# Patient Record
Sex: Male | Born: 1935 | Race: White | Hispanic: No | Smoking: Former smoker
Health system: Southern US, Community
[De-identification: ages and names within clinical notes are randomized; demographics above are authoritative.]

## PROBLEM LIST (undated history)

## (undated) DIAGNOSIS — N183 Chronic kidney disease, stage 3 unspecified: Secondary | ICD-10-CM

## (undated) DIAGNOSIS — C61 Malignant neoplasm of prostate: Secondary | ICD-10-CM

## (undated) DIAGNOSIS — R609 Edema, unspecified: Secondary | ICD-10-CM

## (undated) DIAGNOSIS — M1990 Unspecified osteoarthritis, unspecified site: Secondary | ICD-10-CM

## (undated) DIAGNOSIS — Z9289 Personal history of other medical treatment: Secondary | ICD-10-CM

## (undated) DIAGNOSIS — K589 Irritable bowel syndrome without diarrhea: Secondary | ICD-10-CM

## (undated) DIAGNOSIS — H9190 Unspecified hearing loss, unspecified ear: Secondary | ICD-10-CM

## (undated) DIAGNOSIS — F0390 Unspecified dementia without behavioral disturbance: Secondary | ICD-10-CM

## (undated) DIAGNOSIS — I10 Essential (primary) hypertension: Secondary | ICD-10-CM

## (undated) DIAGNOSIS — E785 Hyperlipidemia, unspecified: Secondary | ICD-10-CM

## (undated) DIAGNOSIS — K219 Gastro-esophageal reflux disease without esophagitis: Secondary | ICD-10-CM

## (undated) DIAGNOSIS — F411 Generalized anxiety disorder: Secondary | ICD-10-CM

## (undated) DIAGNOSIS — I872 Venous insufficiency (chronic) (peripheral): Secondary | ICD-10-CM

## (undated) DIAGNOSIS — I2510 Atherosclerotic heart disease of native coronary artery without angina pectoris: Secondary | ICD-10-CM

## (undated) DIAGNOSIS — I739 Peripheral vascular disease, unspecified: Secondary | ICD-10-CM

## (undated) DIAGNOSIS — R601 Generalized edema: Secondary | ICD-10-CM

## (undated) DIAGNOSIS — I5032 Chronic diastolic (congestive) heart failure: Secondary | ICD-10-CM

## (undated) HISTORY — PX: APPENDECTOMY: SHX54

## (undated) HISTORY — DX: Chronic kidney disease, stage 3 unspecified: N18.30

## (undated) HISTORY — DX: Malignant neoplasm of prostate: C61

## (undated) HISTORY — DX: Personal history of other medical treatment: Z92.89

## (undated) HISTORY — DX: Essential (primary) hypertension: I10

## (undated) HISTORY — DX: Hyperlipidemia, unspecified: E78.5

## (undated) HISTORY — DX: Chronic diastolic (congestive) heart failure: I50.32

## (undated) HISTORY — DX: Edema, unspecified: R60.9

## (undated) HISTORY — DX: Venous insufficiency (chronic) (peripheral): I87.2

## (undated) HISTORY — DX: Atherosclerotic heart disease of native coronary artery without angina pectoris: I25.10

## (undated) HISTORY — PX: INGUINAL HERNIA REPAIR: SUR1180

## (undated) HISTORY — DX: Generalized anxiety disorder: F41.1

## (undated) HISTORY — DX: Unspecified hearing loss, unspecified ear: H91.90

## (undated) HISTORY — DX: Peripheral vascular disease, unspecified: I73.9

## (undated) HISTORY — DX: Unspecified osteoarthritis, unspecified site: M19.90

## (undated) HISTORY — DX: Irritable bowel syndrome, unspecified: K58.9

## (undated) HISTORY — DX: Chronic kidney disease, stage 3 (moderate): N18.3

## (undated) HISTORY — DX: Gastro-esophageal reflux disease without esophagitis: K21.9

---

## 1992-07-19 HISTORY — PX: CARDIAC CATHETERIZATION: SHX172

## 1995-08-01 HISTORY — PX: CORONARY ARTERY BYPASS GRAFT: SHX141

## 1996-07-01 HISTORY — PX: CARDIAC CATHETERIZATION: SHX172

## 1998-04-13 ENCOUNTER — Ambulatory Visit (HOSPITAL_COMMUNITY): Admission: RE | Admit: 1998-04-13 | Discharge: 1998-04-13 | Payer: Self-pay | Admitting: Pulmonary Disease

## 1998-12-07 ENCOUNTER — Encounter: Admission: RE | Admit: 1998-12-07 | Discharge: 1999-03-07 | Payer: Self-pay | Admitting: Radiation Oncology

## 1999-02-25 ENCOUNTER — Ambulatory Visit (HOSPITAL_BASED_OUTPATIENT_CLINIC_OR_DEPARTMENT_OTHER): Admission: RE | Admit: 1999-02-25 | Discharge: 1999-02-25 | Payer: Self-pay | Admitting: Urology

## 1999-02-25 ENCOUNTER — Encounter: Payer: Self-pay | Admitting: Urology

## 1999-03-16 ENCOUNTER — Encounter: Payer: Self-pay | Admitting: Radiation Oncology

## 1999-03-16 ENCOUNTER — Ambulatory Visit (HOSPITAL_COMMUNITY): Admission: RE | Admit: 1999-03-16 | Discharge: 1999-03-16 | Payer: Self-pay | Admitting: Radiation Oncology

## 1999-03-28 ENCOUNTER — Encounter: Admission: RE | Admit: 1999-03-28 | Discharge: 1999-06-26 | Payer: Self-pay | Admitting: Radiation Oncology

## 1999-05-24 ENCOUNTER — Encounter: Payer: Self-pay | Admitting: Emergency Medicine

## 1999-05-24 ENCOUNTER — Observation Stay (HOSPITAL_COMMUNITY): Admission: EM | Admit: 1999-05-24 | Discharge: 1999-05-25 | Payer: Self-pay | Admitting: Emergency Medicine

## 1999-12-19 ENCOUNTER — Encounter: Payer: Self-pay | Admitting: Pulmonary Disease

## 1999-12-19 ENCOUNTER — Ambulatory Visit (HOSPITAL_COMMUNITY): Admission: RE | Admit: 1999-12-19 | Discharge: 1999-12-19 | Payer: Self-pay | Admitting: Pulmonary Disease

## 2001-02-07 ENCOUNTER — Ambulatory Visit (HOSPITAL_COMMUNITY): Admission: RE | Admit: 2001-02-07 | Discharge: 2001-02-07 | Payer: Self-pay | Admitting: Specialist

## 2001-02-07 ENCOUNTER — Encounter: Payer: Self-pay | Admitting: Specialist

## 2001-03-15 ENCOUNTER — Encounter: Payer: Self-pay | Admitting: Specialist

## 2001-03-22 ENCOUNTER — Encounter: Payer: Self-pay | Admitting: Specialist

## 2001-03-22 ENCOUNTER — Inpatient Hospital Stay (HOSPITAL_COMMUNITY): Admission: RE | Admit: 2001-03-22 | Discharge: 2001-03-23 | Payer: Self-pay | Admitting: Specialist

## 2004-06-15 ENCOUNTER — Ambulatory Visit: Payer: Self-pay | Admitting: Pulmonary Disease

## 2004-07-13 ENCOUNTER — Ambulatory Visit: Payer: Self-pay | Admitting: Pulmonary Disease

## 2005-03-24 ENCOUNTER — Ambulatory Visit: Payer: Self-pay | Admitting: Pulmonary Disease

## 2005-05-18 ENCOUNTER — Ambulatory Visit: Payer: Self-pay | Admitting: Internal Medicine

## 2005-11-17 ENCOUNTER — Ambulatory Visit: Payer: Self-pay | Admitting: Pulmonary Disease

## 2006-02-12 ENCOUNTER — Ambulatory Visit: Payer: Self-pay | Admitting: Pulmonary Disease

## 2006-03-20 ENCOUNTER — Ambulatory Visit: Payer: Self-pay | Admitting: Gastroenterology

## 2006-03-23 ENCOUNTER — Ambulatory Visit: Payer: Self-pay | Admitting: Gastroenterology

## 2006-05-16 ENCOUNTER — Ambulatory Visit: Payer: Self-pay | Admitting: Pulmonary Disease

## 2006-07-11 ENCOUNTER — Emergency Department (HOSPITAL_COMMUNITY): Admission: EM | Admit: 2006-07-11 | Discharge: 2006-07-11 | Payer: Self-pay | Admitting: Emergency Medicine

## 2006-08-07 ENCOUNTER — Ambulatory Visit: Payer: Self-pay | Admitting: Pulmonary Disease

## 2006-11-19 ENCOUNTER — Ambulatory Visit: Payer: Self-pay | Admitting: Pulmonary Disease

## 2006-11-22 ENCOUNTER — Ambulatory Visit: Payer: Self-pay | Admitting: Cardiology

## 2007-05-20 ENCOUNTER — Ambulatory Visit: Payer: Self-pay | Admitting: Pulmonary Disease

## 2007-08-13 DIAGNOSIS — I2510 Atherosclerotic heart disease of native coronary artery without angina pectoris: Secondary | ICD-10-CM | POA: Insufficient documentation

## 2007-08-13 DIAGNOSIS — E785 Hyperlipidemia, unspecified: Secondary | ICD-10-CM | POA: Insufficient documentation

## 2007-08-13 DIAGNOSIS — I10 Essential (primary) hypertension: Secondary | ICD-10-CM

## 2007-08-13 DIAGNOSIS — F411 Generalized anxiety disorder: Secondary | ICD-10-CM | POA: Insufficient documentation

## 2007-08-13 DIAGNOSIS — K589 Irritable bowel syndrome without diarrhea: Secondary | ICD-10-CM

## 2007-08-13 DIAGNOSIS — I739 Peripheral vascular disease, unspecified: Secondary | ICD-10-CM

## 2007-08-13 DIAGNOSIS — M1990 Unspecified osteoarthritis, unspecified site: Secondary | ICD-10-CM

## 2007-08-13 DIAGNOSIS — C61 Malignant neoplasm of prostate: Secondary | ICD-10-CM

## 2007-08-13 DIAGNOSIS — K219 Gastro-esophageal reflux disease without esophagitis: Secondary | ICD-10-CM

## 2008-01-30 ENCOUNTER — Encounter: Payer: Self-pay | Admitting: Pulmonary Disease

## 2008-02-26 ENCOUNTER — Telehealth (INDEPENDENT_AMBULATORY_CARE_PROVIDER_SITE_OTHER): Payer: Self-pay | Admitting: *Deleted

## 2008-02-28 ENCOUNTER — Encounter: Payer: Self-pay | Admitting: Pulmonary Disease

## 2008-03-31 ENCOUNTER — Ambulatory Visit: Payer: Self-pay | Admitting: Internal Medicine

## 2008-05-06 ENCOUNTER — Ambulatory Visit: Payer: Self-pay | Admitting: Pulmonary Disease

## 2008-05-19 ENCOUNTER — Ambulatory Visit: Payer: Self-pay | Admitting: Pulmonary Disease

## 2008-05-20 ENCOUNTER — Ambulatory Visit: Payer: Self-pay | Admitting: Pulmonary Disease

## 2008-05-24 DIAGNOSIS — I872 Venous insufficiency (chronic) (peripheral): Secondary | ICD-10-CM | POA: Insufficient documentation

## 2008-05-24 DIAGNOSIS — R413 Other amnesia: Secondary | ICD-10-CM | POA: Insufficient documentation

## 2008-05-24 LAB — CONVERTED CEMR LAB
ALT: 24 units/L (ref 0–53)
AST: 29 units/L (ref 0–37)
Albumin: 3.8 g/dL (ref 3.5–5.2)
Alkaline Phosphatase: 51 units/L (ref 39–117)
BUN: 18 mg/dL (ref 6–23)
Basophils Relative: 0.2 % (ref 0.0–3.0)
CO2: 32 meq/L (ref 19–32)
Chloride: 102 meq/L (ref 96–112)
Creatinine, Ser: 1.2 mg/dL (ref 0.4–1.5)
Eosinophils Relative: 1.1 % (ref 0.0–5.0)
HDL: 36.4 mg/dL — ABNORMAL LOW (ref >39.0)
LDL Cholesterol: 103 mg/dL — ABNORMAL HIGH (ref 0–99)
Lymphocytes Relative: 19.3 % (ref 12.0–46.0)
Neutrophils Relative %: 72.5 % (ref 43.0–77.0)
PSA: 0.73 ng/mL (ref 0.10–4.00)
Platelets: 194 10*3/uL (ref 150–400)
Potassium: 4 meq/L (ref 3.5–5.1)
RBC: 4.5 M/uL (ref 4.22–5.81)
Total Bilirubin: 0.9 mg/dL (ref 0.3–1.2)
Total CHOL/HDL Ratio: 4.3
VLDL: 17 mg/dL (ref 0–40)
WBC: 8.9 10*3/uL (ref 4.5–10.5)

## 2008-06-22 ENCOUNTER — Ambulatory Visit: Payer: Self-pay | Admitting: Internal Medicine

## 2008-09-25 ENCOUNTER — Encounter: Payer: Self-pay | Admitting: Pulmonary Disease

## 2009-03-22 ENCOUNTER — Encounter: Payer: Self-pay | Admitting: Pulmonary Disease

## 2009-03-22 ENCOUNTER — Encounter (INDEPENDENT_AMBULATORY_CARE_PROVIDER_SITE_OTHER): Payer: Self-pay | Admitting: *Deleted

## 2009-03-31 ENCOUNTER — Ambulatory Visit: Payer: Self-pay | Admitting: Pulmonary Disease

## 2009-04-01 ENCOUNTER — Ambulatory Visit: Payer: Self-pay | Admitting: Pulmonary Disease

## 2009-04-04 DIAGNOSIS — H9190 Unspecified hearing loss, unspecified ear: Secondary | ICD-10-CM

## 2009-04-07 LAB — CONVERTED CEMR LAB
AST: 27 units/L (ref 0–37)
Albumin: 4 g/dL (ref 3.5–5.2)
Alkaline Phosphatase: 51 units/L (ref 39–117)
Basophils Absolute: 0 10*3/uL (ref 0.0–0.1)
Bilirubin, Direct: 0.1 mg/dL (ref 0.0–0.3)
Calcium: 9.1 mg/dL (ref 8.4–10.5)
Eosinophils Absolute: 0.2 10*3/uL (ref 0.0–0.7)
GFR calc non Af Amer: 43.48 mL/min (ref >60)
Glucose, Bld: 105 mg/dL — ABNORMAL HIGH (ref 70–99)
HCT: 46.5 % (ref 39.0–52.0)
HDL: 35.5 mg/dL — ABNORMAL LOW (ref >39.00)
Hemoglobin: 15.7 g/dL (ref 13.0–17.0)
Lymphs Abs: 2.1 10*3/uL (ref 0.7–4.0)
MCHC: 33.8 g/dL (ref 30.0–36.0)
MCV: 95.8 fL (ref 78.0–100.0)
Monocytes Absolute: 0.7 10*3/uL (ref 0.1–1.0)
Monocytes Relative: 7.7 % (ref 3.0–12.0)
Neutro Abs: 5.9 10*3/uL (ref 1.4–7.7)
Platelets: 217 10*3/uL (ref 150.0–400.0)
Potassium: 4.1 meq/L (ref 3.5–5.1)
RDW: 12.5 % (ref 11.5–14.6)
Sodium: 141 meq/L (ref 135–145)
TSH: 1.33 microintl units/mL (ref 0.35–5.50)
Total Bilirubin: 1 mg/dL (ref 0.3–1.2)
Total CHOL/HDL Ratio: 6
Triglycerides: 79 mg/dL (ref 0.0–149.0)

## 2009-04-28 ENCOUNTER — Ambulatory Visit: Payer: Self-pay | Admitting: Pulmonary Disease

## 2009-10-07 ENCOUNTER — Encounter: Payer: Self-pay | Admitting: Pulmonary Disease

## 2009-10-08 ENCOUNTER — Telehealth: Payer: Self-pay | Admitting: Pulmonary Disease

## 2009-10-26 ENCOUNTER — Encounter: Payer: Self-pay | Admitting: Pulmonary Disease

## 2009-11-11 ENCOUNTER — Encounter: Payer: Self-pay | Admitting: Pulmonary Disease

## 2010-05-31 ENCOUNTER — Telehealth (INDEPENDENT_AMBULATORY_CARE_PROVIDER_SITE_OTHER): Payer: Self-pay | Admitting: *Deleted

## 2010-06-16 ENCOUNTER — Telehealth: Payer: Self-pay | Admitting: Pulmonary Disease

## 2010-08-16 ENCOUNTER — Ambulatory Visit
Admission: RE | Admit: 2010-08-16 | Discharge: 2010-08-16 | Payer: Self-pay | Source: Home / Self Care | Attending: Pulmonary Disease | Admitting: Pulmonary Disease

## 2010-08-17 ENCOUNTER — Other Ambulatory Visit: Payer: Self-pay | Admitting: Pulmonary Disease

## 2010-08-17 ENCOUNTER — Ambulatory Visit
Admission: RE | Admit: 2010-08-17 | Discharge: 2010-08-17 | Payer: Self-pay | Source: Home / Self Care | Attending: Pulmonary Disease | Admitting: Pulmonary Disease

## 2010-08-17 LAB — CBC WITH DIFFERENTIAL/PLATELET
Basophils Absolute: 0 10*3/uL (ref 0.0–0.1)
Basophils Relative: 0.2 % (ref 0.0–3.0)
Eosinophils Absolute: 0.2 10*3/uL (ref 0.0–0.7)
Eosinophils Relative: 2.7 % (ref 0.0–5.0)
HCT: 44 % (ref 39.0–52.0)
Hemoglobin: 15 g/dL (ref 13.0–17.0)
Lymphocytes Relative: 26.9 % (ref 12.0–46.0)
Lymphs Abs: 2.3 10*3/uL (ref 0.7–4.0)
MCHC: 34.2 g/dL (ref 30.0–36.0)
MCV: 95.5 fl (ref 78.0–100.0)
Monocytes Absolute: 0.7 10*3/uL (ref 0.1–1.0)
Monocytes Relative: 8.4 % (ref 3.0–12.0)
Neutro Abs: 5.2 10*3/uL (ref 1.4–7.7)
Neutrophils Relative %: 61.8 % (ref 43.0–77.0)
Platelets: 189 10*3/uL (ref 150.0–400.0)
RBC: 4.61 Mil/uL (ref 4.22–5.81)
RDW: 13.4 % (ref 11.5–14.6)
WBC: 8.5 10*3/uL (ref 4.5–10.5)

## 2010-08-17 LAB — BASIC METABOLIC PANEL
BUN: 22 mg/dL (ref 6–23)
CO2: 29 mEq/L (ref 19–32)
Calcium: 9.1 mg/dL (ref 8.4–10.5)
Chloride: 104 mEq/L (ref 96–112)
Creatinine, Ser: 1.6 mg/dL — ABNORMAL HIGH (ref 0.4–1.5)
GFR: 44.3 mL/min — ABNORMAL LOW (ref >60.00)
Glucose, Bld: 91 mg/dL (ref 70–99)
Potassium: 4 mEq/L (ref 3.5–5.1)
Sodium: 142 mEq/L (ref 135–145)

## 2010-08-17 LAB — HEPATIC FUNCTION PANEL
ALT: 23 U/L (ref 0–53)
AST: 33 U/L (ref 0–37)
Albumin: 4 g/dL (ref 3.5–5.2)
Alkaline Phosphatase: 46 U/L (ref 39–117)
Bilirubin, Direct: 0.2 mg/dL (ref 0.0–0.3)
Total Bilirubin: 1.1 mg/dL (ref 0.3–1.2)
Total Protein: 7 g/dL (ref 6.0–8.3)

## 2010-08-17 LAB — LIPID PANEL
Cholesterol: 164 mg/dL (ref 0–200)
HDL: 38.3 mg/dL — ABNORMAL LOW (ref >39.00)
LDL Cholesterol: 104 mg/dL — ABNORMAL HIGH (ref 0–99)
Total CHOL/HDL Ratio: 4
Triglycerides: 108 mg/dL (ref 0.0–149.0)
VLDL: 21.6 mg/dL (ref 0.0–40.0)

## 2010-08-17 LAB — TSH: TSH: 1.41 u[IU]/mL (ref 0.35–5.50)

## 2010-08-17 LAB — PSA: PSA: 1.23 ng/mL (ref 0.10–4.00)

## 2010-08-30 NOTE — Progress Notes (Signed)
Summary: requesting order for PT  Phone Note Other Incoming Call back at [phone]   Caller: Misty Stanley with [facility] Summary of Call: [phone] Judie Petit with [facility] is in home with Mrs. Billy Morgan Miss. Mr [name] has an appt with Dr. Kriste Basque on 11/08/09. Requesting PT for pt b/c of weakness and unsteady gait.  May I have an order for [facility] to go out and work with pt? Initial call taken by: Alfonso Ramus,  October 08, 2009 9:09 AM  Follow-up for Phone Call        SN said he took care of this.  you have those papers?  thanks Randell Loop CMA  October 08, 2009 1:59 PM   Misty Stanley with [facility] Notified. Alfonso Ramus  October 08, 2009 2:06 PM  Form faxed to Misty Stanley at [facility] and sent down to scan. Alfonso Ramus  October 08, 2009 2:06 PM       Appended Document: requesting order for PT Authorization number for 60 day [id number]

## 2010-08-30 NOTE — Progress Notes (Signed)
Summary: refill  Phone Note Call from Patient   Caller: Billy Morgan Call For: nadel Summary of Call: needs refill of alprazolam- [facility] [address] [place] # [phone] Initial call taken by: Tivis Ringer, CNA,  May 31, 2010 9:22 AM    Prescriptions: ALPRAZOLAM 0.25 MG  TABS (ALPRAZOLAM) take 1/2-1 tablet by mouth three times a day as needed  #100 x 1   Entered by:   Abigail Miyamoto RN   Authorized by:   Michele Mcalpine MD   Signed by:   Abigail Miyamoto RN on 05/31/2010   Method used:   Telephoned to ...       [facility]  [address] [id number]* (retail)       [address]       [place], [place]  [address]       Ph: [phone] or [phone]       Fax: [phone]   RxID:   [id number]   Appended Document: refill Spoke with pt's son.  Appt made for pt to see Dr Kriste Basque on 08-01-10.  REfill for Xanax given until appt.  Stressed importance of pt keeping this appt.  Pt's son verbalized understanding.

## 2010-08-30 NOTE — Miscellaneous (Signed)
Summary: Plan of Care & Treatment/[facility]  Plan of Care & Treatment/[facility]   Imported By: Sherian Rein 11/01/2009 11:15:15  _____________________________________________________________________  External Attachment:    Type:   Image     Comment:   External Document

## 2010-08-30 NOTE — Miscellaneous (Signed)
Summary: PT Order/[facility]  PT Order/[facility]   Imported By: Sherian Rein 10/22/2009 11:37:46  _____________________________________________________________________  External Attachment:    Type:   Image     Comment:   External Document

## 2010-08-30 NOTE — Miscellaneous (Signed)
Summary: Episode Summary Report/[facility]  Episode Summary Report/[facility]   Imported By: Sherian Rein 11/23/2009 08:24:11  _____________________________________________________________________  External Attachment:    Type:   Image     Comment:   External Document

## 2010-08-30 NOTE — Progress Notes (Signed)
Summary: refill on diclofenac, felodipine  Phone Note Call from Patient Call back at [facility] Phone [phone]   Caller: Patient Call For: nadel Reason for Call: Refill Medication Summary of Call: Patient needing refills--diclofenac and felodipine.  Has an appt on Jan.2.  [facility] [address] Initial call taken by: Lehman Prom,  June 16, 2010 10:56 AM  Follow-up for Phone Call        spoke with pt and advsie refill sents. pt aware. Carron Curie CMA  June 16, 2010 11:05 AM     Prescriptions: DICLOFENAC SODIUM 50 MG TBEC (DICLOFENAC SODIUM) 1 by mouth two times a day as needed for arthritis pain...  #60 Tablet x 1   Entered by:   Carron Curie CMA   Authorized by:   Michele Mcalpine MD   Signed by:   Carron Curie CMA on 06/16/2010   Method used:   Electronically to        [facility] [address] [id number]* (retail)       [address]       [place], [place]  [address]       Ph: [phone] or [phone]       Fax: [phone]   RxID:   [id number] FELODIPINE 10 MG  TB24 (FELODIPINE) 1 by mouth once daily  #30 Tablet x 2   Entered by:   Carron Curie CMA   Authorized by:   Michele Mcalpine MD   Signed by:   Carron Curie CMA on 06/16/2010   Method used:   Electronically to        [facility] [address] [id number]* (retail)       [address]       [place], [place]  [address]       Ph: [phone] or [phone]       Fax: [phone]   RxID:   [id number]

## 2010-09-01 ENCOUNTER — Telehealth: Payer: Self-pay | Admitting: Pulmonary Disease

## 2010-09-01 NOTE — Assessment & Plan Note (Signed)
Summary: ROV for med refills/ddp   CC:  16 month ROV & review of mult medical prblems....  History of Present Illness: 77 y/o WM here for a follow up visit... he has multiple medical problems as noted below...    ~  March 31, 2009:  he denies new problems or concerns, see problem list below... he feels that he is doing well, wants refill perscriptions & due for f/u labs...   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 pt has no new complaints or concerns... +HOH;  BP controlled on meds;  denies CP, palpit, SOB, edema, etc;  Chol looks good on Simva40;  GI & GU are stable;  DJD is mod & well managed by the diclofenac...     Current ProblemList:   HEARING LOSS (ICD-389.9) - he has hearing aides...  HYPERTENSION (ICD-401.9) - on METOPROLOL 50mg Bid,  FELODIPINE 10mg /d,  TORSEMIDE 20mg /d... BP today = 120/60 & similar at home, takes meds regularly & tolerates well... denies HA, visual changes, CP, palipit, dizziness, syncope, dyspnea, edema, etc...   ATHEROSCLEROTIC HEART DISEASE (ICD-414.00) - on ASA 81mg /d... followed by [facility] for Cards... he had CABG x5 in 1997 by DrBartle... denies CP/ angina, palpit, SOB, etc...  ~  NuclearStressTest 7/07 was neg- no ischemia or infarct... non-gated due to ectopy.  ~  2DEcho 11/07 showed norm LV wall thickness & LVF, mild RA & LA dil, mild MR...  PERIPHERAL VASCULAR DISEASE (ICD-443.9) - on ASA 81mg /d and followed by [facility] as noted...  ~  Abd Sonar 5/01 showed tortuous Ao w/ mod atherosclerotic changes, no aneurysm.  VENOUS INSUFFICIENCY (ICD-459.81)  HYPERLIPIDEMIA (ICD-272.4) - on SIMVASTATIN 40mg /d now...  ~  FLP 4/07 on Simva20 showed TChol 123, TG 62, HDL 35, LDL 76... rec- continue same.  ~  FLP 10/09 on Simva20 showed TChol 156, TG 84, HDL 36, LDL  103  ~  FLP 9//10 on GUYQI34 showed TChol 227, TG 79, HDL 36, LDL 176... ?taking regularly? incr Simva40.  ~  FLP 1/12 on Simva40 showed TChol 164, TG 108, HDL 38, LDL 104  GERD (ICD-530.81) - uses OTC meds Prn... denies nausea, vomiting, heartburn, diarrhea, constipation, blood in stool, abdominal pain, swelling, gas...   IRRITABLE BOWEL SYNDROME (ICD-564.1) - on MIRALAX Prn...  ~  last colonoscopy 8/07 by Dr Arlyce Dice showed divertics only...  PROSTATE CANCER (ICD-185) & RENAL INSUFFICIENCY (ICD-588.9) - he had radium seed implants 7/00 & is followed by Joneen Boers- we don't have any recent notes...  ~  labs here 8/06 showed PSA = 0.11  ~  labs 10/09 showed PSA= 0.73... BUN= 18, Creat= 1.2  ~  labs 9/10 showed PSA= 1.10... slowly increasing PSA may mean recurrent Ca- rec> f/u w/ Urology.  ~  labs 1/12 showed PSA= 1.23... BUN= 22, Creat= 1.6  DEGENERATIVE JOINT DISEASE (ICD-715.90) - on DICLOFENAC 50mg Bid.. he sees DrAplington/ Contractor at [facility]...  MEMORY LOSS (ICD-780.93) - he notes prob w/ memory... "I don't drive at night" (he got lost and son had to get him)... MMSE= 28/30 last yr...  ~  CT Brain 4/08 showed atrophy & sm vessel dis, no acute changes...  ANXIETY (ICD-300.00) - on ALPRAZOLAM 0.25mg  Prn...   Preventive Screening-Counseling & Management  Alcohol-Tobacco     Smoking Status:  never  Allergies: 1)  Sulfamethoxazole (Sulfamethoxazole) 2)  * Trimpex (Trimethoprim)  Comments:  Nurse/Medical Assistant: The patient's medications and allergies were reviewed with the patient and were updated in the Medication and Allergy Lists.  Past History:  Past Medical History: HEARING LOSS (ICD-389.9) HYPERTENSION (ICD-401.9) ATHEROSCLEROTIC HEART DISEASE (ICD-414.00) PERIPHERAL VASCULAR DISEASE (ICD-443.9) VENOUS INSUFFICIENCY (ICD-459.81) HYPERLIPIDEMIA (ICD-272.4) GERD (ICD-530.81) IRRITABLE BOWEL SYNDROME (ICD-564.1) PROSTATE CANCER (ICD-185) RENAL INSUFFICIENCY  (ICD-588.9) DEGENERATIVE JOINT DISEASE (ICD-715.90) MEMORY LOSS (ICD-780.93) ANXIETY (ICD-300.00)  Past Surgical History: S/P bilat inguinal hernia repairs S/P appendectomy S/P CABG x5 in 1997 by DrBartle S/P radium seed implants for prostate cancer  Family History: Reviewed history and no changes required.  Social History: Reviewed history and no changes required.  Review of Systems      See HPI       The patient complains of malaise, decreased hearing, dyspnea on exertion, constipation, nocturia, joint pain, stiffness, arthritis, and anxiety.  The patient denies fever, chills, sweats, anorexia, fatigue, weakness, weight loss, sleep disorder, blurring, diplopia, eye irritation, eye discharge, vision loss, eye pain, photophobia, earache, ear discharge, tinnitus, nasal congestion, nosebleeds, sore throat, hoarseness, chest pain, palpitations, syncope, orthopnea, PND, peripheral edema, cough, dyspnea at rest, excessive sputum, hemoptysis, wheezing, pleurisy, nausea, vomiting, diarrhea, abdominal pain, melena, hematochezia, jaundice, gas/bloating, indigestion/heartburn, dysphagia, odynophagia, dysuria, hematuria, urinary frequency, urinary hesitancy, incontinence, back pain, joint swelling, muscle cramps, muscle weakness, sciatica, restless legs, leg pain at night, leg pain with exertion, rash, itching, dryness, suspicious lesions, paralysis, paresthesias, seizures, tremors, vertigo, transient blindness, frequent falls, frequent headaches, difficulty walking, depression, memory loss, confusion, cold intolerance, heat intolerance, polydipsia, polyphagia, polyuria, unusual weight change, abnormal bruising, bleeding, enlarged lymph nodes, urticaria, allergic rash, hay fever, and recurrent infections.    Vital Signs:  Patient profile:   77 year old male Height:      72 inches Weight:      182.13 pounds BMI:     24.79 O2 Sat:      97 % on Room air Temp:     97.8 degrees F oral Pulse rate:   90  / minute BP sitting:   120 / 60  (left arm) Cuff size:   regular  Vitals Entered By: Randell Loop CMA (August 16, 2010 2:54 PM)  O2 Sat at Rest %:  97 O2 Flow:  Room air CC: 16 month ROV & review of mult medical prblems... Is Patient Diabetic? No Pain Assessment Patient in pain? no      Comments no changes in meds today   Physical Exam  Additional Exam:  WD, WN, 77 y/o WM in NAD... GENERAL:  Alert & oriented; pleasant & cooperative... HEENT:  [place]/AT, EOM-wnl, PERRLA, EACs-clear, TMs-wnl, NOSE-clear, THROAT-clear & wnl. NECK:  Supple w/ fairROM; no JVD; normal carotid impulses w/o bruits; no thyromegaly or nodules palpated; no lymphadenopathy. CHEST:  Clear to P & A; without wheezes/ rales/ or rhonchi. HEART:  Regular Rhythm; without murmurs/ rubs/ or gallops. ABDOMEN:  Soft w/ normal bowel sounds; non-tender, no organomegaly or masses detected. EXT: without deformities, mild arthritic changes; no varicose veins/ +venous insuffic/ tr edema. NEURO:  CN's intact;  no focal neuro deficits... DERM:  No lesions noted; no rash etc...    MISC. Report  Procedure date:  08/17/2010  Findings:      Lipid Panel (LIPID)   Cholesterol               164 mg/dL  0-200   Triglycerides             108.0 mg/dL                 6.0-454.0   HDL                  [L]  98.11 mg/dL                 >91.47   LDL Cholesterol      [H]  829 mg/dL                   5-62  BMP (METABOL)   Sodium                    142 mEq/L                   135-145   Potassium                 4.0 mEq/L                   3.5-5.1   Chloride                  104 mEq/L                   96-112   Carbon Dioxide            29 mEq/L                    19-32   Glucose                   91 mg/dL                    13-08   BUN                       22 mg/dL                    6-57   Creatinine           [H]  1.6 mg/dL                   8.4-6.9   Calcium                   9.1 mg/dL                   6.2-95.2    GFR                  [L]  44.30 mL/min                >60.00  Hepatic/Liver Function Panel (HEPATIC)   Total Bilirubin           1.1 mg/dL                   8.4-1.3   Direct Bilirubin          0.2 mg/dL                   2.4-4.0   Alkaline Phosphatase      46 U/L                      39-117   AST  33 U/L                      0-37   ALT                       23 U/L                      0-53   Total Protein             7.0 g/dL                    2.1-3.0   Albumin                   4.0 g/dL                    8.6-5.7  Comments:      CBC Platelet w/Diff (CBCD)   White Cell Count          8.5 K/uL                    4.5-10.5   Red Cell Count            4.61 Mil/uL                 4.22-5.81   Hemoglobin                15.0 g/dL                   84.6-96.2   Hematocrit                44.0 %                      39.0-52.0   MCV                       95.5 fl                     78.0-100.0   Platelet Count            189.0 K/uL                  150.0-400.0   Neutrophil %              61.8 %                      43.0-77.0   Lymphocyte %              26.9 %                      12.0-46.0   Monocyte %                8.4 %                       3.0-12.0   Eosinophils%              2.7 %                       0.0-5.0   Basophils %               0.2 %  0.0-3.0  TSH (TSH)   FastTSH                   1.41 uIU/mL                 0.35-5.50   Prostate Specific Antigen (PSA)   PSA-Hyb                   1.23 ng/mL                  0.10-4.00   Impression & Recommendations:  Problem # 1:  HEARING LOSS (ICD-389.9) Aware>  he has hearing aides but doesn't like them much...  Problem # 2:  HYPERTENSION (ICD-401.9) BP controlled>  same meds. His updated medication list for this problem includes:    Metoprolol Tartrate 50 Mg Tabs (Metoprolol tartrate) .Marland Kitchen... 1 by mouth two times a day    Felodipine 10 Mg Tb24 (Felodipine) .Marland Kitchen... 1 by mouth once daily    Torsemide  20 Mg Tabs (Torsemide) .Marland Kitchen... Take 1  by mouth once daily  Problem # 3:  ATHEROSCLEROTIC HEART DISEASE (ICD-414.00) Stable w/o angina, palpit, SOB, periph vasc symptoms, etc... His updated medication list for this problem includes:    Aspirin 81 Mg Tbec (Aspirin) .Marland Kitchen... 1 by mouth once daily    Metoprolol Tartrate 50 Mg Tabs (Metoprolol tartrate) .Marland Kitchen... 1 by mouth two times a day    Felodipine 10 Mg Tb24 (Felodipine) .Marland Kitchen... 1 by mouth once daily    Torsemide 20 Mg Tabs (Torsemide) .Marland Kitchen... Take 1  by mouth once daily  Problem # 4:  HYPERLIPIDEMIA (ICD-272.4) FLP stable on the Simva40... His updated medication list for this problem includes:    Simvastatin 40 Mg Tabs (Simvastatin) .Marland Kitchen... Take one tablet by mouth at bedtime  Problem # 5:  GERD (ICD-530.81) GI is stable>  continue same Rx.  Problem # 6:  PROSTATE CANCER (ICD-185) GU is stable>  he is asymptomatic but PSA slowly incr s/p radium seeds... now at 1.23 w/ min incr over the last 1.43yrs therefore rec continued observation... he is encouraged to f/u w/ Urology...  Problem # 7:  DEGENERATIVE JOINT DISEASE (ICD-715.90) Diclofenac helps & OK to continue Prn dosing... His updated medication list for this problem includes:    Aspirin 81 Mg Tbec (Aspirin) .Marland Kitchen... 1 by mouth once daily    Diclofenac Sodium 50 Mg Tbec (Diclofenac sodium) .Marland Kitchen... 1 by mouth two times a day as needed for arthritis pain...  Problem # 8:  OTHER MEDICAL PROBLEMS AS NOTED>>>  Complete Medication List: 1)  Aspirin 81 Mg Tbec (Aspirin) .Marland Kitchen.. 1 by mouth once daily 2)  Metoprolol Tartrate 50 Mg Tabs (Metoprolol tartrate) .Marland Kitchen.. 1 by mouth two times a day 3)  Felodipine 10 Mg Tb24 (Felodipine) .Marland Kitchen.. 1 by mouth once daily 4)  Torsemide 20 Mg Tabs (Torsemide) .... Take 1  by mouth once daily 5)  Simvastatin 40 Mg Tabs (Simvastatin) .... Take one tablet by mouth at bedtime 6)  Miralax Powd (Polyethylene glycol 3350) .... Use as directed 7)  Diclofenac Sodium 50 Mg Tbec  (Diclofenac sodium) .Marland Kitchen.. 1 by mouth two times a day as needed for arthritis pain.Marland KitchenMarland Kitchen 8)  Alprazolam 0.25 Mg Tabs (Alprazolam) .... Take 1/2-1 tablet by mouth three times a day as needed  Patient Instructions: 1)  Today we updated your med list- see below.... 2)  We refilled your meds for 2012... 3)  Please come to our lab one morning this week for  your FASTING blood work...  then please call the "phone tree" in a few days for your lab results.Marland KitchenMarland Kitchen 4)  Stay as active as possible, and be as careful as possible > NO FALLING ALLOWED! and careful w/ lifting etc... 5)  Call for any problems.Marland KitchenMarland Kitchen 6)  Please schedule a follow-up appointment in 1 year, sooner as needed. Prescriptions: ALPRAZOLAM 0.25 MG  TABS (ALPRAZOLAM) take 1/2-1 tablet by mouth three times a day as needed  #100 x 4   Entered and Authorized by:   Michele Mcalpine MD   Signed by:   Michele Mcalpine MD on 08/16/2010   Method used:   Print then Give to Patient   RxID:   [id number] DICLOFENAC SODIUM 50 MG TBEC (DICLOFENAC SODIUM) 1 by mouth two times a day as needed for arthritis pain...  #180 x 4   Entered and Authorized by:   Michele Mcalpine MD   Signed by:   Michele Mcalpine MD on 08/16/2010   Method used:   Print then Give to Patient   RxID:   [id number] SIMVASTATIN 40 MG TABS (SIMVASTATIN) take one tablet by mouth at bedtime  #90 x 4   Entered and Authorized by:   Michele Mcalpine MD   Signed by:   Michele Mcalpine MD on 08/16/2010   Method used:   Print then Give to Patient   RxID:   [id number] TORSEMIDE 20 MG  TABS (TORSEMIDE) take 1  by mouth once daily  #90 x 4   Entered and Authorized by:   Michele Mcalpine MD   Signed by:   Michele Mcalpine MD on 08/16/2010   Method used:   Print then Give to Patient   RxID:   [id number] FELODIPINE 10 MG  TB24 (FELODIPINE) 1 by mouth once daily  #90 x 4   Entered and Authorized by:   Michele Mcalpine MD   Signed by:   Michele Mcalpine MD on 08/16/2010   Method used:   Print then Give to  Patient   RxID:   [id number] METOPROLOL TARTRATE 50 MG TABS (METOPROLOL TARTRATE) 1 by mouth two times a day  #180 x 4   Entered and Authorized by:   Michele Mcalpine MD   Signed by:   Michele Mcalpine MD on 08/16/2010   Method used:   Print then Give to Patient   RxID:   [id number]    Immunization History:  Influenza Immunization History:    Influenza:  historical (05/16/2010)

## 2010-09-07 NOTE — Progress Notes (Signed)
Summary: chest discomfort son and tightness in chest  Phone Note Call from Patient   Caller: Son Call For: Billy Morgan Summary of Call: Patients son Billy Morgan phoned and wanted to speak to a nurse regarding, they put him metoprolol and he took his first dose on Saturday, he had some discomfort and sob and tightness in chest. He wants to know if this is a side of the meds of if he should take him to the hospital. Billy Morgan states that his dad is calm sitting in a chair with a heating pad and son doesnt think that he is having a heart attack. Stated he had some chest discomfort last night.  Billy Morgan can be reached at [phone]  Initial call taken by: Vedia Coffer,  September 01, 2010 11:45 AM  Follow-up for Phone Call        Pt's son states the pt started metoprolol on Sat., 09/27/2010 and has noticed since then chest discomfort, incr SOB w/ exert and chest tightness. He denies any congestion and says he has no pain or discomfort in his arms, neck or jaw. Pt's SOB is better if he is resting and chest discomfort eases but still present. Son advised to take pt to the closest ER or call 911 if signs's get worse. Will send to [facility] for recs regarding metoprolol use. Follow-up by: Michel Bickers CMA,  September 01, 2010 11:58 AM  Additional Follow-up for Phone Call Additional follow up Details #1::        called and spoke with pts son--Billy Morgan and he stated that he didnt think pt had been taking this med but did start on the metoprolol 50mg  two times a day on sat--now pt is having some cp and itching on his back with no rash--- and this med was last filled back in 2009---so per SN---pt will need to take the metoprolol 50mg    1/2 tab two times a day and the pts son will call and schedule an appt to see Dr. Tresa Endo for follow up.  son will call for any other problems Randell Loop CMA  September 01, 2010 4:27 PM     New/Updated Medications: METOPROLOL TARTRATE 50 MG TABS (METOPROLOL TARTRATE) 1/2  by mouth two times a day

## 2010-09-14 ENCOUNTER — Encounter: Payer: Self-pay | Admitting: Pulmonary Disease

## 2010-09-21 ENCOUNTER — Encounter: Payer: Self-pay | Admitting: Pulmonary Disease

## 2010-09-30 ENCOUNTER — Ambulatory Visit (INDEPENDENT_AMBULATORY_CARE_PROVIDER_SITE_OTHER): Payer: Medicare PPO | Admitting: Adult Health

## 2010-09-30 ENCOUNTER — Encounter: Payer: Self-pay | Admitting: Adult Health

## 2010-09-30 DIAGNOSIS — J069 Acute upper respiratory infection, unspecified: Secondary | ICD-10-CM

## 2010-10-11 NOTE — Letter (Signed)
Summary: [facility]   Imported By: Sherian Rein 10/04/2010 15:19:58  _____________________________________________________________________  External Attachment:    Type:   Image     Comment:   External Document

## 2010-10-11 NOTE — Assessment & Plan Note (Signed)
Summary: Acute NP office visit - bronchitis   CC:  prod cough with yellow mucus, increased SOB, tightness in chest  x3days - denies wheezing, and f/c/s.  History of Present Illness: 77 year old male with known history of HTN, DJD, Hyperlipdemia and Anxiety .  March 31, 2008- Presents for follow up and med review. Since last office visit, , reports doing well. Takes care of wife at home, who is wheelchair bound.    June 22, 2008--Complains of 4 days of lower abdominal pain/groin pain. Happened immediately after pulling up wife off floor and sitting her on bed. Felt something pull in low abd/groin where he had hernia repair before. Sore to touch. Denies chest pain, dyspnea, orthopnea, hemoptysis, fever, n/v/d, edema, urinay difficulties.   September 30, 2010 Presents for an acute office visit. Complains of productive cough with  with yellow mucus, increased SOB, tightness in chest  x3days. He is with family today. He is under alot of stress lately. Undergoing cardiac workup with recent stress test and echo. His wife is in the hospital with CVA and PNA.  Cough is not better with otc meds. Worse at night. Denies chest pain,  orthopnea, hemoptysis, fever, n/v/d, edema, headache.    Medications Prior to Update: 1)  Aspirin 81 Mg  Tbec (Aspirin) .Marland Kitchen.. 1 By Mouth Once Daily 2)  Metoprolol Tartrate 50 Mg Tabs (Metoprolol Tartrate) .... 1/2  By Mouth Two Times A Day 3)  Felodipine 10 Mg  Tb24 (Felodipine) .Marland Kitchen.. 1 By Mouth Once Daily 4)  Torsemide 20 Mg  Tabs (Torsemide) .... Take 1  By Mouth Once Daily 5)  Simvastatin 40 Mg Tabs (Simvastatin) .... Take One Tablet By Mouth At Bedtime 6)  Miralax   Powd (Polyethylene Glycol 3350) .... Use As Directed 7)  Diclofenac Sodium 50 Mg Tbec (Diclofenac Sodium) .Marland Kitchen.. 1 By Mouth Two Times A Day As Needed For Arthritis Pain... 8)  Alprazolam 0.25 Mg  Tabs (Alprazolam) .... Take 1/2-1 Tablet By Mouth Three Times A Day As Needed  Current Medications (verified): 1)   Aspirin 81 Mg  Tbec (Aspirin) .Marland Kitchen.. 1 By Mouth Once Daily 2)  Metoprolol Tartrate 50 Mg Tabs (Metoprolol Tartrate) .... 1/2  By Mouth Two Times A Day 3)  Felodipine 10 Mg  Tb24 (Felodipine) .Marland Kitchen.. 1 By Mouth Once Daily 4)  Torsemide 20 Mg  Tabs (Torsemide) .... Take 1 Tablet By Mouth Two Times A Day 5)  Simvastatin 40 Mg Tabs (Simvastatin) .... Take One Tablet By Mouth At Bedtime 6)  Miralax   Powd (Polyethylene Glycol 3350) .... Use As Directed 7)  Diclofenac Sodium 50 Mg Tbec (Diclofenac Sodium) .Marland Kitchen.. 1 By Mouth Two Times A Day As Needed For Arthritis Pain... 8)  Alprazolam 0.25 Mg  Tabs (Alprazolam) .... Take 1/2-1 Tablet By Mouth Three Times A Day As Needed 9)  Nitrostat 0.4 Mg Subl (Nitroglycerin) .Marland Kitchen.. 1 Under Tongue Every 5 Minutes X3 As Needed Chest Pain  Allergies (verified): 1)  Sulfamethoxazole (Sulfamethoxazole) 2)  * Trimpex (Trimethoprim)  Past History:  Past Medical History: Last updated: 08/16/2010 HEARING LOSS (ICD-389.9) HYPERTENSION (ICD-401.9) ATHEROSCLEROTIC HEART DISEASE (ICD-414.00) PERIPHERAL VASCULAR DISEASE (ICD-443.9) VENOUS INSUFFICIENCY (ICD-459.81) HYPERLIPIDEMIA (ICD-272.4) GERD (ICD-530.81) IRRITABLE BOWEL SYNDROME (ICD-564.1) PROSTATE CANCER (ICD-185) RENAL INSUFFICIENCY (ICD-588.9) DEGENERATIVE JOINT DISEASE (ICD-715.90) MEMORY LOSS (ICD-780.93) ANXIETY (ICD-300.00)  Past Surgical History: Last updated: 08/16/2010 S/P bilat inguinal hernia repairs S/P appendectomy S/P CABG x5 in 1997 by DrBartle S/P radium seed implants for prostate cancer  Risk Factors: Smoking  Status: never (08/16/2010)  Review of Systems      See HPI  Vital Signs:  Patient profile:   77 year old male Height:      72 inches Weight:      182.38 pounds BMI:     24.82 O2 Sat:      97 % on Room air Temp:     97.2 degrees F oral Pulse rate:   74 / minute BP sitting:   108 / 56  (right arm) Cuff size:   regular  Vitals Entered By: Boone Master CNA/MA (September 30, 2010  3:30 PM)  O2 Flow:  Room air CC: prod cough with yellow mucus, increased SOB, tightness in chest  x3days - denies wheezing, f/c/s Is Patient Diabetic? No Comments Medications reviewed with patient Daytime contact number verified with patient. Boone Master CNA/MA  September 30, 2010 3:30 PM    Physical Exam  Additional Exam:  WD, WN, 77 y/o WM in NAD... GENERAL:  Alert & oriented; pleasant & cooperative... HEENT:  [place]/AT, , EACs-clear, TMs-wnl, NOSE-clear drainage , THROAT-clear & wnl. NECK:  Supple w/ fairROM; no JVD; normal carotid impulses w/o bruits; no thyromegaly or nodules palpated; no lymphadenopathy. CHEST:  Coarse BS w/ no wheezing  HEART:  Regular Rhythm; without murmurs/ rubs/ or gallops. ABDOMEN:  Soft w/ normal bowel sounds; non-tender, no organomegaly or masses detected. EXT: without deformities, mild arthritic changes; no varicose veins/ +venous insuffic/ tr edema.     Impression & Recommendations:  Problem # 1:  UPPER RESPIRATORY INFECTION, ACUTE (ICD-465.9)  Augmentin 875mg  two times a day for 7 days Mucinex DM two times a day as needed  cough /congestion  Fluids and rest  Please contact office for sooner follow up if symptoms do not improve or worsen  follow up up Dr. Kriste Basque 3-4 months and as needed  His updated medication list for this problem includes:    Aspirin 81 Mg Tbec (Aspirin) .Marland Kitchen... 1 by mouth once daily    Diclofenac Sodium 50 Mg Tbec (Diclofenac sodium) .Marland Kitchen... 1 by mouth two times a day as needed for arthritis pain...  Orders: Est. Patient Level III (13086)  Medications Added to Medication List This Visit: 1)  Torsemide 20 Mg Tabs (Torsemide) .... Take 1 tablet by mouth two times a day 2)  Nitrostat 0.4 Mg Subl (Nitroglycerin) .Marland Kitchen.. 1 under tongue every 5 minutes x3 as needed chest pain 3)  Augmentin 875-125 Mg Tabs (Amoxicillin-pot clavulanate) .Marland Kitchen.. 1 by mouth two times a day  Patient Instructions: 1)  Augmentin 875mg  two times a day for 7 days 2)   Mucinex DM two times a day as needed  cough /congestion  3)  Fluids and rest  4)  Please contact office for sooner follow up if symptoms do not improve or worsen  5)  follow up up Dr. Kriste Basque 3-4 months and as needed  Prescriptions: AUGMENTIN 875-125 MG TABS (AMOXICILLIN-POT CLAVULANATE) 1 by mouth two times a day  #14 x 0   Entered and Authorized by:   Rubye Oaks NP   Signed by:   Adreanna Fickel NP on 09/30/2010   Method used:   Electronically to        [facility]. [id number]* (retail)       [address]       [place], [place]  [address]       Ph: [phone] or [phone]  Fax: [phone]   RxID:   [id number]

## 2010-12-14 ENCOUNTER — Telehealth: Payer: Self-pay | Admitting: Pulmonary Disease

## 2010-12-14 NOTE — Telephone Encounter (Signed)
Spoke w/ Jeanice Lim and states pt was missing a medication but have figured it out and needed nothing from our office.

## 2010-12-16 NOTE — H&P (Signed)
[facility]  Patient:    Billy Morgan, Billy Morgan                        MRN: [id number] Adm. Date:  03/22/01 Attending:  Philips J. Montez Morita, M.D. Dictator:   Colleen P. Mahar, P.A.                         History and Physical  DATE OF BIRTH:  Jul 03, 1918  CHIEF COMPLAINT:  Back pain and right leg pain.  HISTORY OF PRESENT ILLNESS:  The patient has had increasing back pain that has been radiating down the right leg into the buttock and ankle.  It is currently interfering with activities of daily living.  He has failed conservative management.  Risks and benefits of surgery were discussed with the patient by Philips J. Montez Morita, M.D., and they felt the best course of action at this point would be surgical correction.  ALLERGIES:  SULFA causes a rash.  MEDICATIONS: 1. Aspirin 325 mg q.d.  He will stop as of tomorrow, Friday, March 15, 2001. 2. Metoprolol 50 mg 1 q.d. 3. Felodipine 10 mg 1 q.d. 4. Simvastatin 20 mg 1 p.o. q.d. 5. Alprazolam 1-2 p.r.n.  PAST MEDICAL HISTORY: 1. Hypertension. 2. Hypercholesterolemia. 3. Prostate cancer. 4. Coronary artery disease.  PAST SURGICAL HISTORY: 1. Coronary artery bypass grafting x 3 vessels in 1997. 2. Prostate cancer seeding in 2000. 3. Hernia repair over 20 years ago. 4. Right knee scope.  SOCIAL HISTORY:  The patient denies tobacco use and alcohol use.  He is married and lives with his wife who does require some assistance, as she has had numerous CVAs; however, he does not have to physically assist her, just assists her with activities around the home.  FAMILY HISTORY:  Mother deceased at age 67 with history of emphysema.  Father deceased at age 68 with some type of kidney disease, unknown exact cause.  REVIEW OF SYSTEMS:  GENERAL:  The patient denies fevers or chills, night sweats, or bleeding tendencies.  CNS:  Denies blurred vision, double vision, seizures, headache, or paralysis.  PULMONARY:   Denies shortness of breath, productive cough, or hemoptysis.  CARDIOVASCULAR:  Denies chest pain, palpitations, angina, orthopnea, or claudication.  GI:  Denies nausea or vomiting, constipation, diarrhea, melena, or bloody stools.  GU:  Denies dysuria, hematuria, or discharge.  MUSCULOSKELETAL:  As per HPI.  PHYSICAL EXAMINATION:  VITAL SIGNS:  Blood pressure 120/60, respirations 16 unlabored, pulse 72 and regular.  GENERAL:  The patient is an 77 year old white male who is alert and oriented and in no acute distress.  He is pleasant and cooperative to examination.  He appears his stated age of 77.  HEENT:  Head is normocephalic, atraumatic.  Pupils equal, round, and reactive to light.  Extraocular movement is intact.  Pharynx is clear.  Nares patent bilaterally.  NECK:  Soft and supple to palpation.  No bruits appreciated.  No thyromegaly noted.  LUNGS:  Clear to auscultation, no rales, rhonchi, stridor, wheezes, or friction rub.  BREASTS:  Not pertinent and not performed.  HEART:  Regular rate and rhythm, normal S1, S2, no murmur, rub or gallop appreciated.  ABDOMEN:  Positive bowel sounds throughout, soft, supple, nontender, and nondistended.  No organomegaly noted.  GU:  Not pertinent and not performed.  EXTREMITIES:  There is positive sensation to light touch bilateral lower extremities.  Positive dorsalis pedis in posterior tibialis  pulses bilaterally.  SKIN:  Intact without rashes or lesions.  X-RAY:  MRI revealed multilevel spinal stenosis.  IMPRESSION: 1. L4/L5 spinal stenosis. 2. Hypertension. 3. Hypercholesterolemia. 4. Prostate cancer. 5. Coronary artery disease with coronary artery bypass grafting.  PLAN:  Admit to [facility] on March 22, 2001, to undergo a right-sided hemilaminectomy at L4/L5.  This will be done by [employer]. Montez Morita, M.D.  The patients primary care physician is Lonzo Cloud. Kriste Basque, M.D. Cardiologist is Dr. Tresa Endo and neurologist  Dr. Earlene Plater.  Dr. Montez Morita has apparently spoken with Dr. Nicki Guadalajara, who is the patients cardiologist, and obtained medical clearance and cardiac clearance for this patient to undergo surgery. DD:  03/14/01 TD:  03/14/01 Job: [id number]

## 2010-12-16 NOTE — Assessment & Plan Note (Signed)
[facility]                           GASTROENTEROLOGY OFFICE NOTE   Billy Morgan, Billy Morgan                      MRN:          [id number]  DATE:03/20/2006                            DOB:          1918-06-03    REASON FOR CONSULTATION:  Change in bowel habits.   Mr. Billy Morgan is a pleasant 77 year old white male referred through the  courtesy of Dr. Kriste Morgan for evaluation.  He has noted constipation within the  last six weeks.  With constipation, he was complaining of lower abdominal  pain.  Since started MiraLax, the constipation has resolved, as has his  abdominal pain.  Over the last couple of weeks, he has been off MiraLax but  continues to move his bowel regularly, and he is without pain.  There is no  history of melena or hematochezia.   FAMILY HISTORY:  Pertinent for a son with Crohn's disease and one son with  colon polyps.   PAST MEDICAL HISTORY:  Pertinent for coronary artery disease.  He is status  post CABG.  He has hypertension and a history of prostate cancer.  He is  status post appendectomy and herniorrhaphy.   FAMILY HISTORY:  As stated above.   Medications include aspirin, felodipine, metoprolol, and glucosamine.   He is allergic to SULFA, and TRIMPEX.   He neither smokes or drinks.  He is married and retired.   REVIEW OF SYSTEMS:  Positive for headaches, mild dyspnea on exertion, and  loss of hearing.   PHYSICAL EXAMINATION:  VITAL SIGNS:  Pulse 68, blood pressure 130/60.  Weight 156.  HEENT: EOMI. PERRLA. Sclerae are anicteric.  Conjunctivae are pink.  NECK:  Supple without thyromegaly, adenopathy or carotid bruits.  CHEST:  Clear to auscultation and percussion without adventitious sounds.  CARDIAC:  Regular rhythm; normal S1 S2.  There are no murmurs, gallops or  rubs.  ABDOMEN:  Bowel sounds are normoactive.  Abdomen is soft, non-tender and non-  distended.  There are no abdominal masses, tenderness, splenic enlargement  or hepatomegaly.  EXTREMITIES:  Full range of motion.  No cyanosis, clubbing or edema.  RECTAL:  Deferred.   IMPRESSION:  1. Recent change of bowel habits, which have resolved.  2. Family history of Crohn's disease and colon polyps.  3. Coronary artery disease, stable.  4. History of prostate cancer.   RECOMMENDATION:  Colonoscopy.                                   Billy Morgan. Billy Dice, MD, [facility]   RDK/MedQ  DD:  03/20/2006  DT:  03/20/2006  Job #:  [id number]   cc:   Billy Morgan. Billy Basque, MD

## 2010-12-16 NOTE — Op Note (Signed)
[facility]  Patient:    Billy Morgan, Billy Morgan Visit Number: [id number] MRN: [id number]          Service Type: SUR Location: 4W [id number] 01 Attending Physician:  Rocky Crafts Proc. Date: 03/22/01 Adm. Date:  03/22/2001                             Operative Report  SURGEON:  Philips J. Montez Morita, M.D.  ASSISTANT:  Georges Lynch. Darrelyn Hillock, M.D.  PREOPERATIVE DIAGNOSES:  Lateral recessed stenosis L4-5 on the right secondary to hypertrophied facet joints.  POSTOPERATIVE DIAGNOSES:  Lateral recessed stenosis L4-5 on the right secondary to hypertrophied facet joints.  OPERATION PERFORMED:  Decompressive hemilaminectomy and foraminotomy, facetectomy L4-5 on the right.  DESCRIPTION OF PROCEDURE:  After suitable general anesthesia, he was positioned on the laminectomy frame and prepped and draped routinely. A needle is inserted between the spinous processes of 4 and 5 and an x-ray taken that confirms his position. The 4-5 spinous processes are then exposed subperiosteally after routine prep and drape. A microscope is brought in as well as a bur and the lamina of 4 and the thickened inferior facet of 4 are burred considerably down to where they can use a 3 mm. We then opened the area and followed the nerve root out markedly compressed underneath this huge facet until it is clear. Ligamentum flavum is also removed and a portion of the L5 gave good exposure. Hemostasis with bone wax. Everything looked quite dry. A little Gelfoam over the area at the end. Routine wound closure. Subcuticular 4-0 monocryl for the skin with Steri-Strips, nice compression dressing and goes to recover in good condition. Attending Physician:  Rocky Crafts DD:  03/22/01 TD:  03/22/01 Job: [id number]

## 2010-12-22 ENCOUNTER — Encounter: Payer: Self-pay | Admitting: Pulmonary Disease

## 2011-01-02 ENCOUNTER — Ambulatory Visit: Payer: Medicare PPO | Admitting: Pulmonary Disease

## 2011-01-20 ENCOUNTER — Encounter: Payer: Self-pay | Admitting: Pulmonary Disease

## 2011-01-23 ENCOUNTER — Ambulatory Visit (INDEPENDENT_AMBULATORY_CARE_PROVIDER_SITE_OTHER): Payer: Medicare PPO | Admitting: Pulmonary Disease

## 2011-01-23 ENCOUNTER — Encounter: Payer: Self-pay | Admitting: Pulmonary Disease

## 2011-01-23 DIAGNOSIS — I739 Peripheral vascular disease, unspecified: Secondary | ICD-10-CM

## 2011-01-23 DIAGNOSIS — I10 Essential (primary) hypertension: Secondary | ICD-10-CM

## 2011-01-23 DIAGNOSIS — N259 Disorder resulting from impaired renal tubular function, unspecified: Secondary | ICD-10-CM

## 2011-01-23 DIAGNOSIS — I872 Venous insufficiency (chronic) (peripheral): Secondary | ICD-10-CM

## 2011-01-23 DIAGNOSIS — K219 Gastro-esophageal reflux disease without esophagitis: Secondary | ICD-10-CM

## 2011-01-23 DIAGNOSIS — C61 Malignant neoplasm of prostate: Secondary | ICD-10-CM

## 2011-01-23 DIAGNOSIS — K589 Irritable bowel syndrome without diarrhea: Secondary | ICD-10-CM

## 2011-01-23 DIAGNOSIS — M1990 Unspecified osteoarthritis, unspecified site: Secondary | ICD-10-CM

## 2011-01-23 DIAGNOSIS — E785 Hyperlipidemia, unspecified: Secondary | ICD-10-CM

## 2011-01-23 DIAGNOSIS — I2510 Atherosclerotic heart disease of native coronary artery without angina pectoris: Secondary | ICD-10-CM

## 2011-01-23 DIAGNOSIS — R413 Other amnesia: Secondary | ICD-10-CM

## 2011-01-23 DIAGNOSIS — F411 Generalized anxiety disorder: Secondary | ICD-10-CM

## 2011-01-23 NOTE — Patient Instructions (Signed)
Today we updated your med list in EPIC>    Continue your current meds the same...  Call for any questions...  Stay as active as possible, and BE CAREFUL!  Let's plan a routine follow up visit in 6 months, sooner if needed for problems.Marland KitchenMarland Kitchen

## 2011-01-23 NOTE — Progress Notes (Signed)
Subjective:    Patient ID: Billy Morgan, male    DOB: 10/05/1917, 77 y.o.   MRN: [id number]  HPI 77 y/o WM here for a follow up visit... he has multiple medical problems as noted below...   ~  March 31, 2009:  he denies new problems or concerns, see problem list below... he feels that he is doing well, wants refill perscriptions & due for f/u labs...  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 pt has no new complaints or concerns... +HOH;  BP controlled on meds;  denies CP, palpit, SOB, edema, etc;  Chol looks good on Simva40;  GI & GU are stable;  DJD is mod & well managed by the diclofenac...   ~  January 23, 2011:  71mo ROV & he continues to do remarkably well for 89, no new complaints or concerns; he remains active, in his own home w/ caregivers helping daily, family nearby 7 on his own at night w/o problems identified...     Followed by Howell Rucks for [facility], seen 2/12 & his note reviewed; Myoview was neg- no scar or ischemia & EF=74%; no changes made...   Problem List:   HEARING LOSS (ICD-389.9) - he has hearing aides...  HYPERTENSION (ICD-401.9) - on METOPROLOL 50mg - taking 1/2Bid,  FELODIPINE 10mg /d,  TORSEMIDE 20mg /d... BP today = 128/62 & similar at home, takes meds regularly & tolerates well... denies HA, visual changes, CP, palipit, dizziness, syncope, dyspnea, edema, etc...   ATHEROSCLEROTIC HEART DISEASE (ICD-414.00) - on ASA 81mg /d... followed by [facility] for Cards... he had CABG x5 in 1997 by DrBartle... denies CP/ angina, palpit, SOB, etc... ~  NuclearStressTest 7/07 was neg- no ischemia or infarct... non-gated due to ectopy. ~  2DEcho 11/07 showed norm LV wall thickness & LVF, mild RA & LA dil, mild MR... ~  Myoview 2/12 was neg> no scar or ischemia, & EF=74%...  PERIPHERAL VASCULAR DISEASE  (ICD-443.9) - on ASA 81mg /d and followed by [facility] as noted... ~  Abd Sonar 5/01 showed tortuous Ao w/ mod atherosclerotic changes, no aneurysm.  VENOUS INSUFFICIENCY (ICD-459.81)  HYPERLIPIDEMIA (ICD-272.4) - on SIMVASTATIN 40mg /d now... ~  FLP 4/07 on Simva20 showed TChol 123, TG 62, HDL 35, LDL 76... rec- continue same. ~  FLP 10/09 on Simva20 showed TChol 156, TG 84, HDL 36, LDL 103 ~  FLP 9//10 on Simva20 showed TChol 227, TG 79, HDL 36, LDL 176... ?taking regularly? incr Simva40. ~  FLP 1/12 on Simva40 showed TChol 164, TG 108, HDL 38, LDL 104  GERD (ICD-530.81) - uses OTC meds Prn... denies nausea, vomiting, heartburn, diarrhea, constipation, blood in stool, abdominal pain, swelling, gas...   IRRITABLE BOWEL SYNDROME (ICD-564.1) - on MIRALAX Prn... ~  last colonoscopy 8/07 by Dr Arlyce Dice showed divertics only...  PROSTATE CANCER (ICD-185) & RENAL INSUFFICIENCY (ICD-588.9) - he had radium seed implants 7/00 & is followed by Joneen Boers- we don't have any recent notes... ~  labs here 8/06 showed PSA = 0.11 ~  labs 10/09 showed PSA= 0.73... BUN= 18, Creat= 1.2 ~  labs 9/10 showed PSA= 1.10... slowly increasing PSA may mean recurrent Ca- rec> f/u w/ Urology. ~  labs 1/12 showed PSA= 1.23... BUN= 22, Creat= 1.6  DEGENERATIVE JOINT DISEASE (ICD-715.90) - on DICLOFENAC 50mg Bid.. he sees  [facility]/ Lestine Box at [facility]...  MEMORY LOSS (ICD-780.93) - he notes prob w/ memory... "I don't drive at night" (he got lost and son had to get him)... MMSE= 28/30 last yr... ~  CT Brain 4/08 showed atrophy & sm vessel dis, no acute changes...  ANXIETY (ICD-300.00) - on ALPRAZOLAM 0.25mg  Prn...   Past Surgical History  Procedure Date  . Inguinal hernia repair   . Appendectomy   . Coronary artery bypass graft     Outpatient Encounter Prescriptions as of 01/23/2011  Medication Sig Dispense Refill  . ALPRAZolam (XANAX) 0.25 MG tablet Take 1/2 to 1 tablet three times a day as needed       .  aspirin 81 MG tablet Take 81 mg by mouth daily.        . diclofenac (VOLTAREN) 50 MG EC tablet Take 50 mg by mouth 2 (two) times daily.        . felodipine (PLENDIL) 10 MG 24 hr tablet Take 10 mg by mouth daily.        . metoprolol (TOPROL-XL) 50 MG 24 hr tablet 1/2 by mouth two times a day       . nitroGLYCERIN (NITROSTAT) 0.4 MG SL tablet Place 0.4 mg under the tongue every 5 (five) minutes as needed.        . polyethylene glycol (MIRALAX / GLYCOLAX) packet Take 17 g by mouth daily.        . simvastatin (ZOCOR) 40 MG tablet Take 40 mg by mouth at bedtime.        . torsemide (DEMADEX) 20 MG tablet Take 20 mg by mouth 2 (two) times daily.          Allergies  Allergen Reactions  . Sulfamethoxazole     REACTION: unspecified    Review of Systems       See HPI - all other systems neg except as noted...      The patient complains of malaise, decreased hearing, dyspnea on exertion, constipation, nocturia, joint pain, stiffness, arthritis, and anxiety.  The patient denies fever, chills, sweats, anorexia, fatigue, weakness, weight loss, sleep disorder, blurring, diplopia, eye irritation, eye discharge, vision loss, eye pain, photophobia, earache, ear discharge, tinnitus, nasal congestion, nosebleeds, sore throat, hoarseness, chest pain, palpitations, syncope, orthopnea, PND, peripheral edema, cough, dyspnea at rest, excessive sputum, hemoptysis, wheezing, pleurisy, nausea, vomiting, diarrhea, abdominal pain, melena, hematochezia, jaundice, gas/bloating, indigestion/heartburn, dysphagia, odynophagia, dysuria, hematuria, urinary frequency, urinary hesitancy, incontinence, back pain, joint swelling, muscle cramps, muscle weakness, sciatica, restless legs, leg pain at night, leg pain with exertion, rash, itching, dryness, suspicious lesions, paralysis, paresthesias, seizures, tremors, vertigo, transient blindness, frequent falls, frequent headaches, difficulty walking, depression, memory loss, confusion, cold  intolerance, heat intolerance, polydipsia, polyphagia, polyuria, unusual weight change, abnormal bruising, bleeding, enlarged lymph nodes, urticaria, allergic rash, hay fever, and recurrent infections.    Objective:   Physical Exam     WD, WN, 77 y/o WM in NAD... GENERAL:  Alert & oriented; pleasant & cooperative... HEENT:  [place]/AT, EOM-wnl, PERRLA, EACs-clear, TMs-wnl, NOSE-clear, THROAT-clear & wnl. NECK:  Supple w/ fairROM; no JVD; normal carotid impulses w/o bruits; no thyromegaly or nodules palpated; no lymphadenopathy. CHEST:  Clear to P & A; without wheezes/ rales/ or rhonchi. HEART:  Regular Rhythm; without murmurs/ rubs/ or gallops. ABDOMEN:  Soft w/ normal bowel sounds; non-tender, no organomegaly or masses detected. EXT: without deformities, mild arthritic changes; no varicose veins/ +venous insuffic/ tr edema. NEURO:  CN's intact;  no focal neuro deficits.Marland KitchenMarland Kitchen  DERM:  No lesions noted; no rash etc...   Assessment & Plan:   HBP>  Controlled on Metop, Felodip, Torsemide> continue same...  ASHD>  Followed by Howell Rucks & stable on ASA + above rx; neg Myoview 2/12, rec to continue to stay active etc...  ASPVD>  Aware, no signif claud symptoms & asked to remain as active as poss...  CHOL>  On Simva40 & stable, continue same...  GI>  GERD/  IBS>  Doing satis w/ Miralax regularly...  Prostate Cancer & mild Renal Insuffic>  Very slowly rising PSA s/p radium seeds 2000 & Creat= 1.6  DJD>  Aware, he uses Voltaren prn.Marland KitchenMarland Kitchen

## 2011-01-29 ENCOUNTER — Encounter: Payer: Self-pay | Admitting: Pulmonary Disease

## 2011-02-22 ENCOUNTER — Telehealth: Payer: Self-pay | Admitting: Pulmonary Disease

## 2011-02-22 DIAGNOSIS — I10 Essential (primary) hypertension: Secondary | ICD-10-CM

## 2011-02-22 NOTE — Telephone Encounter (Signed)
Per SN---ok for pt to have labs done here and we can fax them to the cardiology office in [place].  Called and spoke with pt and he stated that he will have her call me back in the morning so he will know when he can come for this.

## 2011-02-22 NOTE — Telephone Encounter (Signed)
Called and spoke with Clydie Braun from [facility].  She states pt lives in [place] but sees cardiology there.  Clydie Braun states the cardiologist recently started pt on Lasix and they would like to recheck pt's BMP.  Pt is requesting this be done at our facility here since pt lives in [place] and then the results be faxed to Clydie Braun at [phone].  SN, please advise if ok to order BMP.  Thanks.

## 2011-02-23 ENCOUNTER — Telehealth: Payer: Self-pay | Admitting: Pulmonary Disease

## 2011-02-23 NOTE — Telephone Encounter (Signed)
See other phone note

## 2011-02-23 NOTE — Telephone Encounter (Signed)
Called and spoke with holly--pts caregiver and she will bring him by tomorrow after lunch for his labs and once these are done we will fax them to cardiology.

## 2011-02-23 NOTE — Telephone Encounter (Signed)
Spoke with pt's caregiver. She states that she was under the impression that pt needed to have labs asap- she states that SN has been speaking with the pt's son in law who is a physician and he knows what labs to order. She states that when we call back call hr cell number which is [phone]. SN, pls advise thanks!

## 2011-02-24 ENCOUNTER — Other Ambulatory Visit (INDEPENDENT_AMBULATORY_CARE_PROVIDER_SITE_OTHER): Payer: Medicare PPO

## 2011-02-24 DIAGNOSIS — I10 Essential (primary) hypertension: Secondary | ICD-10-CM

## 2011-02-24 LAB — BASIC METABOLIC PANEL
BUN: 29 mg/dL — ABNORMAL HIGH (ref 6–23)
Creatinine, Ser: 1.5 mg/dL (ref 0.4–1.5)
GFR: 48.12 mL/min — ABNORMAL LOW (ref >60.00)
Glucose, Bld: 141 mg/dL — ABNORMAL HIGH (ref 70–99)

## 2011-04-23 ENCOUNTER — Encounter (HOSPITAL_BASED_OUTPATIENT_CLINIC_OR_DEPARTMENT_OTHER): Payer: Self-pay | Admitting: *Deleted

## 2011-04-23 ENCOUNTER — Emergency Department (HOSPITAL_BASED_OUTPATIENT_CLINIC_OR_DEPARTMENT_OTHER)
Admission: EM | Admit: 2011-04-23 | Discharge: 2011-04-23 | Disposition: A | Discharge status: Home / Self Care | Payer: Medicare PPO | Attending: Emergency Medicine | Admitting: Emergency Medicine

## 2011-04-23 ENCOUNTER — Emergency Department (INDEPENDENT_AMBULATORY_CARE_PROVIDER_SITE_OTHER): Payer: Medicare PPO

## 2011-04-23 DIAGNOSIS — M5137 Other intervertebral disc degeneration, lumbosacral region: Secondary | ICD-10-CM | POA: Insufficient documentation

## 2011-04-23 DIAGNOSIS — M549 Dorsalgia, unspecified: Secondary | ICD-10-CM | POA: Insufficient documentation

## 2011-04-23 DIAGNOSIS — Z79899 Other long term (current) drug therapy: Secondary | ICD-10-CM | POA: Insufficient documentation

## 2011-04-23 DIAGNOSIS — E785 Hyperlipidemia, unspecified: Secondary | ICD-10-CM | POA: Insufficient documentation

## 2011-04-23 DIAGNOSIS — I700 Atherosclerosis of aorta: Secondary | ICD-10-CM

## 2011-04-23 DIAGNOSIS — R609 Edema, unspecified: Secondary | ICD-10-CM | POA: Insufficient documentation

## 2011-04-23 DIAGNOSIS — J90 Pleural effusion, not elsewhere classified: Secondary | ICD-10-CM

## 2011-04-23 DIAGNOSIS — K5730 Diverticulosis of large intestine without perforation or abscess without bleeding: Secondary | ICD-10-CM | POA: Insufficient documentation

## 2011-04-23 DIAGNOSIS — I10 Essential (primary) hypertension: Secondary | ICD-10-CM | POA: Insufficient documentation

## 2011-04-23 DIAGNOSIS — R109 Unspecified abdominal pain: Secondary | ICD-10-CM

## 2011-04-23 DIAGNOSIS — K589 Irritable bowel syndrome without diarrhea: Secondary | ICD-10-CM | POA: Insufficient documentation

## 2011-04-23 DIAGNOSIS — M51379 Other intervertebral disc degeneration, lumbosacral region without mention of lumbar back pain or lower extremity pain: Secondary | ICD-10-CM | POA: Insufficient documentation

## 2011-04-23 DIAGNOSIS — K219 Gastro-esophageal reflux disease without esophagitis: Secondary | ICD-10-CM | POA: Insufficient documentation

## 2011-04-23 DIAGNOSIS — I2510 Atherosclerotic heart disease of native coronary artery without angina pectoris: Secondary | ICD-10-CM

## 2011-04-23 LAB — URINALYSIS, ROUTINE W REFLEX MICROSCOPIC
Bilirubin Urine: NEGATIVE
Ketones, ur: NEGATIVE mg/dL
Leukocytes, UA: NEGATIVE
Nitrite: NEGATIVE
Protein, ur: NEGATIVE mg/dL

## 2011-04-23 LAB — BASIC METABOLIC PANEL
BUN: 34 mg/dL — ABNORMAL HIGH (ref 6–23)
Calcium: 9.8 mg/dL (ref 8.4–10.5)
GFR calc Af Amer: 53 mL/min — ABNORMAL LOW (ref >60)
GFR calc non Af Amer: 44 mL/min — ABNORMAL LOW (ref >60)
Glucose, Bld: 105 mg/dL — ABNORMAL HIGH (ref 70–99)
Potassium: 3.4 mEq/L — ABNORMAL LOW (ref 3.5–5.1)

## 2011-04-23 LAB — CBC
MCH: 32.3 pg (ref 26.0–34.0)
MCHC: 34.7 g/dL (ref 30.0–36.0)
Platelets: 179 10*3/uL (ref 150–400)
RDW: 12.6 % (ref 11.5–15.5)

## 2011-04-23 MED ORDER — HYDROCODONE-ACETAMINOPHEN 5-325 MG PO TABS
1.0000 | ORAL_TABLET | Freq: Once | ORAL | Status: AC
  Administered 2011-04-23: 1 via ORAL
  Filled 2011-04-23: qty 1

## 2011-04-23 MED ORDER — HYDROCODONE-ACETAMINOPHEN 5-325 MG PO TABS: 2.0000 | ORAL_TABLET | ORAL | Status: AC | PRN

## 2011-04-23 NOTE — ED Notes (Signed)
Family states that pt has been c/o left flank pain for a few days. Urine "smells".

## 2011-04-23 NOTE — ED Provider Notes (Addendum)
History  Scribed for Dr. [name], the patient was seen in room 01. The chart was scribed by [name]. The patients care was started at 1641.  CSN: [id number] Arrival date & time: 04/23/2011  4:32 PM  Chief Complaint  Patient presents with  . Flank Pain    HPI   Patient is a 77 y.o. male presenting with flank pain. The history is provided by the patient and a relative.  Flank Pain Pertinent negatives include no chest pain, no abdominal pain and no shortness of breath.    Billy Morgan is a 77 y.o. male who presents to the Emergency Department complaining of left sided flank pain. Pt reports sudden flank pain onset few days ago and worsening. Pt is constant and sharp but is not exacerbated by movement. Pain is not radiating to abdomen or legs. Pt denies any difficulty urinating, hematuria, kidney stones, fever, N/V, CP, SOB, abdominal pain, weakness, rash, cold symptoms, or congestion. Pt has never had similar symptoms. There are no other associated symptoms and no other alleviating or aggravating factors.   HPI ELEMENTS:  Location: left sided flank  Onset: few days ago Duration: persistent since onset  Timing: constant  Quality: sharp  Modifying factors: denies any exacerbation by movement Context:  as above  Associated symptoms:  denies any difficulty urinating, hematuria, kidney stones, fever, N/V, CP, SOB, abdominal pain, weakness, rash, cold symptoms, or congestion.  PAST MEDICAL HISTORY:  Past Medical History  Diagnosis Date  . Unspecified hearing loss   . Unspecified essential hypertension   . Coronary atherosclerosis of unspecified type of vessel, native or graft   . Peripheral vascular disease, unspecified   . Unspecified venous (peripheral) insufficiency   . Other and unspecified hyperlipidemia   . Esophageal reflux   . Irritable bowel syndrome   . Malignant neoplasm of prostate   . Unspecified disorder resulting from impaired renal function   . Osteoarthrosis,  unspecified whether generalized or localized, unspecified site   . Memory loss   . Anxiety state, unspecified      PAST SURGICAL HISTORY:  Past Surgical History  Procedure Date  . Inguinal hernia repair   . Appendectomy   . Coronary artery bypass graft      MEDICATIONS:  Previous Medications   ALPRAZOLAM (XANAX) 0.25 MG TABLET    Take 0.25 mg by mouth at bedtime.    ASPIRIN 81 MG TABLET    Take 81 mg by mouth daily.     DICLOFENAC (VOLTAREN) 50 MG EC TABLET    Take 50 mg by mouth 2 (two) times daily.     FELODIPINE (PLENDIL) 10 MG 24 HR TABLET    Take 10 mg by mouth daily.     METOPROLOL (TOPROL-XL) 50 MG 24 HR TABLET    1/2 by mouth two times a day    NITROGLYCERIN (NITROSTAT) 0.4 MG SL TABLET    Place 0.4 mg under the tongue every 5 (five) minutes as needed.     POLYETHYLENE GLYCOL (MIRALAX / GLYCOLAX) PACKET    Take 17 g by mouth daily as needed. For constipation   SIMVASTATIN (ZOCOR) 40 MG TABLET    Take 40 mg by mouth at bedtime.     TORSEMIDE (DEMADEX) 20 MG TABLET    Take 20 mg by mouth daily.      ALLERGIES:  Allergies as of 04/23/2011 - Review Complete 04/23/2011  Allergen Reaction Noted  . Sulfamethoxazole  10/17/2005     FAMILY HISTORY:  No  family history on file.   SOCIAL HISTORY: History  Substance Use Topics  . Smoking status: Never Smoker   . Smokeless tobacco: Never Used  . Alcohol Use: No      Review of Systems  Review of Systems  Constitutional: Negative for fever.  HENT: Negative for congestion.   Respiratory: Negative for shortness of breath.   Cardiovascular: Negative for chest pain.  Gastrointestinal: Negative for nausea, vomiting and abdominal pain.  Genitourinary: Positive for flank pain. Negative for hematuria and difficulty urinating.  Musculoskeletal:       Left sided flank pain  Skin: Negative for rash.  Neurological: Negative for weakness.  All other systems reviewed and are negative.    Allergies  Sulfamethoxazole  Home  Medications   Current Outpatient Rx  Name Route Sig Dispense Refill  . ALPRAZOLAM 0.25 MG PO TABS Oral Take 0.25 mg by mouth at bedtime.     . ASPIRIN 81 MG PO TABS Oral Take 81 mg by mouth daily.      Marland Kitchen FELODIPINE 10 MG PO TB24 Oral Take 10 mg by mouth daily.      Marland Kitchen METOPROLOL SUCCINATE 50 MG PO TB24  1/2 by mouth two times a day     . POLYETHYLENE GLYCOL 3350 PO PACK Oral Take 17 g by mouth daily as needed. For constipation    . SIMVASTATIN 40 MG PO TABS Oral Take 40 mg by mouth at bedtime.      . TORSEMIDE 20 MG PO TABS Oral Take 20 mg by mouth daily.     Marland Kitchen DICLOFENAC SODIUM 50 MG PO TBEC Oral Take 50 mg by mouth 2 (two) times daily.      Marland Kitchen HYDROCODONE-ACETAMINOPHEN 5-325 MG PO TABS Oral Take 2 tablets by mouth every 4 (four) hours as needed for pain. 10 tablet 0  . NITROGLYCERIN 0.4 MG SL SUBL Sublingual Place 0.4 mg under the tongue every 5 (five) minutes as needed.        Physical Exam    BP 146/75  Pulse 84  Temp(Src) 97.9 F (36.6 C) (Oral)  Resp 20  Ht 6' (1.829 m)  Wt 176 lb (79.833 kg)  BMI 23.87 kg/m2  SpO2 98%  Physical Exam  Constitutional: He is oriented to person, place, and time. He appears well-developed and well-nourished.  HENT:  Head: Normocephalic and atraumatic.  Right Ear: External ear normal.  Left Ear: External ear normal.  Eyes: Conjunctivae and EOM are normal. Pupils are equal, round, and reactive to light.  Neck: Normal range of motion and phonation normal.  Cardiovascular: Normal rate, regular rhythm, normal heart sounds and intact distal pulses.  Exam reveals no gallop and no friction rub.   No murmur heard. Pulmonary/Chest: Effort normal and breath sounds normal. He exhibits no bony tenderness.  Abdominal: Soft. Normal appearance and bowel sounds are normal. He exhibits no distension. There is no tenderness.  Musculoskeletal: Normal range of motion. He exhibits edema (slight pitting edema LE).       Moves extremities symmetrically     Neurological: He is alert and oriented to person, place, and time. He has normal strength. No cranial nerve deficit or sensory deficit. Coordination normal.       1+ pedal pulses bilaterally   Skin: Skin is warm, dry and intact.  Psychiatric: He has a normal mood and affect. His behavior is normal. Judgment and thought content normal.    ED Course  Procedures  OTHER DATA REVIEWED: Nursing notes, vital signs,  and past medical records reviewed.   DIAGNOSTIC STUDIES: Oxygen Saturation is 98% on room air, normal by my interpretation.     LABS:  Results for orders placed during the hospital encounter of 04/23/11  URINALYSIS, ROUTINE W REFLEX MICROSCOPIC      Component Value Range   Color, Urine YELLOW  YELLOW    Appearance CLEAR  CLEAR    Specific Gravity, Urine 1.018  1.005 - 1.030    pH 5.0  5.0 - 8.0    Glucose, UA NEGATIVE  NEGATIVE (mg/dL)   Hgb urine dipstick NEGATIVE  NEGATIVE    Bilirubin Urine NEGATIVE  NEGATIVE    Ketones, ur NEGATIVE  NEGATIVE (mg/dL)   Protein, ur NEGATIVE  NEGATIVE (mg/dL)   Urobilinogen, UA 0.2  0.0 - 1.0 (mg/dL)   Nitrite NEGATIVE  NEGATIVE    Leukocytes, UA NEGATIVE  NEGATIVE   BASIC METABOLIC PANEL      Component Value Range   Sodium 138  135 - 145 (mEq/L)   Potassium 3.4 (*) 3.5 - 5.1 (mEq/L)   Chloride 97  96 - 112 (mEq/L)   CO2 27  19 - 32 (mEq/L)   Glucose, Bld 105 (*) 70 - 99 (mg/dL)   BUN 34 (*) 6 - 23 (mg/dL)   Creatinine, Ser 1.61 (*) 0.50 - 1.35 (mg/dL)   Calcium 9.8  8.4 - 09.6 (mg/dL)   GFR calc non Af Amer 44 (*) >60 (mL/min)   GFR calc Af Amer 53 (*) >60 (mL/min)  CBC      Component Value Range   WBC 10.5  4.0 - 10.5 (K/uL)   RBC 4.74  4.22 - 5.81 (MIL/uL)   Hemoglobin 15.3  13.0 - 17.0 (g/dL)   HCT 04.5  40.9 - 81.1 (%)   MCV 93.0  78.0 - 100.0 (fL)   MCH 32.3  26.0 - 34.0 (pg)   MCHC 34.7  30.0 - 36.0 (g/dL)   RDW 91.4  78.2 - 95.6 (%)   Platelets 179  150 - 400 (K/uL)        ED COURSE / COORDINATION OF  CARE: 1641:  - Patient evaluated by ED physician, BMP, CBC, UA, and CT Ab Pelvis ordered  MDM: kidney stone vs aortic injury vs uti vs musculoskeletal pain  IMPRESSION: Diagnoses that have been ruled out:  Diagnoses that are still under consideration:  Final diagnoses:  Back pain    PLAN:  Home Narcotic pain medication The patient is to return the emergency department if there is any worsening of symptoms. I have reviewed the discharge instructions with the patient, family  CONDITION ON DISCHARGE: Good  MEDICATIONS GIVEN IN THE E.D.  Medications  HYDROcodone-acetaminophen (NORCO) 5-325 MG per tablet (not administered)  HYDROcodone-acetaminophen (NORCO) 5-325 MG per tablet 1 tablet (1 tablet Oral Given 04/23/11 1703)    DISCHARGE MEDICATIONS: New Prescriptions   HYDROCODONE-ACETAMINOPHEN (NORCO) 5-325 MG PER TABLET    Take 2 tablets by mouth every 4 (four) hours as needed for pain.    SCRIBE ATTESTATION:     Labs Reviewed  BASIC METABOLIC PANEL - Abnormal; Notable for the following:    Potassium 3.4 (*)    Glucose, Bld 105 (*)    BUN 34 (*)    Creatinine, Ser 1.50 (*)    GFR calc non Af Amer 44 (*)    GFR calc Af Amer 53 (*)    All other components within normal limits  URINALYSIS, ROUTINE W REFLEX MICROSCOPIC  CBC   Results  for orders placed during the hospital encounter of 04/23/11  URINALYSIS, ROUTINE W REFLEX MICROSCOPIC      Component Value Range   Color, Urine YELLOW  YELLOW    Appearance CLEAR  CLEAR    Specific Gravity, Urine 1.018  1.005 - 1.030    pH 5.0  5.0 - 8.0    Glucose, UA NEGATIVE  NEGATIVE (mg/dL)   Hgb urine dipstick NEGATIVE  NEGATIVE    Bilirubin Urine NEGATIVE  NEGATIVE    Ketones, ur NEGATIVE  NEGATIVE (mg/dL)   Protein, ur NEGATIVE  NEGATIVE (mg/dL)   Urobilinogen, UA 0.2  0.0 - 1.0 (mg/dL)   Nitrite NEGATIVE  NEGATIVE    Leukocytes, UA NEGATIVE  NEGATIVE   BASIC METABOLIC PANEL      Component Value Range   Sodium 138  135 - 145  (mEq/L)   Potassium 3.4 (*) 3.5 - 5.1 (mEq/L)   Chloride 97  96 - 112 (mEq/L)   CO2 27  19 - 32 (mEq/L)   Glucose, Bld 105 (*) 70 - 99 (mg/dL)   BUN 34 (*) 6 - 23 (mg/dL)   Creatinine, Ser 4.09 (*) 0.50 - 1.35 (mg/dL)   Calcium 9.8  8.4 - 81.1 (mg/dL)   GFR calc non Af Amer 44 (*) >60 (mL/min)   GFR calc Af Amer 53 (*) >60 (mL/min)  CBC      Component Value Range   WBC 10.5  4.0 - 10.5 (K/uL)   RBC 4.74  4.22 - 5.81 (MIL/uL)   Hemoglobin 15.3  13.0 - 17.0 (g/dL)   HCT 91.4  78.2 - 95.6 (%)   MCV 93.0  78.0 - 100.0 (fL)   MCH 32.3  26.0 - 34.0 (pg)   MCHC 34.7  30.0 - 36.0 (g/dL)   RDW 21.3  08.6 - 57.8 (%)   Platelets 179  150 - 400 (K/uL)   Ct Abdomen Pelvis Wo Contrast  04/23/2011  *RADIOLOGY REPORT*  Clinical Data: Left flank pain radiating to back for several days. History of prostate cancer.  History of prior appendectomy. History of renal stones.  CT ABDOMEN AND PELVIS WITHOUT CONTRAST  Technique:  Multidetector CT imaging of the abdomen and pelvis was performed following the standard protocol without intravenous contrast.  Comparison: None  Findings: There is a small right pleural effusion, with adjacent pleural calcification. Focal pleural calcification is seen in the left lung base, without effusion.  There is a focal opacity in the right lower lobe that is peripheral and abuts the pleural effusion. This could be chronic rounded atelectasis or focal airspace disease.  There is atherosclerotic calcification of the coronary arteries.  Inferior aspect of median sternotomy noted.  There are no renal calculi.  A 9 mm low density exophytic lesion extending medially from the upper pole the right kidney is not completely characterized.  There is no hydronephrosis of either kidney.  Both ureters are normal in caliber.  No ureteral stone is identified.  Urinary bladder is within normal limits.  There is no bladder stone.  Multiple radiation seeds are noted within the prostate gland.  The  noncontrast appearance of the liver, gallbladder, spleen, are within normal limits.  There is fullness of the adrenal glands bilaterally suggesting adrenal gland hyperplasia.  There is some fatty atrophy of the pancreas.  No pancreatic mass or inflammatory change identified.  There is extensive atherosclerotic disease of the tortuous abdominal aorta.  Abdominal aorta measures a maximum caliber of 2.2 cm AP diameter.  Negative  for aneurysm.  There is atherosclerotic calcification of the iliac vasculature.  There is a moderate amount of stool throughout the colon.  There are are numerous colonic diverticula, especially the sigmoid colon. No evidence of bowel wall thickening or mesenteric inflammatory change.  Small bowel loops are normal in caliber.  Stomach is unremarkable.  There is no ascites, free air, or lymphadenopathy.  There is convex right scoliosis of the upper lumbar spine convex left scoliosis of the lower lumbar spine, with multilevel degenerative disc disease.  No fracture or suspicious bony lesion. Bony pelvis is intact.  IMPRESSION:  1.  Negative for urinary tract stone disease or obstruction. 2.  Moderate amount of stool throughout the colon and colonic diverticulosis.  No evidence of acute diverticulitis. 3.  Scoliosis and multilevel degenerative disc disease of the lumbar spine. 4. Small right pleural effusion is likely chronic and bilateral pleural calcifications; these findings suggest prior asbestos exposure (asbestosis. 5.  Focal opacity at the right lung base may reflect chronic rounded atelectasis secondary to the adjacent pleural disease, or focal airspace disease. 6.  Prostate radiation seeds.  Original Report Authenticated By: Britta Mccreedy, M.D.   Pt with likely MS pain, urine, CT negative.  Will have him f/u with PMD within the next few days    1. Back pain              Rolan Bucco, MD 04/24/11 1610  Rolan Bucco, MD 04/24/11 [id number]

## 2011-05-24 ENCOUNTER — Other Ambulatory Visit: Payer: Self-pay | Admitting: Pulmonary Disease

## 2011-06-26 ENCOUNTER — Other Ambulatory Visit: Payer: Self-pay | Admitting: Pulmonary Disease

## 2011-08-01 DIAGNOSIS — Z9289 Personal history of other medical treatment: Secondary | ICD-10-CM

## 2011-08-01 HISTORY — DX: Personal history of other medical treatment: Z92.89

## 2011-08-02 ENCOUNTER — Other Ambulatory Visit (INDEPENDENT_AMBULATORY_CARE_PROVIDER_SITE_OTHER): Payer: Medicare PPO

## 2011-08-02 ENCOUNTER — Ambulatory Visit (INDEPENDENT_AMBULATORY_CARE_PROVIDER_SITE_OTHER)
Admission: RE | Admit: 2011-08-02 | Discharge: 2011-08-02 | Disposition: A | Discharge status: Home / Self Care | Payer: Medicare PPO | Source: Ambulatory Visit | Attending: Pulmonary Disease | Admitting: Pulmonary Disease

## 2011-08-02 ENCOUNTER — Encounter: Payer: Self-pay | Admitting: Pulmonary Disease

## 2011-08-02 ENCOUNTER — Ambulatory Visit (INDEPENDENT_AMBULATORY_CARE_PROVIDER_SITE_OTHER): Payer: Medicare PPO | Admitting: Pulmonary Disease

## 2011-08-02 VITALS — BP 118/60 | HR 76 | Temp 97.8°F | Ht 72.0 in | Wt 190.4 lb

## 2011-08-02 DIAGNOSIS — K219 Gastro-esophageal reflux disease without esophagitis: Secondary | ICD-10-CM

## 2011-08-02 DIAGNOSIS — N259 Disorder resulting from impaired renal tubular function, unspecified: Secondary | ICD-10-CM

## 2011-08-02 DIAGNOSIS — K589 Irritable bowel syndrome without diarrhea: Secondary | ICD-10-CM

## 2011-08-02 DIAGNOSIS — R0609 Other forms of dyspnea: Secondary | ICD-10-CM

## 2011-08-02 DIAGNOSIS — J069 Acute upper respiratory infection, unspecified: Secondary | ICD-10-CM

## 2011-08-02 DIAGNOSIS — I872 Venous insufficiency (chronic) (peripheral): Secondary | ICD-10-CM

## 2011-08-02 DIAGNOSIS — C61 Malignant neoplasm of prostate: Secondary | ICD-10-CM

## 2011-08-02 DIAGNOSIS — F411 Generalized anxiety disorder: Secondary | ICD-10-CM

## 2011-08-02 DIAGNOSIS — E785 Hyperlipidemia, unspecified: Secondary | ICD-10-CM

## 2011-08-02 DIAGNOSIS — I739 Peripheral vascular disease, unspecified: Secondary | ICD-10-CM

## 2011-08-02 DIAGNOSIS — R0600 Dyspnea, unspecified: Secondary | ICD-10-CM

## 2011-08-02 DIAGNOSIS — M1990 Unspecified osteoarthritis, unspecified site: Secondary | ICD-10-CM

## 2011-08-02 DIAGNOSIS — I10 Essential (primary) hypertension: Secondary | ICD-10-CM

## 2011-08-02 DIAGNOSIS — I2510 Atherosclerotic heart disease of native coronary artery without angina pectoris: Secondary | ICD-10-CM

## 2011-08-02 LAB — CBC WITH DIFFERENTIAL/PLATELET
Basophils Absolute: 0 10*3/uL (ref 0.0–0.1)
HCT: 39.6 % (ref 39.0–52.0)
Hemoglobin: 13.4 g/dL (ref 13.0–17.0)
Lymphs Abs: 2.2 10*3/uL (ref 0.7–4.0)
MCV: 96.6 fl (ref 78.0–100.0)
Monocytes Absolute: 1.2 10*3/uL — ABNORMAL HIGH (ref 0.1–1.0)
Monocytes Relative: 10.5 % (ref 3.0–12.0)
Neutro Abs: 7.5 10*3/uL (ref 1.4–7.7)
RDW: 13 % (ref 11.5–14.6)

## 2011-08-02 LAB — BASIC METABOLIC PANEL
Calcium: 8.7 mg/dL (ref 8.4–10.5)
GFR: 34.22 mL/min — ABNORMAL LOW (ref >60.00)
Potassium: 4.1 mEq/L (ref 3.5–5.1)
Sodium: 140 mEq/L (ref 135–145)

## 2011-08-02 LAB — HEPATIC FUNCTION PANEL
AST: 45 U/L — ABNORMAL HIGH (ref 0–37)
Albumin: 3.7 g/dL (ref 3.5–5.2)
Alkaline Phosphatase: 54 U/L (ref 39–117)
Bilirubin, Direct: 0.2 mg/dL (ref 0.0–0.3)
Total Bilirubin: 0.8 mg/dL (ref 0.3–1.2)

## 2011-08-02 MED ORDER — AZITHROMYCIN 250 MG PO TABS: ORAL_TABLET | ORAL | Status: AC

## 2011-08-02 MED ORDER — METHYLPREDNISOLONE ACETATE 80 MG/ML IJ SUSP
120.0000 mg | Freq: Once | INTRAMUSCULAR | Status: AC
  Administered 2011-08-02: 120 mg via INTRAMUSCULAR

## 2011-08-02 NOTE — Patient Instructions (Signed)
Today we updated your med list in our EPIC system...    Continue your current medications the same...    We wrote for an antibiotic: Zithromax (ZPak) to take now as directed  We gave you a Depo shot today for the upper airway inflamm...    Add in the OTC MUCINEX 1-2 tabs twice daily w/ lots of water...    OK to continue the lozenges, Tylenol, etc...  Today we did your follow up CXR, & non- fasting blood work...    Please call the [facility] in a few days for your results...    Dial [id number] & when prompted enter your patient number followed by the # symbol...    Your patient number is:  [id number]#  Call for any questions...  Let's plan a routine follow up in about 6 months w/ FASTING lipid profile (cholesterol check) at that time.Marland KitchenMarland Kitchen

## 2011-08-02 NOTE — Progress Notes (Signed)
Subjective:    Patient ID: Billy Morgan, male    DOB: 30-Jul-1918, 78 y.o.   MRN: [id number]  HPI 78 y/o WM here for a follow up visit... he has multiple medical problems as noted below...   ~  August 16, 2010:  16 month ROV & review> he is quite remarkable for 78 y/o- still lives in own home & cares for wife w/ help of CNA's that they hire in shifts, his son supervises everyting but confirms that he is doing satis... pt has no new complaints or concerns... +HOH;  BP controlled on meds;  denies CP, palpit, SOB, edema, etc;  Chol looks good on Simva40;  GI & GU are stable;  DJD is mod & well managed by the diclofenac...   ~  January 23, 2011:  73mo ROV & he continues to do remarkably well for 89, no new complaints or concerns; he remains active, in his own home w/ caregivers helping daily, family nearby & on his own at night w/o problems identified;  Wife passed away 12/10/2022 on home hospice after hosp 3/12 w/ stroke etc.    Followed by Howell Rucks for [facility], seen 2/12 & his note reviewed; Myoview was neg- no scar or ischemia & EF=74%; no changes made...  ~  August 02, 2011:  71mo ROV & Claudie is c/o URI "stopped up" he says; notes "chest cold" x 3d w/ chest congestion, "tight", mild cough, yellow phlegm, hoarse; denies f/c/s, SOB, edema; he had Flu vaccine 11/12;  We discussed RX w/ Depo120, ZPak, Mucinex, Fluids, Tylenol, etc...    He reports going to ER 9/12 w/ left flank pain per ER notes reviewed> Exam was neg; Urine was clear; Labs were essentially wnl (K 3.4, Creat 1.5, WBC 10.5); CT Abd done & no acute prob ident (see below for mult incidental findings in the 78 y/o man); pain was felt to be musculoskeletal & given Vicodin> he improved over time & resolved...     HBP> on MetoprololER50-1/2Bid, Felodipine10, Demadex20- DrKelly incr to 2/d & extra if needed; BP= 118/60 & they bring an extensive BP log but ave pressures are ok & he denies CP, palpit, dizzy, syncope, SOB; they report some edema which prompted  drKelly to incr the diuretic but Creat up to 2.0 today & cautioned to minimize the diuretic- avoid sodium, elev legs, wear support hose...    CAD, s/p CABG x5 in 1997> on ASA81+ above meds, followed by [facility] & seen 10/12, stable & no changes made; son tells me he was recently seen w/ incr edema & weight (wt today= 190# up 11#)==> told to increase the Demadex & we will fax copies of labs to [facility] (note BNP today= 229)...    PAD> on ASA81 & followed by [facility]; prev sonar & CTAbd revealed extensive atherosclerotic changes, tortuous Ao & iliacs, no aneurysm...    Hyperlipid> on Crestor20 now; prev on Simva40 & ?changed by [facility]?; last FLP on Simva 1/12 as noted below, not fasting today...    GI> GERD, IBS> on Prilosec OTC prn & Miralax prn; they deny GI issues at present...    GU> hx prostate ca & renal insuffic> treated w/ radium seed implants in 2000; slowly rising PSA noted but we don't have notes from Urology [facility]); ?when he was last seen?  As noted he has mild renal insuffic w/ baseline Creat ~1.5-1.6 but labs today 2.0 after incr in diuretic; asked to decr Demadex back to prev level & caution w/ NSAIDs.Marland KitchenMarland Kitchen  DJD> on Voltaren 50Bid as needed & asked to minimize the NSAID rx, use Tylenol first...    Memory loss/ anxiety> on Xanax0.25 prn; he manages pretty well for 89 w/ son's help...   Problem List:   HEARING LOSS (ICD-389.9) - he has hearing aides...  HYPERTENSION (ICD-401.9) - on METOPROLOL 50mg - taking 1/2Bid,  FELODIPINE 10mg /d,  TORSEMIDE 20mg - increased by [facility] to 2-4 daily ...  ~  6/12: BP= 128/62 & similar at home, takes meds regularly & tolerates well... denies HA, visual changes, CP, palipit, dizziness, syncope, dyspnea, edema, etc...  ~  12/12: BP= 118/60 & DrKelly recently increased Demadex due to swelling & wt gain; Creat up to 2.0 & asked to decr the demadex to prev level (?2daily).  ATHEROSCLEROTIC HEART DISEASE (ICD-414.00) - on ASA 81mg /d; followed by Howell Rucks for [facility]> . he  had CABG x5 in 1997 by DrBartle; denies CP/ angina, palpit, SOB, etc... ~  NuclearStressTest 7/07 was neg- no ischemia or infarct... non-gated due to ectopy. ~  2DEcho 11/07 showed norm LV wall thickness & LVF, mild RA & LA dil, mild MR... ~  Myoview 2/12 was neg> no scar or ischemia, & EF=74%... ~  10/12: DrKelly's note indicates new T-inversions in III & avF not seen before, pt asymptomatic & they are following.  PERIPHERAL VASCULAR DISEASE (ICD-443.9) - on ASA 81mg /d and followed by [facility] as noted... ~  Abd Sonar 5/01 showed tortuous Ao w/ mod atherosclerotic changes, no aneurysm. ~  CT Abd 9/12 via ER for flank pain showed incidental finding of extensive atherosclerotic changes in Ao & iliacs, no aneurysm.  VENOUS INSUFFICIENCY (ICD-459.81)  HYPERLIPIDEMIA (ICD-272.4) - prev on Simva40, now lists CRESTOR 20mg /d (?changed by [facility]?). ~  FLP 4/07 on Simva20 showed TChol 123, TG 62, HDL 35, LDL 76... rec- continue same. ~  FLP 10/09 on Simva20 showed TChol 156, TG 84, HDL 36, LDL 103 ~  FLP 9//10 on Simva20 showed TChol 227, TG 79, HDL 36, LDL 176... ?taking regularly? incr Simva40. ~  FLP 1/12 on Simva40 showed TChol 164, TG 108, HDL 38, LDL 104 ~  1/13: Pt was changed to Cres20 in the interval (?by [facility]), not fasting today for lipid profile...  GERD (ICD-530.81) - uses OTC meds Prn... denies nausea, vomiting, heartburn, diarrhea, constipation, blood in stool, abdominal pain, swelling, gas...   IRRITABLE BOWEL SYNDROME (ICD-564.1) - on MIRALAX Prn... ~  last colonoscopy 8/07 by Dr Arlyce Dice showed divertics only...  PROSTATE CANCER (ICD-185) & RENAL INSUFFICIENCY (ICD-588.9) - he had radium seed implants 7/00 & is followed by Joneen Boers- we don't have any recent notes... ~  labs here 8/06 showed PSA = 0.11 ~  labs 10/09 showed PSA= 0.73... BUN= 18, Creat= 1.2 ~  labs 9/10 showed PSA= 1.10... slowly increasing PSA may mean recurrent Ca- rec> f/u w/ Urology. ~  labs 1/12 showed PSA= 1.23...  BUN= 22, Creat= 1.6 ~  Labs 1/13 showed PSA= 1.72... BUN= 25, Creat= 2.0.Marland KitchenMarland Kitchen Rec> decr Demadex back to prev dose (1-2 daily), minimize NSAIDs & needs f/u Urology.  DEGENERATIVE JOINT DISEASE (ICD-715.90) - on DICLOFENAC 50mg Bid.. he sees DrAplington/ Contractor at [employer]...  MEMORY LOSS (ICD-780.93) - he notes prob w/ memory... "I don't drive at night" (he got lost and son had to get him)... MMSE= 28/30 last yr... ~  CT Brain 4/08 showed atrophy & sm vessel dis, no acute changes...  ANXIETY (ICD-300.00) - on ALPRAZOLAM 0.25mg  Prn...   Past Surgical History  Procedure Date  . Inguinal  hernia repair   . Appendectomy   . Coronary artery bypass graft     Outpatient Encounter Prescriptions as of 08/02/2011  Medication Sig Dispense Refill  . ALPRAZolam (XANAX) 0.25 MG tablet take 1/2 to 1 tablet by mouth three times a day if needed  90 tablet  2  . aspirin 81 MG tablet Take 81 mg by mouth daily.        . diclofenac (VOLTAREN) 50 MG EC tablet Take 50 mg by mouth 2 (two) times daily.        . felodipine (PLENDIL) 10 MG 24 hr tablet Take 10 mg by mouth daily.        . metoprolol (TOPROL-XL) 50 MG 24 hr tablet 1/2 by mouth two times a day       . nitroGLYCERIN (NITROSTAT) 0.4 MG SL tablet Place 0.4 mg under the tongue every 5 (five) minutes as needed.        . polyethylene glycol (MIRALAX / GLYCOLAX) packet Take 17 g by mouth daily as needed. For constipation      . rosuvastatin (CRESTOR) 20 MG tablet Take 20 mg by mouth daily.        Marland Kitchen torsemide (DEMADEX) 20 MG tablet        . DISCONTD: torsemide (DEMADEX) 20 MG tablet take 1 tablet by mouth once daily  90 tablet  4  . DISCONTD: simvastatin (ZOCOR) 40 MG tablet Take 40 mg by mouth at bedtime.          Allergies  Allergen Reactions  . Sulfamethoxazole     REACTION: unspecified    Current Medications, Allergies, Past Medical History, Past Surgical History, Family History, and Social History were reviewed in [employer] record.    Review of Systems       See HPI - all other systems neg except as noted...      The patient complains of recent URI/ "cold" 1/13 + malaise, decreased hearing, dyspnea on exertion, constipation, nocturia, joint pain, stiffness, arthritis, and anxiety.  The patient denies fever, chills, sweats, anorexia, fatigue, weakness, weight loss, sleep disorder, blurring, diplopia, eye irritation, eye discharge, vision loss, eye pain, photophobia, earache, ear discharge, tinnitus, nosebleeds, sore throat, chest pain, palpitations, syncope, orthopnea, PND, peripheral edema, dyspnea at rest, excessive sputum, hemoptysis, wheezing, pleurisy, nausea, vomiting, diarrhea, abdominal pain, melena, hematochezia, jaundice, gas/bloating, indigestion/heartburn, dysphagia, odynophagia, dysuria, hematuria, urinary frequency, urinary hesitancy, incontinence, back pain, joint swelling, muscle cramps, muscle weakness, sciatica, restless legs, leg pain at night, leg pain with exertion, rash, itching, dryness, suspicious lesions, paralysis, paresthesias, seizures, tremors, vertigo, transient blindness, frequent falls, frequent headaches, difficulty walking, depression, memory loss, confusion, cold intolerance, heat intolerance, polydipsia, polyphagia, polyuria, unusual weight change, abnormal bruising, bleeding, enlarged lymph nodes, urticaria, allergic rash, hay fever, and recurrent infections.     Objective:   Physical Exam     WD, WN, 78 y/o WM in NAD... GENERAL:  Alert & oriented; pleasant & cooperative... HEENT:  [place]/AT, EOM-wnl, PERRLA, EACs-clear, TMs-wnl, NOSE-sl congestion, THROAT-clear w/o exud... NECK:  Supple w/ fairROM; no JVD; normal carotid impulses w/o bruits; no thyromegaly or nodules palpated; no lymphadenopathy. CHEST:  Few scat rhonchi & wheezes; no rales or signs of consolidation... HEART:  Regular Rhythm; Gr 1/6 SEM w/o rubs or gallops. ABDOMEN:  Soft w/ normal bowel sounds; non-tender, no  organomegaly or masses detected. EXT: without deformities, mild arthritic changes; no varicose veins/ +venous insuffic/ tr edema. NEURO:  CN's intact;  no focal neuro deficits.Marland KitchenMarland Kitchen  DERM:  No lesions noted; no rash etc...  RADIOLOGY DATA:  Reviewed in the EPIC EMR & discussed w/ the patient...    >>CXR shows pleural thickening w/ some calcif, no infiltrate or mass, sl elev right hemidiaph, s/p CABG & tort calcif Ao.  LABORATORY DATA:  Reviewed in the EPIC EMR & discussed w/ the patient...    >>BUN 25, Creat 2.0, BNP 229, others essent wnl...   Assessment & Plan:   URI/ Bronchitis>  We reviewed his symptoms 7 decided to treat w/ Depo120, ZPak, Mucinex, Fluids, Tylenol, etc...   HBP>  Controlled on Metop, Felodip, Torsemide> continue same but decr the Demadex dose...  ASHD>  Followed by Howell Rucks & stable on ASA + above rx; neg Myoview 2/12, rec to continue to stay active etc...  ASPVD>  Aware, no signif claud symptoms & asked to remain as active as poss...  CHOL>  On Simva40 & stable, continue same...  GI>  GERD/  IBS>  Doing satis w/ Miralax regularly...  Prostate Cancer & mild Renal Insuffic>  Very slowly rising PSA s/p radium seeds 2000 & he is followed by Urology but we don't have notes...  DJD>  Aware, he uses Voltaren prn...  Memory Loss>  Aware...   Patient's Medications  New Prescriptions   AZITHROMYCIN (ZITHROMAX) 250 MG TABLET    Take 2 tablets (500 mg) on  Day 1,  followed by 1 tablet (250 mg) once daily on Days 2 through 5.  Previous Medications   ALPRAZOLAM (XANAX) 0.25 MG TABLET    take 1/2 to 1 tablet by mouth three times a day if needed   ASPIRIN 81 MG TABLET    Take 81 mg by mouth daily.     DICLOFENAC (VOLTAREN) 50 MG EC TABLET    Take 50 mg by mouth 2 (two) times daily.     FELODIPINE (PLENDIL) 10 MG 24 HR TABLET    Take 10 mg by mouth daily.     METOPROLOL (TOPROL-XL) 50 MG 24 HR TABLET    1/2 by mouth two times a day    NITROGLYCERIN (NITROSTAT) 0.4 MG SL  TABLET    Place 0.4 mg under the tongue every 5 (five) minutes as needed.     POLYETHYLENE GLYCOL (MIRALAX / GLYCOLAX) PACKET    Take 17 g by mouth daily as needed. For constipation   ROSUVASTATIN (CRESTOR) 20 MG TABLET    Take 20 mg by mouth daily.    Modified Medications   Modified Medication Previous Medication   TORSEMIDE (DEMADEX) 20 MG TABLET torsemide (DEMADEX) 20 MG tablet      Take as directed by Dr. Tresa Endo    take 1 tablet by mouth once daily  Discontinued Medications   SIMVASTATIN (ZOCOR) 40 MG TABLET    Take 40 mg by mouth at bedtime.

## 2011-08-03 ENCOUNTER — Encounter: Payer: Self-pay | Admitting: Pulmonary Disease

## 2011-09-02 ENCOUNTER — Other Ambulatory Visit: Payer: Self-pay | Admitting: Pulmonary Disease

## 2011-09-06 ENCOUNTER — Other Ambulatory Visit: Payer: Self-pay | Admitting: Pulmonary Disease

## 2011-10-19 ENCOUNTER — Other Ambulatory Visit: Payer: Self-pay | Admitting: Pulmonary Disease

## 2011-11-29 ENCOUNTER — Other Ambulatory Visit: Payer: Self-pay | Admitting: Pulmonary Disease

## 2011-12-06 ENCOUNTER — Emergency Department (INDEPENDENT_AMBULATORY_CARE_PROVIDER_SITE_OTHER)
Admission: EM | Admit: 2011-12-06 | Discharge: 2011-12-06 | Disposition: A | Discharge status: Home / Self Care | Payer: Medicare PPO | Source: Home / Self Care | Attending: Emergency Medicine | Admitting: Emergency Medicine

## 2011-12-06 ENCOUNTER — Encounter (HOSPITAL_COMMUNITY): Payer: Self-pay

## 2011-12-06 DIAGNOSIS — H6120 Impacted cerumen, unspecified ear: Secondary | ICD-10-CM

## 2011-12-06 NOTE — ED Provider Notes (Signed)
History     CSN: [id number]  Arrival date & time 12/06/11  4098   First MD Initiated Contact with Patient 12/06/11 [phone]      Chief Complaint  Patient presents with  . Ear Problem    (Consider location/radiation/quality/duration/timing/severity/associated sxs/prior treatment) HPI Comments: Patient history of severe hearing loss presents for irrigation of both of his ears. He wears hearing aids, and states they're not working as well. Was evaluated at the hearing aid center, taut he had extensive sediment impaction, and was told to have them here gated. Patient is afebrile, no headaches, otorrhea, nausea, vomiting, fevers. He has no complaints. No change in mental status per caregiver.  ROS as noted in HPI. All other ROS negative.   Patient is a 78 y.o. male presenting with foreign body in ear. The history is provided by the patient and a caregiver.  Foreign Body in Ear This is a recurrent problem. The current episode started more than 1 week ago. The problem occurs constantly. The problem has not changed since onset.Pertinent negatives include no headaches. The symptoms are aggravated by nothing. The symptoms are relieved by nothing. He has tried nothing for the symptoms. The treatment provided no relief.    Past Medical History  Diagnosis Date  . Unspecified hearing loss   . Unspecified essential hypertension   . Coronary atherosclerosis of unspecified type of vessel, native or graft   . Peripheral vascular disease, unspecified   . Unspecified venous (peripheral) insufficiency   . Other and unspecified hyperlipidemia   . Esophageal reflux   . Irritable bowel syndrome   . Malignant neoplasm of prostate   . Unspecified disorder resulting from impaired renal function   . Osteoarthrosis, unspecified whether generalized or localized, unspecified site   . Memory loss   . Anxiety state, unspecified     Past Surgical History  Procedure Date  . Inguinal hernia repair   .  Appendectomy   . Coronary artery bypass graft   . Coronary angioplasty with stent placement     History reviewed. No pertinent family history.  History  Substance Use Topics  . Smoking status: Never Smoker   . Smokeless tobacco: Never Used  . Alcohol Use: No      Review of Systems  Neurological: Negative for headaches.    Allergies  Sulfamethoxazole  Home Medications   Current Outpatient Rx  Name Route Sig Dispense Refill  . ASPIRIN 81 MG PO TABS Oral Take 81 mg by mouth daily.      Marland Kitchen FELODIPINE ER 10 MG PO TB24  take 1 tablet by mouth once daily 90 tablet 4  . METOPROLOL SUCCINATE ER 50 MG PO TB24  1/2 by mouth two times a day     . NITROGLYCERIN 0.4 MG SL SUBL Sublingual Place 0.4 mg under the tongue every 5 (five) minutes as needed.      Marland Kitchen ROSUVASTATIN CALCIUM 20 MG PO TABS Oral Take 20 mg by mouth daily.      . TORSEMIDE 20 MG PO TABS  Take as directed by Dr. Tresa Endo    . ALPRAZOLAM 0.25 MG PO TABS  take 1/2 to 1 tablet by mouth three times a day if needed 90 tablet 2  . DICLOFENAC SODIUM 50 MG PO TBEC  take 1 tablet by mouth twice a day if needed for arthritis pain 180 tablet 4  . METOPROLOL TARTRATE 50 MG PO TABS  take 1 tablet by mouth twice a day 180 tablet 4  .  POLYETHYLENE GLYCOL 3350 PO PACK Oral Take 17 g by mouth daily as needed. For constipation      BP 108/48  Temp(Src) 97.6 F (36.4 C) (Oral)  Resp 20  SpO2 97%  Physical Exam  Nursing note and vitals reviewed. Constitutional: He is oriented to person, place, and time. He appears well-developed and well-nourished.  HENT:  Head: Normocephalic and atraumatic.  Right Ear: External ear normal.  Left Ear: External ear normal.       Bilateral serum and impaction. Hearing grossly intact bilaterally.  Eyes: Conjunctivae and EOM are normal. Pupils are equal, round, and reactive to light.  Neck: Normal range of motion.  Cardiovascular: Normal rate.   Pulmonary/Chest: Effort normal. No respiratory distress.    Abdominal: He exhibits no distension.  Musculoskeletal: Normal range of motion.  Neurological: He is alert and oriented to person, place, and time.  Skin: Skin is warm and dry.  Psychiatric: He has a normal mood and affect. His behavior is normal.    ED Course  Procedures (including critical care time)  Labs Reviewed - No data to display No results found.   1. Cerumen impaction       MDM  Irrigating ears. Will reevaluate.  He he patient's ears irrigated, with improvement. No complications per tech. Patient left prior to reevaluation by M.D., and getting discharge papers.  Luiz Blare, MD 12/06/11 1038

## 2011-12-06 NOTE — Discharge Instructions (Signed)
Cerumen Impaction  A cerumen impaction is when the wax in your ear forms a plug. This plug usually causes reduced hearing. Sometimes it also causes an earache or dizziness. Removing a cerumen impaction can be difficult and painful. The wax sticks to the ear canal. The canal is sensitive and bleeds easily. If you try to remove a heavy wax buildup with a cotton tipped swab, you may push it in further.  Irrigation with water, suction, and small ear curettes may be used to clear out the wax. If the impaction is fixed to the skin in the ear canal, ear drops may be needed for a few days to loosen the wax. People who build up a lot of wax frequently can use ear wax removal products available in your local drugstore.  SEEK MEDICAL CARE IF:    You develop an earache, increased hearing loss, or marked dizziness.  Document Released: 08/24/2004 Document Revised: 07/06/2011 Document Reviewed: 10/14/2009  [employer] Patient Information 2012 [employer].

## 2011-12-06 NOTE — ED Notes (Signed)
Pt states he wears hearing aids and when he went in to have them checked , they told him he needed to have his ears cleaned out.  Denies pain.

## 2012-01-11 ENCOUNTER — Other Ambulatory Visit: Payer: Self-pay | Admitting: Pulmonary Disease

## 2012-01-15 ENCOUNTER — Other Ambulatory Visit: Payer: Self-pay | Admitting: Pulmonary Disease

## 2012-01-19 ENCOUNTER — Telehealth: Payer: Self-pay | Admitting: Pulmonary Disease

## 2012-01-19 MED ORDER — QUETIAPINE FUMARATE 25 MG PO TABS: 25.0000 mg | ORAL_TABLET | Freq: Two times a day (BID) | ORAL | Status: DC

## 2012-01-19 NOTE — Telephone Encounter (Signed)
Called and spoke with pts son Billy Morgan---he stated that the pt's dementia is getting worse, he has been having a change in his behavior, esp toward his caregiver.  Son stated that the pt is reverting back to the 60's and 70's and is making sexual advances to his caregiver.  Son wanted to know if there is any med that they can try at this point to help the pt.  He does not want anything that is sedating to the pt or anything that will make him loopy.  Son stated that the pt has a caregiver for about 6-7 hours a day and he does still stay by himself at night and that he sleeps good.  SN please advise.  Ok to leave a VM on the son's phone if he does not answer.    Allergies  Allergen Reactions  . Sulfamethoxazole     REACTION: unspecified

## 2012-01-19 NOTE — Telephone Encounter (Signed)
Per SN---try seroquel 25mg   #60  1 po bid  And let us know how its working for the pt.  Called and spoke with pts son and he is aware  That rx that been sent to the pharmacy.  He is going to try this med for the pt and will update [place] with how it is working in the next week or two.

## 2012-02-02 ENCOUNTER — Other Ambulatory Visit: Payer: Self-pay | Admitting: Pulmonary Disease

## 2012-02-02 ENCOUNTER — Telehealth: Payer: Self-pay | Admitting: Pulmonary Disease

## 2012-02-02 DIAGNOSIS — E785 Hyperlipidemia, unspecified: Secondary | ICD-10-CM

## 2012-02-02 NOTE — Telephone Encounter (Signed)
I spoke with spouse and she scheduled pt to come in and see TP on 02/06/12 at 4:00. She states he has had increase swelling and is wanting lab work for this to be checked that day. Nothing further was needed

## 2012-02-02 NOTE — Telephone Encounter (Signed)
Pt needs an appt with SN and he can come in for his fasting lipid panel.  This order has been placed for the pt.  thanks

## 2012-02-02 NOTE — Telephone Encounter (Signed)
Leigh please advise if you would know what labs pt will need to have, thanks

## 2012-02-06 ENCOUNTER — Other Ambulatory Visit (INDEPENDENT_AMBULATORY_CARE_PROVIDER_SITE_OTHER): Payer: Medicare PPO

## 2012-02-06 ENCOUNTER — Ambulatory Visit (INDEPENDENT_AMBULATORY_CARE_PROVIDER_SITE_OTHER)
Admission: RE | Admit: 2012-02-06 | Discharge: 2012-02-06 | Disposition: A | Discharge status: Home / Self Care | Payer: Medicare PPO | Source: Ambulatory Visit | Attending: Adult Health | Admitting: Adult Health

## 2012-02-06 ENCOUNTER — Encounter: Payer: Self-pay | Admitting: Adult Health

## 2012-02-06 ENCOUNTER — Ambulatory Visit (INDEPENDENT_AMBULATORY_CARE_PROVIDER_SITE_OTHER): Payer: Medicare PPO | Admitting: Adult Health

## 2012-02-06 VITALS — BP 102/62 | HR 75 | Temp 97.0°F | Ht 72.0 in | Wt 198.2 lb

## 2012-02-06 DIAGNOSIS — R0989 Other specified symptoms and signs involving the circulatory and respiratory systems: Secondary | ICD-10-CM

## 2012-02-06 DIAGNOSIS — I872 Venous insufficiency (chronic) (peripheral): Secondary | ICD-10-CM

## 2012-02-06 DIAGNOSIS — N259 Disorder resulting from impaired renal tubular function, unspecified: Secondary | ICD-10-CM

## 2012-02-06 DIAGNOSIS — R0609 Other forms of dyspnea: Secondary | ICD-10-CM

## 2012-02-06 DIAGNOSIS — E785 Hyperlipidemia, unspecified: Secondary | ICD-10-CM

## 2012-02-06 DIAGNOSIS — R0600 Dyspnea, unspecified: Secondary | ICD-10-CM

## 2012-02-06 LAB — CBC WITH DIFFERENTIAL/PLATELET
Basophils Relative: 0.4 % (ref 0.0–3.0)
Eosinophils Absolute: 0.1 10*3/uL (ref 0.0–0.7)
Eosinophils Relative: 1.4 % (ref 0.0–5.0)
Hemoglobin: 13.5 g/dL (ref 13.0–17.0)
Lymphocytes Relative: 22.6 % (ref 12.0–46.0)
Monocytes Relative: 11.5 % (ref 3.0–12.0)
Neutro Abs: 5.9 10*3/uL (ref 1.4–7.7)
Neutrophils Relative %: 64.1 % (ref 43.0–77.0)
RBC: 4.14 Mil/uL — ABNORMAL LOW (ref 4.22–5.81)
WBC: 9.1 10*3/uL (ref 4.5–10.5)

## 2012-02-06 LAB — BASIC METABOLIC PANEL
Calcium: 9 mg/dL (ref 8.4–10.5)
GFR: 37.23 mL/min — ABNORMAL LOW (ref >60.00)
Glucose, Bld: 109 mg/dL — ABNORMAL HIGH (ref 70–99)
Potassium: 4.1 mEq/L (ref 3.5–5.1)
Sodium: 137 mEq/L (ref 135–145)

## 2012-02-06 NOTE — Patient Instructions (Addendum)
Low salt diet  I will call with labs  follow up Dr. Kriste Basque  In 1 month and As needed   Please contact office for sooner follow up if symptoms do not improve or worsen or seek emergency care

## 2012-02-06 NOTE — Progress Notes (Signed)
Subjective:    Patient ID: Billy Morgan, male    DOB: 19-Jan-1918, 78 y.o.   MRN: [id number]  HPI 78 y/o WM    ~  August 16, 2010:  16 month ROV & review> he is quite remarkable for 78 y/o- still lives in own home & cares for wife w/ help of CNA's that they hire in shifts, his son supervises everyting but confirms that he is doing satis... pt has no new complaints or concerns... +HOH;  BP controlled on meds;  denies CP, palpit, SOB, edema, etc;  Chol looks good on Simva40;  GI & GU are stable;  DJD is mod & well managed by the diclofenac...   ~  January 23, 2011:  14mo ROV & he continues to do remarkably well for 89, no new complaints or concerns; he remains active, in his own home w/ caregivers helping daily, family nearby & on his own at night w/o problems identified;  Wife passed away 11-30-2022 on home hospice after hosp 3/12 w/ stroke etc.    Followed by [facility], seen 2/12 & his note reviewed; Myoview was neg- no scar or ischemia & EF=74%; no changes made...  ~  August 02, 2011:  53mo ROV & [name] is c/o URI "stopped up" he says; notes "chest cold" x 3d w/ chest congestion, "tight", mild cough, yellow phlegm, hoarse; denies f/c/s, SOB, edema; he had Flu vaccine 11/12;  We discussed RX w/ Depo120, ZPak, Mucinex, Fluids, Tylenol, etc...    He reports going to ER 9/12 w/ left flank pain per ER notes reviewed> Exam was neg; Urine was clear; Labs were essentially wnl (K 3.4, Creat 1.5, WBC 10.5); CT Abd done & no acute prob ident (see below for mult incidental findings in the 78 y/o man); pain was felt to be musculoskeletal & given Vicodin> he improved over time & resolved...     HBP> on MetoprololER50-1/2Bid, Felodipine10, Demadex20- DrKelly incr to 2/d & extra if needed; BP= 118/60 & they bring an extensive BP log but ave pressures are ok & he denies CP, palpit, dizzy, syncope, SOB; they report some edema which prompted drKelly to incr the diuretic but Creat up to 2.0 today & cautioned to minimize  the diuretic- avoid sodium, elev legs, wear support hose...    CAD, s/p CABG x5 in 1997> on ASA81+ above meds, followed by [facility] & seen 10/12, stable & no changes made; son tells me he was recently seen w/ incr edema & weight (wt today= 190# up 11#)==> told to increase the Demadex & we will fax copies of labs to [facility] (note BNP today= 229)...    PAD> on ASA81 & followed by [facility]; prev sonar & CTAbd revealed extensive atherosclerotic changes, tortuous Ao & iliacs, no aneurysm...    Hyperlipid> on Crestor20 now; prev on Simva40 & ?changed by [facility]?; last FLP on Simva 1/12 as noted below, not fasting today...    GI> GERD, IBS> on Prilosec OTC prn & Miralax prn; they deny GI issues at present...    GU> hx prostate ca & renal insuffic> treated w/ radium seed implants in 2000; slowly rising PSA noted but we don't have notes from Urology [facility]); ?when he was last seen?  As noted he has mild renal insuffic w/ baseline Creat ~1.5-1.6 but labs today 2.0 after incr in diuretic; asked to decr Demadex back to prev level & caution w/ NSAIDs...    DJD> on Voltaren 50Bid as needed & asked to minimize the  NSAID rx, use Tylenol first...    Memory loss/ anxiety> on Xanax0.25 prn; he manages pretty well for 89 w/ son's help...   02/06/12 Acute OV  Pt presents accompanied by his LPN caregiver for an acute ov  Complains over last 7-10 days legs have been more swollen than usual right > left.  Initially had some increased dyspnea but this improved after diuretics were increased.  Pt says he feels fine except legs are swollen.  No known injury, calf pain or chest pain.  Says he was instructed to increase his lasix by Dr. Tresa Endo and a Dr. Skipper Cliche" -son in law To 100mg  over last 5 days with 1 day at 160mg  .  Swelling is some better but not resolved. UOP did not increase as they expected.  Last scr was 2.0  08/2011.  Denies fever, chest pain , abd pain or n/v.  Weight has trended up over last year, and is up 8 lbs since  last ov in January.   Problem List:   HEARING LOSS (ICD-389.9) - he has hearing aides...  HYPERTENSION (ICD-401.9) - on METOPROLOL 50mg - taking 1/2Bid,  FELODIPINE 10mg /d,  TORSEMIDE 20mg - increased by [facility] to 2-4 daily ...  ~  6/12: BP= 128/62 & similar at home, takes meds regularly & tolerates well... denies HA, visual changes, CP, palipit, dizziness, syncope, dyspnea, edema, etc...  ~  12/12: BP= 118/60 & DrKelly recently increased Demadex due to swelling & wt gain; Creat up to 2.0 & asked to decr the demadex to prev level (?2daily).  ATHEROSCLEROTIC HEART DISEASE (ICD-414.00) - on ASA 81mg /d; followed by [facility]> . he had CABG x5 in 1997 by DrBartle; denies CP/ angina, palpit, SOB, etc... ~  NuclearStressTest 7/07 was neg- no ischemia or infarct... non-gated due to ectopy. ~  2DEcho 11/07 showed norm LV wall thickness & LVF, mild RA & LA dil, mild MR... ~  Myoview 2/12 was neg> no scar or ischemia, & EF=74%... ~  10/12: DrKelly's note indicates new T-inversions in III & avF not seen before, pt asymptomatic & they are following.  PERIPHERAL VASCULAR DISEASE (ICD-443.9) - on ASA 81mg /d and followed by [facility] as noted... ~  Abd Sonar 5/01 showed tortuous Ao w/ mod atherosclerotic changes, no aneurysm. ~  CT Abd 9/12 via ER for flank pain showed incidental finding of extensive atherosclerotic changes in Ao & iliacs, no aneurysm.  VENOUS INSUFFICIENCY (ICD-459.81)  HYPERLIPIDEMIA (ICD-272.4) - prev on Simva40, now lists CRESTOR 20mg /d (?changed by [facility]?). ~  FLP 4/07 on Simva20 showed TChol 123, TG 62, HDL 35, LDL 76... rec- continue same. ~  FLP 10/09 on Simva20 showed TChol 156, TG 84, HDL 36, LDL 103 ~  FLP 9//10 on Simva20 showed TChol 227, TG 79, HDL 36, LDL 176... ?taking regularly? incr Simva40. ~  FLP 1/12 on Simva40 showed TChol 164, TG 108, HDL 38, LDL 104 ~  1/13: Pt was changed to Cres20 in the interval (?by [facility]), not fasting today for lipid profile...  GERD  (ICD-530.81) - uses OTC meds Prn... denies nausea, vomiting, heartburn, diarrhea, constipation, blood in stool, abdominal pain, swelling, gas...   IRRITABLE BOWEL SYNDROME (ICD-564.1) - on MIRALAX Prn... ~  last colonoscopy 8/07 by Dr Arlyce Dice showed divertics only...  PROSTATE CANCER (ICD-185) & RENAL INSUFFICIENCY (ICD-588.9) - he had radium seed implants 7/00 & is followed by Joneen Boers- we don't have any recent notes... ~  labs here 8/06 showed PSA = 0.11 ~  labs 10/09 showed PSA= 0.73... BUN=  18, Creat= 1.2 ~  labs 9/10 showed PSA= 1.10... slowly increasing PSA may mean recurrent Ca- rec> f/u w/ Urology. ~  labs 1/12 showed PSA= 1.23... BUN= 22, Creat= 1.6 ~  Labs 1/13 showed PSA= 1.72... BUN= 25, Creat= 2.0.Marland KitchenMarland Kitchen Rec> decr Demadex back to prev dose (1-2 daily), minimize NSAIDs & needs f/u Urology.  DEGENERATIVE JOINT DISEASE (ICD-715.90) - on DICLOFENAC 50mg Bid.. he sees DrAplington/ Contractor at [employer]...  MEMORY LOSS (ICD-780.93) - he notes prob w/ memory... "I don't drive at night" (he got lost and son had to get him)... MMSE= 28/30 last yr... ~  CT Brain 4/08 showed atrophy & sm vessel dis, no acute changes...  ANXIETY (ICD-300.00) - on ALPRAZOLAM 0.25mg  Prn...   Past Surgical History  Procedure Date  . Inguinal hernia repair   . Appendectomy   . Coronary artery bypass graft   . Coronary angioplasty with stent placement     Outpatient Encounter Prescriptions as of 02/06/2012  Medication Sig Dispense Refill  . ALPRAZolam (XANAX) 0.25 MG tablet take 1/2 to 1 tablet by mouth three times a day if needed  90 tablet  1  . aspirin 81 MG tablet Take 81 mg by mouth daily.        . betamethasone valerate lotion (VALISONE) 0.1 % Apply as directed      . clobetasol (TEMOVATE) 0.05 % external solution Apply to scalp as directed      . diclofenac (VOLTAREN) 50 MG EC tablet take 1 tablet by mouth twice a day if needed for arthritis pain  180 tablet  4  . felodipine (PLENDIL) 10 MG 24 hr  tablet take 1 tablet by mouth once daily  90 tablet  4  . ketoconazole (NIZORAL) 2 % cream Apply as directed      . metoprolol (LOPRESSOR) 50 MG tablet take 1 tablet by mouth twice a day  180 tablet  4  . metoprolol (TOPROL-XL) 50 MG 24 hr tablet 1/2 by mouth two times a day       . Multiple Vitamin (MULTIVITAMIN) tablet Take 1 tablet by mouth daily.      . nitroGLYCERIN (NITROSTAT) 0.4 MG SL tablet Place 0.4 mg under the tongue every 5 (five) minutes as needed.        Marland Kitchen OVER THE COUNTER MEDICATION every other day.      . polyethylene glycol (MIRALAX / GLYCOLAX) packet Take 17 g by mouth daily as needed. For constipation      . QUEtiapine (SEROQUEL) 25 MG tablet Take 25 mg by mouth at bedtime.      . rosuvastatin (CRESTOR) 20 MG tablet Take 20 mg by mouth daily.        Marland Kitchen torsemide (DEMADEX) 20 MG tablet take 1 tablet by mouth once daily  90 tablet  4  . DISCONTD: QUEtiapine (SEROQUEL) 25 MG tablet Take 1 tablet (25 mg total) by mouth 2 (two) times daily.  60 tablet  5  . DISCONTD: torsemide (DEMADEX) 20 MG tablet Take as directed by Dr. Tresa Endo        Allergies  Allergen Reactions  . Sulfamethoxazole     REACTION: unspecified    Current Medications, Allergies, Past Medical History, Past Surgical History, Family History, and Social History were reviewed in [employer] record.    Review of Systems       Constitutional:   No  weight loss, night sweats,  Fevers, chills ,+ fatigue, or  lassitude.  HEENT:   No  headaches,  Difficulty swallowing,  Tooth/dental problems, or  Sore throat,                No sneezing, itching, ear ache, nasal congestion, post nasal drip,   CV:  No chest pain,  Orthopnea, PND, swelling in lower extremities, anasarca, dizziness, palpitations, syncope.   GI  No heartburn, indigestion, abdominal pain, nausea, vomiting, diarrhea, change in bowel habits, loss of appetite, bloody stools.   Resp: No shortness of breath with exertion or at rest.   No excess mucus, no productive cough,  No non-productive cough,  No coughing up of blood.  No change in color of mucus.  No wheezing.  No chest wall deformity  Skin: no rash or lesions.  GU: no dysuria, change in color of urine, no urgency or frequency.  No flank pain, no hematuria   MS:  No joint pain or swelling.  No decreased range of motion.  No back pain.  Psych:  No change in mood or affect. No depression or anxiety.  No memory loss.        Objective:   Physical Exam     WD, WN, 78 y/o WM in NAD... GENERAL:  Alert & oriented; pleasant & cooperative... HEENT:  [place]/AT,  EACs-clear, TMs-wnl, NOSE-sl congestion, THROAT-clear w/o exud... NECK:  Supple w/ fairROM; no JVD; normal carotid impulses w/o bruits; no thyromegaly  palpated; no lymphadenopathy. Small mobile soft pebble sized cyst along right anterior neck, no redness or tenderness  CHEST:  Coarse BS  no rales or signs of consolidation... HEART:  Regular Rhythm; Gr 1/6 SEM w/o rubs or gallops. ABDOMEN:  Soft w/ normal bowel sounds; non-tender, no organomegaly or masses detected. EXT: without deformities, mild arthritic changes; no varicose veins/ +venous insuffic/ tr-1 edema., no redness , neg homans sign.  NEURO:    no focal neuro deficits... DERM:  No lesions noted; no rash etc...   Assessment & Plan:

## 2012-02-07 LAB — HEPATIC FUNCTION PANEL
AST: 40 U/L — ABNORMAL HIGH (ref 0–37)
Albumin: 4 g/dL (ref 3.5–5.2)
Total Bilirubin: 0.7 mg/dL (ref 0.3–1.2)

## 2012-02-07 LAB — BRAIN NATRIURETIC PEPTIDE: Pro B Natriuretic peptide (BNP): 180 pg/mL — ABNORMAL HIGH (ref 0.0–100.0)

## 2012-02-07 LAB — LIPID PANEL
HDL: 35.9 mg/dL — ABNORMAL LOW (ref >39.00)
Total CHOL/HDL Ratio: 3
Triglycerides: 165 mg/dL — ABNORMAL HIGH (ref 0.0–149.0)
VLDL: 33 mg/dL (ref 0.0–40.0)

## 2012-02-08 ENCOUNTER — Telehealth: Payer: Self-pay | Admitting: Pulmonary Disease

## 2012-02-08 NOTE — Assessment & Plan Note (Addendum)
Worsening leg edema with underlying venous insufficiency w/ only minimal response with large doses of diuretics  CXR today with no acute process Labs unrevealing with BNP ~180  Scr at baseline at 1.8  Will defer back to DR. Kelly for further instruction on diuretic management myoview last year with nml EF  No echo reports are available in records  Case discussed with Dr. Kriste Basque

## 2012-02-08 NOTE — Assessment & Plan Note (Signed)
Scr at baseline at 1.8

## 2012-02-08 NOTE — Telephone Encounter (Signed)
Notes Recorded by Julio Sicks, NP on 02/08/2012 at 9:24 AM cholestrol is ok although this was a future labs as he was not fasting  Cont on current regimen  Notes Recorded by Julio Sicks, NP on 02/08/2012 at 7:46 AM Labs are ok Nothing to explain his swelling /fluid retention  Cont w/ prev recs to get ov with his cardiologist DR. Kelly  Please contact office for sooner follow up if symptoms do not improve or worsen or seek emergency care    Son is aware of results and asked a copy be mailed to his home. This has been done

## 2012-03-25 ENCOUNTER — Ambulatory Visit: Payer: Medicare PPO | Admitting: Pulmonary Disease

## 2012-03-26 ENCOUNTER — Telehealth: Payer: Self-pay | Admitting: Pulmonary Disease

## 2012-03-26 NOTE — Telephone Encounter (Signed)
SN, before I call them back to find out what labs, are you okay with this? Please advise if okay for pt to have labs drawn here for cards, thanks!

## 2012-03-26 NOTE — Telephone Encounter (Signed)
Per SN----ok to get a list of labs that they want done and pt will need to reschedule his appt  With SN and we will do them at that appt.  thanks

## 2012-03-26 NOTE — Telephone Encounter (Signed)
Spoke with Clydie Braun and she states that the pt needs BMP done this Thurs b/c they are holding his torsemide to see if this helps improve renal fx. Please advise if okay to have this done w/o an ov. Thanks!

## 2012-03-27 ENCOUNTER — Other Ambulatory Visit: Payer: Self-pay | Admitting: Pulmonary Disease

## 2012-03-27 DIAGNOSIS — I10 Essential (primary) hypertension: Secondary | ICD-10-CM

## 2012-03-27 NOTE — Telephone Encounter (Signed)
Called and spoke with Billy Morgan and she is aware that labs are in the computer for bmp for pt on Thursday.  Billy Morgan requested that these lab results be faxed to [phone]  ATTN:  Billy Morgan.  Called and spoke with Billy Morgan son and he is aware of appt scheduled for the pt on 10-8 to see SN at 64.

## 2012-03-27 NOTE — Telephone Encounter (Signed)
Its fine for the pt to come to the lab for the BMP but the pt will still need to schedule an appt with SN to be seen.  thanks

## 2012-03-28 ENCOUNTER — Other Ambulatory Visit (INDEPENDENT_AMBULATORY_CARE_PROVIDER_SITE_OTHER): Payer: Medicare PPO

## 2012-03-28 DIAGNOSIS — I10 Essential (primary) hypertension: Secondary | ICD-10-CM

## 2012-03-28 LAB — BASIC METABOLIC PANEL
CO2: 30 mEq/L (ref 19–32)
Calcium: 9.2 mg/dL (ref 8.4–10.5)
GFR: 36.3 mL/min — ABNORMAL LOW (ref >60.00)
Glucose, Bld: 140 mg/dL — ABNORMAL HIGH (ref 70–99)
Potassium: 4.6 mEq/L (ref 3.5–5.1)
Sodium: 137 mEq/L (ref 135–145)

## 2012-03-29 NOTE — Telephone Encounter (Signed)
Labs have been faxed back to karen at [facility] cardiology.

## 2012-04-02 NOTE — Telephone Encounter (Signed)
Nothing further needed in this message.

## 2012-04-02 NOTE — Telephone Encounter (Signed)
Leigh, do you still need this phone msg or can we close it?

## 2012-05-07 ENCOUNTER — Ambulatory Visit: Payer: Medicare PPO | Admitting: Pulmonary Disease

## 2012-06-20 ENCOUNTER — Emergency Department (HOSPITAL_COMMUNITY)
Admission: EM | Admit: 2012-06-20 | Discharge: 2012-06-20 | Disposition: A | Discharge status: Home / Self Care | Payer: Medicare PPO | Attending: Emergency Medicine | Admitting: Emergency Medicine

## 2012-06-20 ENCOUNTER — Emergency Department (HOSPITAL_COMMUNITY): Payer: Medicare PPO

## 2012-06-20 ENCOUNTER — Encounter (HOSPITAL_COMMUNITY): Payer: Self-pay | Admitting: *Deleted

## 2012-06-20 DIAGNOSIS — E785 Hyperlipidemia, unspecified: Secondary | ICD-10-CM | POA: Insufficient documentation

## 2012-06-20 DIAGNOSIS — Z8719 Personal history of other diseases of the digestive system: Secondary | ICD-10-CM | POA: Insufficient documentation

## 2012-06-20 DIAGNOSIS — Z7982 Long term (current) use of aspirin: Secondary | ICD-10-CM | POA: Insufficient documentation

## 2012-06-20 DIAGNOSIS — I10 Essential (primary) hypertension: Secondary | ICD-10-CM | POA: Insufficient documentation

## 2012-06-20 DIAGNOSIS — N179 Acute kidney failure, unspecified: Secondary | ICD-10-CM

## 2012-06-20 DIAGNOSIS — R413 Other amnesia: Secondary | ICD-10-CM | POA: Insufficient documentation

## 2012-06-20 DIAGNOSIS — E876 Hypokalemia: Secondary | ICD-10-CM | POA: Insufficient documentation

## 2012-06-20 DIAGNOSIS — Z9861 Coronary angioplasty status: Secondary | ICD-10-CM | POA: Insufficient documentation

## 2012-06-20 DIAGNOSIS — I509 Heart failure, unspecified: Secondary | ICD-10-CM | POA: Insufficient documentation

## 2012-06-20 DIAGNOSIS — C61 Malignant neoplasm of prostate: Secondary | ICD-10-CM | POA: Insufficient documentation

## 2012-06-20 DIAGNOSIS — I2510 Atherosclerotic heart disease of native coronary artery without angina pectoris: Secondary | ICD-10-CM | POA: Insufficient documentation

## 2012-06-20 DIAGNOSIS — Z8679 Personal history of other diseases of the circulatory system: Secondary | ICD-10-CM | POA: Insufficient documentation

## 2012-06-20 DIAGNOSIS — R0609 Other forms of dyspnea: Secondary | ICD-10-CM | POA: Insufficient documentation

## 2012-06-20 DIAGNOSIS — F411 Generalized anxiety disorder: Secondary | ICD-10-CM | POA: Insufficient documentation

## 2012-06-20 DIAGNOSIS — I739 Peripheral vascular disease, unspecified: Secondary | ICD-10-CM | POA: Insufficient documentation

## 2012-06-20 DIAGNOSIS — Z79899 Other long term (current) drug therapy: Secondary | ICD-10-CM | POA: Insufficient documentation

## 2012-06-20 DIAGNOSIS — R0600 Dyspnea, unspecified: Secondary | ICD-10-CM

## 2012-06-20 DIAGNOSIS — R0989 Other specified symptoms and signs involving the circulatory and respiratory systems: Secondary | ICD-10-CM | POA: Insufficient documentation

## 2012-06-20 DIAGNOSIS — H9190 Unspecified hearing loss, unspecified ear: Secondary | ICD-10-CM | POA: Insufficient documentation

## 2012-06-20 LAB — COMPREHENSIVE METABOLIC PANEL
ALT: 25 U/L (ref 0–53)
AST: 32 U/L (ref 0–37)
Albumin: 3.8 g/dL (ref 3.5–5.2)
Alkaline Phosphatase: 49 U/L (ref 39–117)
BUN: 75 mg/dL — ABNORMAL HIGH (ref 6–23)
CO2: 32 mEq/L (ref 19–32)
Calcium: 9.3 mg/dL (ref 8.4–10.5)
Chloride: 88 mEq/L — ABNORMAL LOW (ref 96–112)
Creatinine, Ser: 2.75 mg/dL — ABNORMAL HIGH (ref 0.50–1.35)
GFR calc Af Amer: 22 mL/min — ABNORMAL LOW (ref >90)
GFR calc non Af Amer: 19 mL/min — ABNORMAL LOW (ref >90)
Glucose, Bld: 137 mg/dL — ABNORMAL HIGH (ref 70–99)
Potassium: 3.2 mEq/L — ABNORMAL LOW (ref 3.5–5.1)
Sodium: 133 mEq/L — ABNORMAL LOW (ref 135–145)
Total Bilirubin: 0.7 mg/dL (ref 0.3–1.2)
Total Protein: 7.5 g/dL (ref 6.0–8.3)

## 2012-06-20 LAB — CBC WITH DIFFERENTIAL/PLATELET
Basophils Absolute: 0 10*3/uL (ref 0.0–0.1)
Basophils Relative: 0 % (ref 0–1)
Eosinophils Absolute: 0.1 10*3/uL (ref 0.0–0.7)
Eosinophils Relative: 1 % (ref 0–5)
HCT: 42.1 % (ref 39.0–52.0)
Hemoglobin: 14.8 g/dL (ref 13.0–17.0)
Lymphocytes Relative: 14 % (ref 12–46)
Lymphs Abs: 1.3 10*3/uL (ref 0.7–4.0)
MCH: 32.8 pg (ref 26.0–34.0)
MCHC: 35.2 g/dL (ref 30.0–36.0)
MCV: 93.3 fL (ref 78.0–100.0)
Monocytes Absolute: 0.7 10*3/uL (ref 0.1–1.0)
Monocytes Relative: 8 % (ref 3–12)
Neutro Abs: 7.2 10*3/uL (ref 1.7–7.7)
Neutrophils Relative %: 77 % (ref 43–77)
Platelets: 179 10*3/uL (ref 150–400)
RBC: 4.51 MIL/uL (ref 4.22–5.81)
RDW: 12.3 % (ref 11.5–15.5)
WBC: 9.3 10*3/uL (ref 4.0–10.5)

## 2012-06-20 LAB — POCT I-STAT TROPONIN I: Troponin i, poc: 0.1 ng/mL (ref 0.00–0.08)

## 2012-06-20 LAB — PRO B NATRIURETIC PEPTIDE: Pro B Natriuretic peptide (BNP): 277.2 pg/mL (ref 0–450)

## 2012-06-20 LAB — TROPONIN I: Troponin I: <0.3 ng/mL (ref <0.30)

## 2012-06-20 MED ORDER — ASPIRIN 81 MG PO CHEW
324.0000 mg | CHEWABLE_TABLET | Freq: Once | ORAL | Status: AC
  Administered 2012-06-20: 324 mg via ORAL
  Filled 2012-06-20: qty 4

## 2012-06-20 MED ORDER — POTASSIUM CHLORIDE CRYS ER 20 MEQ PO TBCR
40.0000 meq | EXTENDED_RELEASE_TABLET | Freq: Once | ORAL | Status: AC
  Administered 2012-06-20: 40 meq via ORAL
  Filled 2012-06-20: qty 2

## 2012-06-20 NOTE — ED Notes (Signed)
X-ray at bedside

## 2012-06-20 NOTE — ED Notes (Signed)
Family at bedside. 

## 2012-06-20 NOTE — ED Notes (Signed)
Pt's caregiver and son report pt has had shob over last few days. Hx CHF, weight has been fluctuating greater than 2lb a day. Shob worse with exertion. 96% O2 sat on RA in triage.

## 2012-06-20 NOTE — ED Notes (Signed)
Stood pt to weigh, pt stated he became shob just from standing from w/c and sitting back down and felt better breathing with his mouth open. Lowest O2 sat reading at that time was 94%. Pt easily and quickly recovered after sitting down.

## 2012-06-20 NOTE — ED Notes (Signed)
Pt family at bedside. Pt lives alone and family visits daily. Daughter noted pt weight has been fluctuating the past few days and pt has become increasingly short of breath. Pt HOH.. No complain of pain. No shortness of breath at rest.

## 2012-06-20 NOTE — ED Provider Notes (Signed)
History    89yM with dyspnea. Onset about 2d ago. Intermittent with exertion and improved with rest. Denies CP, n/v, palpitations or diaphoresis. No significant increase in swelling recently but family reports weight fluctuating much more than typically does. Normally +/- 2lbs from 180. Two days ago 185 and 190 yesterday. Normally takes 1-4 20mg  tabs of torsemide depending on how he feels. Daughter has been giving more past 2d because of SOB. No fever or chills. No urinary complaints. No cough.   CSN: [id number]  Arrival date & time 06/20/12  1031   First MD Initiated Contact with Patient 06/20/12 1102      Chief Complaint  Patient presents with  . Shortness of Breath  . Congestive Heart Failure    (Consider location/radiation/quality/duration/timing/severity/associated sxs/prior treatment) HPI  Past Medical History  Diagnosis Date  . Unspecified hearing loss   . Unspecified essential hypertension   . Coronary atherosclerosis of unspecified type of vessel, native or graft   . Peripheral vascular disease, unspecified   . Unspecified venous (peripheral) insufficiency   . Other and unspecified hyperlipidemia   . Esophageal reflux   . Irritable bowel syndrome   . Malignant neoplasm of prostate   . Unspecified disorder resulting from impaired renal function   . Osteoarthrosis, unspecified whether generalized or localized, unspecified site   . Memory loss   . Anxiety state, unspecified   . CHF (congestive heart failure)     Past Surgical History  Procedure Date  . Inguinal hernia repair   . Appendectomy   . Coronary artery bypass graft   . Coronary angioplasty with stent placement     No family history on file.  History  Substance Use Topics  . Smoking status: Never Smoker   . Smokeless tobacco: Never Used  . Alcohol Use: No      Review of Systems   Review of symptoms negative unless otherwise noted in HPI.   Allergies  Sulfamethoxazole  Home Medications    Current Outpatient Rx  Name  Route  Sig  Dispense  Refill  . ALPRAZOLAM 0.25 MG PO TABS      take 1/2 to 1 tablet by mouth three times a day if needed   90 tablet   1   . ASPIRIN 81 MG PO TABS   Oral   Take 81 mg by mouth daily.           Marland Kitchen BETAMETHASONE VALERATE 0.1 % EX LOTN      Apply as directed         . CLOBETASOL PROPIONATE 0.05 % EX SOLN      Apply to scalp as directed         . DICLOFENAC SODIUM 50 MG PO TBEC      take 1 tablet by mouth twice a day if needed for arthritis pain   180 tablet   4   . FELODIPINE ER 10 MG PO TB24      take 1 tablet by mouth once daily   90 tablet   4   . KETOCONAZOLE 2 % EX CREA      Apply as directed         . METOPROLOL TARTRATE 50 MG PO TABS      take 1 tablet by mouth twice a day   180 tablet   4   . METOPROLOL SUCCINATE ER 50 MG PO TB24      1/2 by mouth two times a day          .  ONE-DAILY MULTI VITAMINS PO TABS   Oral   Take 1 tablet by mouth daily.         Marland Kitchen NITROGLYCERIN 0.4 MG SL SUBL   Sublingual   Place 0.4 mg under the tongue every 5 (five) minutes as needed.           Marland Kitchen OVER THE COUNTER MEDICATION      every other day.         Marland Kitchen POLYETHYLENE GLYCOL 3350 PO PACK   Oral   Take 17 g by mouth daily as needed. For constipation         . QUETIAPINE FUMARATE 25 MG PO TABS   Oral   Take 25 mg by mouth at bedtime.         Marland Kitchen ROSUVASTATIN CALCIUM 20 MG PO TABS   Oral   Take 20 mg by mouth daily.           . TORSEMIDE 20 MG PO TABS      take 1 tablet by mouth once daily   90 tablet   4     BP 111/59  Pulse 97  Temp 97.6 F (36.4 C) (Oral)  Resp 20  Wt 180 lb (81.647 kg)  SpO2 94%  Physical Exam  Nursing note and vitals reviewed. Constitutional: He appears well-developed and well-nourished. No distress.       Laying in bed. NAD.   HENT:  Head: Normocephalic and atraumatic.  Eyes: Conjunctivae normal are normal. Right eye exhibits no discharge. Left eye exhibits  no discharge.  Neck: Neck supple.  Cardiovascular: Normal rate, regular rhythm and normal heart sounds.  Exam reveals no gallop and no friction rub.   No murmur heard.      Sternotomy scar. RRR. No murmur appreciated.   Pulmonary/Chest: Effort normal and breath sounds normal. No respiratory distress.  Abdominal: Soft. He exhibits no distension. There is no tenderness.  Musculoskeletal: He exhibits no edema and no tenderness.       Lower extremities symmetric as compared to each other. On edema.  No calf tenderness. Negative Homan's. No palpable cords. Well healed scars RLE.   Neurological: He is alert.  Skin: Skin is warm and dry. He is not diaphoretic.  Psychiatric: He has a normal mood and affect. His behavior is normal. Thought content normal.    ED Course  Procedures (including critical care time)  Labs Reviewed  COMPREHENSIVE METABOLIC PANEL - Abnormal; Notable for the following:    Sodium 133 (*)     Potassium 3.2 (*)     Chloride 88 (*)     Glucose, Bld 137 (*)     BUN 75 (*)     Creatinine, Ser 2.75 (*)     GFR calc non Af Amer 19 (*)     GFR calc Af Amer 22 (*)     All other components within normal limits  POCT I-STAT TROPONIN I - Abnormal; Notable for the following:    Troponin i, poc 0.10 (*)     All other components within normal limits  PRO B NATRIURETIC PEPTIDE  CBC WITH DIFFERENTIAL  TROPONIN I   Dg Chest Port 1 View  06/20/2012  *RADIOLOGY REPORT*  Clinical Data: Shortness of breath  PORTABLE CHEST - 1 VIEW  Comparison: Chest x-ray of 02/06/2012  Findings: There is cardiomegaly present.  There is mild right basilar atelectasis noted.  Minimal pulmonary vascular congestion cannot be excluded.  There is some calcification of the left hemidiaphragm  most likely asbestos related.  Median sternotomy sutures are noted from prior CABG.  IMPRESSION:   Cardiomegaly.  Cannot exclude mild pulmonary vascular congestion.   Original Report Authenticated By: Dwyane Dee, M.D.      EKG:  Rhythm: normal sinus Rate: 60 Axis: normal Intervals:1st degree AV block. Abnormal R wave progression. ST segments: NS ST changes  1. Dyspnea   2. Acute kidney injury   3. Hypokalemia       MDM  89yM with dyspnea. Clinically not CHF. Lungs clear. CXR without significant edema. BNP normal. No edema noted on exam. Lowest o2 sat was 94% on RA. Unsure of significance of pt's reported weight fluctuations. Family reports that pt takes between 1-4 tabs of 20mg  torsemide depending on his weight and symptoms. He has been given more recently and this is most likely etiology of Cr elevation from baseline and hypokalemia. Family instructed to skip tomorrow and then resume at 1 tab/day and need to discuss with PCP or cards ASAP. Istat trop minimally elevated. Lab troponin I normal. EKG without ischemic changes.  Denies CP. No SOB at rest. No respiratory distress on exam. Doubt infectious or PE. Pt with known CAD s/p CABG and followed by Dr Tresa Endo. Do not feel that pt is in need of emergent medical admission but will require close outpt follow-up.         Raeford Razor, MD 06/20/12 1302

## 2012-06-25 ENCOUNTER — Other Ambulatory Visit (HOSPITAL_COMMUNITY): Payer: Self-pay | Admitting: Cardiovascular Disease

## 2012-06-25 DIAGNOSIS — R0602 Shortness of breath: Secondary | ICD-10-CM

## 2012-06-25 DIAGNOSIS — I2510 Atherosclerotic heart disease of native coronary artery without angina pectoris: Secondary | ICD-10-CM

## 2012-07-03 ENCOUNTER — Observation Stay (HOSPITAL_COMMUNITY)
Admission: EM | Admit: 2012-07-03 | Discharge: 2012-07-07 | Disposition: A | Discharge status: Home / Self Care | Payer: Medicare PPO | Source: Ambulatory Visit | Attending: Internal Medicine | Admitting: Internal Medicine

## 2012-07-03 ENCOUNTER — Emergency Department (HOSPITAL_COMMUNITY): Payer: Medicare PPO

## 2012-07-03 ENCOUNTER — Encounter (HOSPITAL_COMMUNITY): Payer: Self-pay | Admitting: Emergency Medicine

## 2012-07-03 ENCOUNTER — Telehealth: Payer: Self-pay | Admitting: Pulmonary Disease

## 2012-07-03 DIAGNOSIS — C61 Malignant neoplasm of prostate: Secondary | ICD-10-CM | POA: Insufficient documentation

## 2012-07-03 DIAGNOSIS — M1990 Unspecified osteoarthritis, unspecified site: Secondary | ICD-10-CM | POA: Insufficient documentation

## 2012-07-03 DIAGNOSIS — R609 Edema, unspecified: Secondary | ICD-10-CM | POA: Insufficient documentation

## 2012-07-03 DIAGNOSIS — R5383 Other fatigue: Secondary | ICD-10-CM | POA: Insufficient documentation

## 2012-07-03 DIAGNOSIS — R05 Cough: Secondary | ICD-10-CM | POA: Insufficient documentation

## 2012-07-03 DIAGNOSIS — R5381 Other malaise: Secondary | ICD-10-CM | POA: Insufficient documentation

## 2012-07-03 DIAGNOSIS — I872 Venous insufficiency (chronic) (peripheral): Secondary | ICD-10-CM

## 2012-07-03 DIAGNOSIS — F411 Generalized anxiety disorder: Secondary | ICD-10-CM | POA: Diagnosis present

## 2012-07-03 DIAGNOSIS — I129 Hypertensive chronic kidney disease with stage 1 through stage 4 chronic kidney disease, or unspecified chronic kidney disease: Secondary | ICD-10-CM | POA: Insufficient documentation

## 2012-07-03 DIAGNOSIS — R7989 Other specified abnormal findings of blood chemistry: Secondary | ICD-10-CM

## 2012-07-03 DIAGNOSIS — R635 Abnormal weight gain: Secondary | ICD-10-CM

## 2012-07-03 DIAGNOSIS — E785 Hyperlipidemia, unspecified: Secondary | ICD-10-CM | POA: Diagnosis present

## 2012-07-03 DIAGNOSIS — R509 Fever, unspecified: Secondary | ICD-10-CM | POA: Insufficient documentation

## 2012-07-03 DIAGNOSIS — I10 Essential (primary) hypertension: Secondary | ICD-10-CM | POA: Diagnosis present

## 2012-07-03 DIAGNOSIS — N179 Acute kidney failure, unspecified: Secondary | ICD-10-CM

## 2012-07-03 DIAGNOSIS — J069 Acute upper respiratory infection, unspecified: Secondary | ICD-10-CM

## 2012-07-03 DIAGNOSIS — R079 Chest pain, unspecified: Secondary | ICD-10-CM | POA: Insufficient documentation

## 2012-07-03 DIAGNOSIS — R630 Anorexia: Secondary | ICD-10-CM | POA: Insufficient documentation

## 2012-07-03 DIAGNOSIS — I2510 Atherosclerotic heart disease of native coronary artery without angina pectoris: Secondary | ICD-10-CM | POA: Insufficient documentation

## 2012-07-03 DIAGNOSIS — K589 Irritable bowel syndrome without diarrhea: Secondary | ICD-10-CM

## 2012-07-03 DIAGNOSIS — I739 Peripheral vascular disease, unspecified: Secondary | ICD-10-CM | POA: Insufficient documentation

## 2012-07-03 DIAGNOSIS — R531 Weakness: Secondary | ICD-10-CM

## 2012-07-03 DIAGNOSIS — H9190 Unspecified hearing loss, unspecified ear: Secondary | ICD-10-CM | POA: Insufficient documentation

## 2012-07-03 DIAGNOSIS — R10813 Right lower quadrant abdominal tenderness: Secondary | ICD-10-CM

## 2012-07-03 DIAGNOSIS — N183 Chronic kidney disease, stage 3 unspecified: Secondary | ICD-10-CM | POA: Insufficient documentation

## 2012-07-03 DIAGNOSIS — R059 Cough, unspecified: Secondary | ICD-10-CM | POA: Insufficient documentation

## 2012-07-03 DIAGNOSIS — N259 Disorder resulting from impaired renal tubular function, unspecified: Secondary | ICD-10-CM

## 2012-07-03 DIAGNOSIS — F0390 Unspecified dementia without behavioral disturbance: Secondary | ICD-10-CM | POA: Insufficient documentation

## 2012-07-03 DIAGNOSIS — R413 Other amnesia: Secondary | ICD-10-CM

## 2012-07-03 DIAGNOSIS — K219 Gastro-esophageal reflux disease without esophagitis: Secondary | ICD-10-CM | POA: Insufficient documentation

## 2012-07-03 DIAGNOSIS — R0602 Shortness of breath: Principal | ICD-10-CM | POA: Insufficient documentation

## 2012-07-03 HISTORY — DX: Unspecified dementia, unspecified severity, without behavioral disturbance, psychotic disturbance, mood disturbance, and anxiety: F03.90

## 2012-07-03 LAB — CBC WITH DIFFERENTIAL/PLATELET
Basophils Absolute: 0 10*3/uL (ref 0.0–0.1)
Basophils Relative: 0 % (ref 0–1)
Eosinophils Absolute: 0.1 10*3/uL (ref 0.0–0.7)
MCH: 33.1 pg (ref 26.0–34.0)
MCHC: 34.3 g/dL (ref 30.0–36.0)
Neutro Abs: 14.4 10*3/uL — ABNORMAL HIGH (ref 1.7–7.7)
Neutrophils Relative %: 85 % — ABNORMAL HIGH (ref 43–77)
Platelets: 197 10*3/uL (ref 150–400)

## 2012-07-03 LAB — PROTIME-INR
INR: 1.17 (ref 0.00–1.49)
Prothrombin Time: 14.7 seconds (ref 11.6–15.2)

## 2012-07-03 LAB — BASIC METABOLIC PANEL
Chloride: 101 mEq/L (ref 96–112)
GFR calc Af Amer: 45 mL/min — ABNORMAL LOW (ref >90)
GFR calc non Af Amer: 39 mL/min — ABNORMAL LOW (ref >90)
Potassium: 4 mEq/L (ref 3.5–5.1)
Sodium: 139 mEq/L (ref 135–145)

## 2012-07-03 LAB — URINALYSIS, ROUTINE W REFLEX MICROSCOPIC
Bilirubin Urine: NEGATIVE
Glucose, UA: NEGATIVE mg/dL
Hgb urine dipstick: NEGATIVE
Ketones, ur: NEGATIVE mg/dL
Protein, ur: NEGATIVE mg/dL
Urobilinogen, UA: 0.2 mg/dL (ref 0.0–1.0)

## 2012-07-03 LAB — PRO B NATRIURETIC PEPTIDE: Pro B Natriuretic peptide (BNP): 939.1 pg/mL — ABNORMAL HIGH (ref 0–450)

## 2012-07-03 LAB — HEPATIC FUNCTION PANEL
ALT: 14 U/L (ref 0–53)
AST: 25 U/L (ref 0–37)
Albumin: 3.7 g/dL (ref 3.5–5.2)
Alkaline Phosphatase: 50 U/L (ref 39–117)
Total Protein: 7.2 g/dL (ref 6.0–8.3)

## 2012-07-03 LAB — LACTIC ACID, PLASMA: Lactic Acid, Venous: 1.2 mmol/L (ref 0.5–2.2)

## 2012-07-03 MED ORDER — IOHEXOL 300 MG/ML  SOLN
80.0000 mL | Freq: Once | INTRAMUSCULAR | Status: AC | PRN
  Administered 2012-07-03: 80 mL via INTRAVENOUS

## 2012-07-03 MED ORDER — OSELTAMIVIR PHOSPHATE 75 MG PO CAPS
75.0000 mg | ORAL_CAPSULE | Freq: Once | ORAL | Status: DC
  Filled 2012-07-03: qty 1

## 2012-07-03 MED ORDER — IOHEXOL 300 MG/ML  SOLN: 20.0000 mL | INTRAMUSCULAR | Status: AC

## 2012-07-03 MED ORDER — ACETAMINOPHEN 325 MG PO TABS
650.0000 mg | ORAL_TABLET | Freq: Once | ORAL | Status: AC
  Administered 2012-07-03: 650 mg via ORAL
  Filled 2012-07-03: qty 2

## 2012-07-03 NOTE — Telephone Encounter (Signed)
Called spoke with patient's caregiver Billy Morgan who reported that:  on Monday 12.2.13 his weight = 191; gave pt a demedex 20mg  Tuesday 12.3.13 weight = 186; no demedex given d/t instructions to hold if weight is equal-to-or-less-than 186 Today 12.4.13 weight = 192; demedex 20mg  given.  Pt's abdomen is distended despite multiple times to the bathroom, to the point where she is unable to button pt's pants.  Increased SOB, even with talking.  Assistance w/ mobility where no assistance has been needed prior.  Billy Morgan also reported that pt is gray with a pale gray around his eyes.  Has had to take a nitroglycerin on Monday and Tuesday.  Shela Nevin that pt needs to be taken to the ER now.  Billy Morgan verbalized her understanding and will take pt to [facility] ED.  Will sign and forward to SN as FYI.

## 2012-07-03 NOTE — ED Provider Notes (Signed)
Patient placed in CDU by Doug Sou, M.D. pending labs.  Patient is here for febrile illness and has received IVF, antipyretics, labs and imaging.   Plan per previous provider is to evaluate labs and make a dispo decision.  Patient re-evaluated and is resting comfortably, VSS, with no new complaints or concerns at this time.  On exam: hemodynamically stable, NAD, heart w/ RRR, lungs CTAB, Chest & abd non-tender, no peripheral edema or calf tenderness.  BP 132/70  Pulse 78  Temp 99.6 F (37.6 C) (Rectal)  Resp 19  SpO2 96%   11:44 PM  Elevated BNP and measured weight gain is concerning for heart failure.  Pt Cardiologist is Dr Tresa Endo. Discussed with patient's granddaughter the current lab and imaging results as well as the care plan, her questions were answered.  She is amenable to the plan   [facility] will admit    Dierdre Forth, PA-C 07/03/12 2345  Mcdonald Reiling, PA-C 07/03/12 2356

## 2012-07-03 NOTE — ED Notes (Signed)
Flu panel ordered from Lab

## 2012-07-03 NOTE — ED Provider Notes (Signed)
History     CSN: [id number]  Arrival date & time 07/03/12  1747   First MD Initiated Contact with Patient 07/03/12 1927      Chief Complaint  Patient presents with  . Shortness of Breath  . Fatigue  . Fever    (Consider location/radiation/quality/duration/timing/severity/associated sxs/prior treatment) HPI Level V caveat history of dementia. History is given by caregiver and by daughter in law Patient with increased generalized weakness, cough and mild shortness of breath over the past 2 days. Patient 's weight has increased from 186-192 over the past 3-4 day. His abdomen appears distended according to his caregiver. Maximum temperature today was 100.7. No treatment prior to coming here. Caregiver reports diminished appetite today. Past Medical History  Diagnosis Date  . Unspecified hearing loss   . Unspecified essential hypertension   . Coronary atherosclerosis of unspecified type of vessel, native or graft   . Peripheral vascular disease, unspecified   . Unspecified venous (peripheral) insufficiency   . Other and unspecified hyperlipidemia   . Esophageal reflux   . Irritable bowel syndrome   . Malignant neoplasm of prostate   . Unspecified disorder resulting from impaired renal function   . Osteoarthrosis, unspecified whether generalized or localized, unspecified site   . Memory loss   . Anxiety state, unspecified   . CHF (congestive heart failure)     Past Surgical History  Procedure Date  . Inguinal hernia repair   . Appendectomy   . Coronary artery bypass graft   . Coronary angioplasty with stent placement     History reviewed. No pertinent family history.  History  Substance Use Topics  . Smoking status: Never Smoker   . Smokeless tobacco: Never Used  . Alcohol Use: No      Review of Systems  Unable to perform ROS: Dementia  Constitutional: Positive for fever, appetite change and unexpected weight change.  Respiratory: Positive for cough.    Neurological: Positive for weakness.    Allergies  Sulfamethoxazole  Home Medications   Current Outpatient Rx  Name  Route  Sig  Dispense  Refill  . ALPRAZOLAM 0.25 MG PO TABS      take 1/2 to 1 tablet by mouth three times a day if needed   90 tablet   1   . ASPIRIN 81 MG PO TABS   Oral   Take 81 mg by mouth daily.           Marland Kitchen CLOBETASOL PROPIONATE 0.05 % EX SOLN   Topical   Apply 1 application topically every other day. Apply to scalp as directed         . DICLOFENAC SODIUM 50 MG PO TBEC      take 1 tablet by mouth twice a day if needed for arthritis pain   180 tablet   4   . KETOCONAZOLE 2 % EX CREA      Apply as directed         . LOSARTAN POTASSIUM 25 MG PO TABS   Oral   Take 25 mg by mouth daily.         Marland Kitchen METOPROLOL SUCCINATE ER 50 MG PO TB24   Oral   Take 50 mg by mouth daily. Take with or immediately following a meal.         . ONE-DAILY MULTI VITAMINS PO TABS   Oral   Take 1 tablet by mouth daily.         Marland Kitchen NITROGLYCERIN 0.4 MG SL SUBL  Sublingual   Place 0.4 mg under the tongue every 5 (five) minutes as needed.           Marland Kitchen POLYETHYLENE GLYCOL 3350 PO PACK   Oral   Take 17 g by mouth daily as needed. For constipation         . QUETIAPINE FUMARATE 25 MG PO TABS   Oral   Take 25 mg by mouth at bedtime.         Marland Kitchen ROSUVASTATIN CALCIUM 20 MG PO TABS   Oral   Take 20 mg by mouth daily.           . TORSEMIDE 20 MG PO TABS   Oral   Take 60 mg by mouth daily.           BP 139/70  Pulse 84  Temp 99.6 F (37.6 C) (Rectal)  Resp 22  SpO2 97%  Physical Exam  Nursing note and vitals reviewed. Constitutional: He appears well-developed.       Elderly, chronically ill-appearing sleepy easily arousable  HENT:  Head: Normocephalic and atraumatic.  Eyes: Conjunctivae normal are normal. Pupils are equal, round, and reactive to light.  Neck: Neck supple. No tracheal deviation present. No thyromegaly present.   Cardiovascular: Normal rate and regular rhythm.   No murmur heard. Pulmonary/Chest: Effort normal and breath sounds normal.       Scant diffuse crackles no respiratory distress  Abdominal: Soft. Bowel sounds are normal. He exhibits no distension. There is no tenderness.  Genitourinary: Prostate normal and penis normal.  Musculoskeletal: Normal range of motion. He exhibits no edema and no tenderness.  Neurological: He is alert. Coordination normal.       Follow simple commands moves all extremities  Skin: Skin is warm and dry. No rash noted.  Psychiatric: He has a normal mood and affect.    ED Course  Procedures (including critical care time)  Labs Reviewed  CBC WITH DIFFERENTIAL - Abnormal; Notable for the following:    WBC 17.0 (*)     RBC 4.11 (*)     Neutrophils Relative 85 (*)     Neutro Abs 14.4 (*)     Lymphocytes Relative 7 (*)     Monocytes Absolute 1.3 (*)     All other components within normal limits  BASIC METABOLIC PANEL - Abnormal; Notable for the following:    Glucose, Bld 122 (*)     Creatinine, Ser 1.49 (*)     GFR calc non Af Amer 39 (*)     GFR calc Af Amer 45 (*)     All other components within normal limits  PRO B NATRIURETIC PEPTIDE - Abnormal; Notable for the following:    Pro B Natriuretic peptide (BNP) 939.1 (*)     All other components within normal limits  PROTIME-INR  POCT I-STAT TROPONIN I  URINALYSIS, ROUTINE W REFLEX MICROSCOPIC  INFLUENZA PANEL BY PCR  LACTIC ACID, PLASMA   Date: 07/03/2012  Rate: 85  Rhythm: normal sinus rhythm  QRS Axis: normal  Intervals: normal  ST/T Wave abnormalities: normal  Conduction Disutrbances:none  Narrative Interpretation:   Old EKG Reviewed: No significant change from 06/20/2012 interpreted by me  Dg Chest 2 View  07/03/2012  *RADIOLOGY REPORT*  Clinical Data: Shortness of breath, headache  CHEST - 2 VIEW  Comparison: 06/20/2012 and 02/06/2012  Findings: Cardiomediastinal silhouette is stable.  Stable  right basilar scarring.  Stable bilateral calcified pleural plaques.  No acute infiltrate or pulmonary edema.  Status post CABG.  Mild degenerative changes thoracic spine.  IMPRESSION: No active disease.  Stable right basilar scarring.  Stable calcified pleural plaques bilaterally.   Original Report Authenticated By: Natasha Mead, M.D.      No diagnosis found. Results for orders placed during the hospital encounter of 07/03/12  CBC WITH DIFFERENTIAL      Component Value Range   WBC 17.0 (*) 4.0 - 10.5 K/uL   RBC 4.11 (*) 4.22 - 5.81 MIL/uL   Hemoglobin 13.6  13.0 - 17.0 g/dL   HCT 57.8  46.9 - 62.9 %   MCV 96.4  78.0 - 100.0 fL   MCH 33.1  26.0 - 34.0 pg   MCHC 34.3  30.0 - 36.0 g/dL   RDW 52.8  41.3 - 24.4 %   Platelets 197  150 - 400 K/uL   Neutrophils Relative 85 (*) 43 - 77 %   Neutro Abs 14.4 (*) 1.7 - 7.7 K/uL   Lymphocytes Relative 7 (*) 12 - 46 %   Lymphs Abs 1.2  0.7 - 4.0 K/uL   Monocytes Relative 8  3 - 12 %   Monocytes Absolute 1.3 (*) 0.1 - 1.0 K/uL   Eosinophils Relative 1  0 - 5 %   Eosinophils Absolute 0.1  0.0 - 0.7 K/uL   Basophils Relative 0  0 - 1 %   Basophils Absolute 0.0  0.0 - 0.1 K/uL  BASIC METABOLIC PANEL      Component Value Range   Sodium 139  135 - 145 mEq/L   Potassium 4.0  3.5 - 5.1 mEq/L   Chloride 101  96 - 112 mEq/L   CO2 28  19 - 32 mEq/L   Glucose, Bld 122 (*) 70 - 99 mg/dL   BUN 22  6 - 23 mg/dL   Creatinine, Ser 0.10 (*) 0.50 - 1.35 mg/dL   Calcium 9.5  8.4 - 27.2 mg/dL   GFR calc non Af Amer 39 (*) >90 mL/min   GFR calc Af Amer 45 (*) >90 mL/min  PRO B NATRIURETIC PEPTIDE      Component Value Range   Pro B Natriuretic peptide (BNP) 939.1 (*) 0 - 450 pg/mL  PROTIME-INR      Component Value Range   Prothrombin Time 14.7  11.6 - 15.2 seconds   INR 1.17  0.00 - 1.49  POCT I-STAT TROPONIN I      Component Value Range   Troponin i, poc 0.03  0.00 - 0.08 ng/mL   Comment 3            Dg Chest 2 View  07/03/2012  *RADIOLOGY REPORT*   Clinical Data: Shortness of breath, headache  CHEST - 2 VIEW  Comparison: 06/20/2012 and 02/06/2012  Findings: Cardiomediastinal silhouette is stable.  Stable right basilar scarring.  Stable bilateral calcified pleural plaques.  No acute infiltrate or pulmonary edema.  Status post CABG.  Mild degenerative changes thoracic spine.  IMPRESSION: No active disease.  Stable right basilar scarring.  Stable calcified pleural plaques bilaterally.   Original Report Authenticated By: Natasha Mead, M.D.    Dg Chest Port 1 View  06/20/2012  *RADIOLOGY REPORT*  Clinical Data: Shortness of breath  PORTABLE CHEST - 1 VIEW  Comparison: Chest x-ray of 02/06/2012  Findings: There is cardiomegaly present.  There is mild right basilar atelectasis noted.  Minimal pulmonary vascular congestion cannot be excluded.  There is some calcification of the left hemidiaphragm most likely asbestos related.  Median sternotomy  sutures are noted from prior CABG.  IMPRESSION:   Cardiomegaly.  Cannot exclude mild pulmonary vascular congestion.   Original Report Authenticated By: Dwyane Dee, M.D.     Results for orders placed during the hospital encounter of 07/03/12  CBC WITH DIFFERENTIAL      Component Value Range   WBC 17.0 (*) 4.0 - 10.5 K/uL   RBC 4.11 (*) 4.22 - 5.81 MIL/uL   Hemoglobin 13.6  13.0 - 17.0 g/dL   HCT 16.1  09.6 - 04.5 %   MCV 96.4  78.0 - 100.0 fL   MCH 33.1  26.0 - 34.0 pg   MCHC 34.3  30.0 - 36.0 g/dL   RDW 40.9  81.1 - 91.4 %   Platelets 197  150 - 400 K/uL   Neutrophils Relative 85 (*) 43 - 77 %   Neutro Abs 14.4 (*) 1.7 - 7.7 K/uL   Lymphocytes Relative 7 (*) 12 - 46 %   Lymphs Abs 1.2  0.7 - 4.0 K/uL   Monocytes Relative 8  3 - 12 %   Monocytes Absolute 1.3 (*) 0.1 - 1.0 K/uL   Eosinophils Relative 1  0 - 5 %   Eosinophils Absolute 0.1  0.0 - 0.7 K/uL   Basophils Relative 0  0 - 1 %   Basophils Absolute 0.0  0.0 - 0.1 K/uL  BASIC METABOLIC PANEL      Component Value Range   Sodium 139  135 - 145  mEq/L   Potassium 4.0  3.5 - 5.1 mEq/L   Chloride 101  96 - 112 mEq/L   CO2 28  19 - 32 mEq/L   Glucose, Bld 122 (*) 70 - 99 mg/dL   BUN 22  6 - 23 mg/dL   Creatinine, Ser 7.82 (*) 0.50 - 1.35 mg/dL   Calcium 9.5  8.4 - 95.6 mg/dL   GFR calc non Af Amer 39 (*) >90 mL/min   GFR calc Af Amer 45 (*) >90 mL/min  PRO B NATRIURETIC PEPTIDE      Component Value Range   Pro B Natriuretic peptide (BNP) 939.1 (*) 0 - 450 pg/mL  PROTIME-INR      Component Value Range   Prothrombin Time 14.7  11.6 - 15.2 seconds   INR 1.17  0.00 - 1.49  POCT I-STAT TROPONIN I      Component Value Range   Troponin i, poc 0.03  0.00 - 0.08 ng/mL   Comment 3           URINALYSIS, ROUTINE W REFLEX MICROSCOPIC      Component Value Range   Color, Urine YELLOW  YELLOW   APPearance CLEAR  CLEAR   Specific Gravity, Urine 1.012  1.005 - 1.030   pH 5.0  5.0 - 8.0   Glucose, UA NEGATIVE  NEGATIVE mg/dL   Hgb urine dipstick NEGATIVE  NEGATIVE   Bilirubin Urine NEGATIVE  NEGATIVE   Ketones, ur NEGATIVE  NEGATIVE mg/dL   Protein, ur NEGATIVE  NEGATIVE mg/dL   Urobilinogen, UA 0.2  0.0 - 1.0 mg/dL   Nitrite NEGATIVE  NEGATIVE   Leukocytes, UA NEGATIVE  NEGATIVE  INFLUENZA PANEL BY PCR      Component Value Range   Influenza A By PCR NEGATIVE  NEGATIVE   Influenza B By PCR NEGATIVE  NEGATIVE   H1N1 flu by pcr NOT DETECTED  NOT DETECTED  LACTIC ACID, PLASMA      Component Value Range   Lactic Acid, Venous 1.2  0.5 - 2.2 mmol/L  HEPATIC FUNCTION PANEL      Component Value Range   Total Protein 7.2  6.0 - 8.3 g/dL   Albumin 3.7  3.5 - 5.2 g/dL   AST 25  0 - 37 U/L   ALT 14  0 - 53 U/L   Alkaline Phosphatase 50  39 - 117 U/L   Total Bilirubin 1.0  0.3 - 1.2 mg/dL   Bilirubin, Direct 0.2  0.0 - 0.3 mg/dL   Indirect Bilirubin 0.8  0.3 - 0.9 mg/dL   Dg Chest 2 View  16/07/958  *RADIOLOGY REPORT*  Clinical Data: Shortness of breath, headache  CHEST - 2 VIEW  Comparison: 06/20/2012 and 02/06/2012  Findings:  Cardiomediastinal silhouette is stable.  Stable right basilar scarring.  Stable bilateral calcified pleural plaques.  No acute infiltrate or pulmonary edema.  Status post CABG.  Mild degenerative changes thoracic spine.  IMPRESSION: No active disease.  Stable right basilar scarring.  Stable calcified pleural plaques bilaterally.   Original Report Authenticated By: Natasha Mead, M.D.    Ct Abdomen Pelvis W Contrast  07/03/2012  *RADIOLOGY REPORT*  Clinical Data: Abdominal pain and distention.  Fever.  Bloating. Recent weight gain. Prostate carcinoma.  CT ABDOMEN AND PELVIS WITH CONTRAST  Technique:  Multidetector CT imaging of the abdomen and pelvis was performed following the standard protocol during bolus administration of intravenous contrast.  Contrast: 80mL OMNIPAQUE IOHEXOL 300 MG/ML  SOLN  Comparison: 04/23/2011  Findings: Chronic right basilar pleural thickening and calcification again demonstrated.  The abdominal parenchymal organs are normal in appearance.  A tiny right renal cyst is stable. Gallbladder is unremarkable.  No evidence of hydronephrosis.  No soft tissue masses or lymphadenopathy identified within the abdomen or pelvis.  Brachytherapy seeds are seen throughout the prostate.  Colonic diverticulosis is noted, however there is no evidence of diverticulitis.  No other inflammatory process or abnormal fluid collections are identified.  No suspicious bone lesions are identified.  IMPRESSION:  1.  No acute findings. 2.  Diverticulosis.  No radiographic evidence of diverticulitis.   Original Report Authenticated By: Myles Rosenthal, M.D.    Dg Chest Port 1 View  06/20/2012  *RADIOLOGY REPORT*  Clinical Data: Shortness of breath  PORTABLE CHEST - 1 VIEW  Comparison: Chest x-ray of 02/06/2012  Findings: There is cardiomegaly present.  There is mild right basilar atelectasis noted.  Minimal pulmonary vascular congestion cannot be excluded.  There is some calcification of the left hemidiaphragm most likely  asbestos related.  Median sternotomy sutures are noted from prior CABG.  IMPRESSION:   Cardiomegaly.  Cannot exclude mild pulmonary vascular congestion.   Original Report Authenticated By: Dwyane Dee, M.D.      MDM  In light of leukocytosis and fever in this chronically ill person will pursue source of fever Diagnosis febrile illness        Doug Sou, MD 07/04/12 [id number]

## 2012-07-03 NOTE — ED Notes (Signed)
EKG performed 18:07 and placed in chart. Pt placed in gown

## 2012-07-03 NOTE — ED Notes (Signed)
Pt in CT.

## 2012-07-03 NOTE — ED Notes (Signed)
Pt c/o increased SOB and abd bloating; pt with 10lb weight gain x 4 days; pt with fever today and increased lethargy

## 2012-07-04 ENCOUNTER — Other Ambulatory Visit: Payer: Self-pay

## 2012-07-04 ENCOUNTER — Encounter (HOSPITAL_COMMUNITY): Payer: Self-pay | Admitting: Internal Medicine

## 2012-07-04 ENCOUNTER — Observation Stay (HOSPITAL_COMMUNITY): Payer: Medicare PPO

## 2012-07-04 DIAGNOSIS — I2510 Atherosclerotic heart disease of native coronary artery without angina pectoris: Secondary | ICD-10-CM

## 2012-07-04 DIAGNOSIS — R509 Fever, unspecified: Secondary | ICD-10-CM

## 2012-07-04 DIAGNOSIS — R609 Edema, unspecified: Secondary | ICD-10-CM

## 2012-07-04 DIAGNOSIS — R5381 Other malaise: Secondary | ICD-10-CM

## 2012-07-04 DIAGNOSIS — N179 Acute kidney failure, unspecified: Secondary | ICD-10-CM

## 2012-07-04 LAB — TROPONIN I
Troponin I: <0.3 ng/mL (ref <0.30)
Troponin I: <0.3 ng/mL (ref <0.30)

## 2012-07-04 LAB — PHOSPHORUS: Phosphorus: 2.9 mg/dL (ref 2.3–4.6)

## 2012-07-04 LAB — CBC
HCT: 35.7 % — ABNORMAL LOW (ref 39.0–52.0)
Hemoglobin: 12.1 g/dL — ABNORMAL LOW (ref 13.0–17.0)
MCV: 94.9 fL (ref 78.0–100.0)
RBC: 3.76 MIL/uL — ABNORMAL LOW (ref 4.22–5.81)
RDW: 12.7 % (ref 11.5–15.5)
WBC: 13.5 10*3/uL — ABNORMAL HIGH (ref 4.0–10.5)

## 2012-07-04 LAB — MAGNESIUM: Magnesium: 1.7 mg/dL (ref 1.5–2.5)

## 2012-07-04 LAB — COMPREHENSIVE METABOLIC PANEL
Albumin: 3.1 g/dL — ABNORMAL LOW (ref 3.5–5.2)
Alkaline Phosphatase: 44 U/L (ref 39–117)
BUN: 21 mg/dL (ref 6–23)
Calcium: 9.1 mg/dL (ref 8.4–10.5)
Creatinine, Ser: 1.41 mg/dL — ABNORMAL HIGH (ref 0.50–1.35)
GFR calc Af Amer: 48 mL/min — ABNORMAL LOW (ref >90)
Glucose, Bld: 96 mg/dL (ref 70–99)
Potassium: 3.7 mEq/L (ref 3.5–5.1)
Total Protein: 6.4 g/dL (ref 6.0–8.3)

## 2012-07-04 MED ORDER — SODIUM CHLORIDE 0.9 % IV SOLN: 250.0000 mL | INTRAVENOUS | Status: DC | PRN

## 2012-07-04 MED ORDER — ALPRAZOLAM 0.25 MG PO TABS: 0.2500 mg | ORAL_TABLET | Freq: Every evening | ORAL | Status: DC | PRN

## 2012-07-04 MED ORDER — ATORVASTATIN CALCIUM 40 MG PO TABS
40.0000 mg | ORAL_TABLET | Freq: Every day | ORAL | Status: DC
  Administered 2012-07-04 – 2012-07-06 (×3): 40 mg via ORAL
  Filled 2012-07-04 (×4): qty 1

## 2012-07-04 MED ORDER — NITROGLYCERIN 0.4 MG SL SUBL: 0.4000 mg | SUBLINGUAL_TABLET | SUBLINGUAL | Status: DC | PRN

## 2012-07-04 MED ORDER — HYDROCODONE-ACETAMINOPHEN 5-325 MG PO TABS
1.0000 | ORAL_TABLET | ORAL | Status: DC | PRN
  Administered 2012-07-04 – 2012-07-07 (×5): 2 via ORAL
  Filled 2012-07-04 (×5): qty 2

## 2012-07-04 MED ORDER — FUROSEMIDE 10 MG/ML IJ SOLN
40.0000 mg | Freq: Once | INTRAMUSCULAR | Status: AC
  Administered 2012-07-04: 40 mg via INTRAVENOUS
  Filled 2012-07-04: qty 4

## 2012-07-04 MED ORDER — SODIUM CHLORIDE 0.9 % IJ SOLN: 3.0000 mL | INTRAMUSCULAR | Status: DC | PRN

## 2012-07-04 MED ORDER — ISOSORBIDE MONONITRATE ER 30 MG PO TB24
30.0000 mg | ORAL_TABLET | Freq: Every day | ORAL | Status: DC
  Administered 2012-07-04 – 2012-07-07 (×4): 30 mg via ORAL
  Filled 2012-07-04 (×4): qty 1

## 2012-07-04 MED ORDER — ASPIRIN EC 81 MG PO TBEC
81.0000 mg | DELAYED_RELEASE_TABLET | Freq: Every day | ORAL | Status: DC
  Administered 2012-07-04 – 2012-07-07 (×4): 81 mg via ORAL
  Filled 2012-07-04 (×4): qty 1

## 2012-07-04 MED ORDER — ONDANSETRON HCL 4 MG PO TABS: 4.0000 mg | ORAL_TABLET | Freq: Four times a day (QID) | ORAL | Status: DC | PRN

## 2012-07-04 MED ORDER — DOCUSATE SODIUM 100 MG PO CAPS
100.0000 mg | ORAL_CAPSULE | Freq: Two times a day (BID) | ORAL | Status: DC
  Administered 2012-07-04 – 2012-07-07 (×7): 100 mg via ORAL
  Filled 2012-07-04 (×8): qty 1

## 2012-07-04 MED ORDER — ENOXAPARIN SODIUM 30 MG/0.3ML ~~LOC~~ SOLN
30.0000 mg | Freq: Every day | SUBCUTANEOUS | Status: DC
  Administered 2012-07-04 – 2012-07-07 (×4): 30 mg via SUBCUTANEOUS
  Filled 2012-07-04 (×4): qty 0.3

## 2012-07-04 MED ORDER — SODIUM CHLORIDE 0.9 % IJ SOLN
3.0000 mL | Freq: Two times a day (BID) | INTRAMUSCULAR | Status: DC
  Administered 2012-07-06: 12:00:00 via INTRAVENOUS
  Administered 2012-07-07: 3 mL via INTRAVENOUS

## 2012-07-04 MED ORDER — ASPIRIN EC 81 MG PO TBEC
81.0000 mg | DELAYED_RELEASE_TABLET | Freq: Every day | ORAL | Status: DC
  Filled 2012-07-04 (×4): qty 1

## 2012-07-04 MED ORDER — METOPROLOL SUCCINATE ER 50 MG PO TB24
50.0000 mg | ORAL_TABLET | Freq: Every day | ORAL | Status: DC
  Administered 2012-07-04 – 2012-07-06 (×3): 50 mg via ORAL
  Filled 2012-07-04 (×4): qty 1

## 2012-07-04 MED ORDER — ACETAMINOPHEN 650 MG RE SUPP: 650.0000 mg | Freq: Four times a day (QID) | RECTAL | Status: DC | PRN

## 2012-07-04 MED ORDER — ACETAMINOPHEN 325 MG PO TABS
650.0000 mg | ORAL_TABLET | Freq: Four times a day (QID) | ORAL | Status: DC | PRN
  Administered 2012-07-05: 650 mg via ORAL
  Filled 2012-07-04: qty 2

## 2012-07-04 MED ORDER — ONDANSETRON HCL 4 MG/2ML IJ SOLN: 4.0000 mg | Freq: Four times a day (QID) | INTRAMUSCULAR | Status: DC | PRN

## 2012-07-04 MED ORDER — SODIUM CHLORIDE 0.9 % IJ SOLN
3.0000 mL | Freq: Two times a day (BID) | INTRAMUSCULAR | Status: DC
  Administered 2012-07-04 – 2012-07-07 (×3): 3 mL via INTRAVENOUS

## 2012-07-04 NOTE — Plan of Care (Signed)
Problem: Phase I Progression Outcomes Goal: OOB as tolerated unless otherwise ordered Outcome: Not Applicable Date Met:  07/04/12 BR with BRP

## 2012-07-04 NOTE — ED Provider Notes (Signed)
Medical screening examination/treatment/procedure(s) were conducted as a shared visit with non-physician practitioner(s) and myself.  I personally evaluated the patient during the encounter  Doug Sou, MD 07/04/12 [phone]

## 2012-07-04 NOTE — Consult Note (Signed)
Reason for Consult: Dyspnea  Requesting Physician: Claybon Jabs  HPI: This is a 78 y.o. male followed by Dr Kriste Basque and Dr Tresa Endo with a past medical history significant for CAD. He had CABG X 5 in 1997. Myoview in Feb 2012 was low risk. Echo in March 2012 showed diastolic dysfunction. He lives alone and has a caretaker. He was recently in [facility] ER 06/20/12 with SOB. His SCr had gone from 1.8 in July to 2.75 when he was seen in the ER at [facility]. Dr Tresa Endo saw him in th office 06/25/12. His diuretics were cut back and his ARB was stopped as well as his Diclofenac. He was to have an OP echo and Myoview. He is admitted now with cough and SOB. His BNP was 939, Scr 1.4. CXR did not show any CHF. He was febrile (T 100) with a WBCof 17K on admission. His flu test are negative. His daughter says he was taking some NTG the day before admission for chest tightness.  PMHx:  Past Medical History  Diagnosis Date  . Unspecified hearing loss   . Unspecified essential hypertension   . Coronary atherosclerosis of unspecified type of vessel, native or graft   . Peripheral vascular disease, unspecified   . Unspecified venous (peripheral) insufficiency   . Other and unspecified hyperlipidemia   . Esophageal reflux   . Irritable bowel syndrome   . Unspecified disorder resulting from impaired renal function   . Osteoarthrosis, unspecified whether generalized or localized, unspecified site   . Memory loss   . Anxiety state, unspecified   . CHF (congestive heart failure)   . Malignant neoplasm of prostate   . Dementia    Past Surgical History  Procedure Date  . Inguinal hernia repair   . Appendectomy   . Coronary artery bypass graft   . Coronary angioplasty with stent placement     FAMHx: Family History  Problem Relation Age of Onset  . Hypertension Mother   . Heart disease Father     SOCHx:  reports that he has never smoked. He has never used smokeless tobacco. He reports that he does not drink alcohol or  use illicit drugs.  ALLERGIES: Allergies  Allergen Reactions  . Sulfamethoxazole     REACTION: unspecified    ROS: Pertinent items are noted in HPI.  HOME MEDICATIONS: Prescriptions prior to admission  Medication Sig Dispense Refill  . ALPRAZolam (XANAX) 0.25 MG tablet Take 0.25 mg by mouth at bedtime as needed. Takes for nightmares      . aspirin 81 MG tablet Take 81 mg by mouth daily.        Marland Kitchen ketoconazole (NIZORAL) 2 % cream Apply 1 application topically daily. Apply as directed      . metolazone (ZAROXOLYN) 5 MG tablet Take 5 mg by mouth daily as needed. For when blood pressure and weight is increased      . metoprolol succinate (TOPROL-XL) 50 MG 24 hr tablet Take 50 mg by mouth at bedtime.       . Multiple Vitamin (MULTIVITAMIN) tablet Take 1 tablet by mouth daily.      . nitroGLYCERIN (NITROSTAT) 0.4 MG SL tablet Place 0.4 mg under the tongue every 5 (five) minutes as needed. For chest pain      . rosuvastatin (CRESTOR) 20 MG tablet Take 20 mg by mouth daily.        Marland Kitchen torsemide (DEMADEX) 20 MG tablet Take 20 mg by mouth daily as needed. Take if weight greater  than 186 lbs.        HOSPITAL MEDICATIONS: I have reviewed the patient's current medications.    . [COMPLETED] acetaminophen  650 mg Oral Once  . aspirin EC  81 mg Oral Daily  . aspirin EC  81 mg Oral Daily  . atorvastatin  40 mg Oral q1800  . docusate sodium  100 mg Oral BID  . enoxaparin (LOVENOX) injection  30 mg Subcutaneous Daily  . [EXPIRED] iohexol  20 mL Oral Q1 Hr x 2  . metoprolol succinate  50 mg Oral QHS  . sodium chloride  3 mL Intravenous Q12H  . sodium chloride  3 mL Intravenous Q12H  . [DISCONTINUED] oseltamivir  75 mg Oral Once   VITALS: Blood pressure 136/76, pulse 84, temperature 98.5 F (36.9 C), temperature source Oral, resp. rate 24, height 6' (1.829 m), weight 81.3 kg (179 lb 3.7 oz), SpO2 95.00%.  PHYSICAL EXAM: General appearance: alert, cooperative, no distress and hard of  hearing Neck: no carotid bruit and no JVD Lungs: fine crackles bilat Heart: regular rate and rhythm Abdomen: protuberant, tympanic Extremities: no edema Pulses: 2+ and symmetric Skin: cool and dry Neurologic: Grossly normal  LABS: Results for orders placed during the hospital encounter of 07/03/12 (from the past 48 hour(s))  CBC WITH DIFFERENTIAL     Status: Abnormal   Collection Time   07/03/12  7:01 PM      Component Value Range Comment   WBC 17.0 (*) 4.0 - 10.5 K/uL    RBC 4.11 (*) 4.22 - 5.81 MIL/uL    Hemoglobin 13.6  13.0 - 17.0 g/dL    HCT 38.7  56.4 - 33.2 %    MCV 96.4  78.0 - 100.0 fL    MCH 33.1  26.0 - 34.0 pg    MCHC 34.3  30.0 - 36.0 g/dL    RDW 95.1  88.4 - 16.6 %    Platelets 197  150 - 400 K/uL    Neutrophils Relative 85 (*) 43 - 77 %    Neutro Abs 14.4 (*) 1.7 - 7.7 K/uL    Lymphocytes Relative 7 (*) 12 - 46 %    Lymphs Abs 1.2  0.7 - 4.0 K/uL    Monocytes Relative 8  3 - 12 %    Monocytes Absolute 1.3 (*) 0.1 - 1.0 K/uL    Eosinophils Relative 1  0 - 5 %    Eosinophils Absolute 0.1  0.0 - 0.7 K/uL    Basophils Relative 0  0 - 1 %    Basophils Absolute 0.0  0.0 - 0.1 K/uL   BASIC METABOLIC PANEL     Status: Abnormal   Collection Time   07/03/12  7:01 PM      Component Value Range Comment   Sodium 139  135 - 145 mEq/L    Potassium 4.0  3.5 - 5.1 mEq/L    Chloride 101  96 - 112 mEq/L    CO2 28  19 - 32 mEq/L    Glucose, Bld 122 (*) 70 - 99 mg/dL    BUN 22  6 - 23 mg/dL    Creatinine, Ser 0.63 (*) 0.50 - 1.35 mg/dL    Calcium 9.5  8.4 - 01.6 mg/dL    GFR calc non Af Amer 39 (*) >90 mL/min    GFR calc Af Amer 45 (*) >90 mL/min   PRO B NATRIURETIC PEPTIDE     Status: Abnormal   Collection Time   07/03/12  7:01 PM      Component Value Range Comment   Pro B Natriuretic peptide (BNP) 939.1 (*) 0 - 450 pg/mL   PROTIME-INR     Status: Normal   Collection Time   07/03/12  7:01 PM      Component Value Range Comment   Prothrombin Time 14.7  11.6 - 15.2  seconds    INR 1.17  0.00 - 1.49   POCT I-STAT TROPONIN I     Status: Normal   Collection Time   07/03/12  7:07 PM      Component Value Range Comment   Troponin i, poc 0.03  0.00 - 0.08 ng/mL    Comment 3            URINALYSIS, ROUTINE W REFLEX MICROSCOPIC     Status: Normal   Collection Time   07/03/12  7:51 PM      Component Value Range Comment   Color, Urine YELLOW  YELLOW    APPearance CLEAR  CLEAR    Specific Gravity, Urine 1.012  1.005 - 1.030    pH 5.0  5.0 - 8.0    Glucose, UA NEGATIVE  NEGATIVE mg/dL    Hgb urine dipstick NEGATIVE  NEGATIVE    Bilirubin Urine NEGATIVE  NEGATIVE    Ketones, ur NEGATIVE  NEGATIVE mg/dL    Protein, ur NEGATIVE  NEGATIVE mg/dL    Urobilinogen, UA 0.2  0.0 - 1.0 mg/dL    Nitrite NEGATIVE  NEGATIVE    Leukocytes, UA NEGATIVE  NEGATIVE MICROSCOPIC NOT DONE ON URINES WITH NEGATIVE PROTEIN, BLOOD, LEUKOCYTES, NITRITE, OR GLUCOSE <1000 mg/dL.  LACTIC ACID, PLASMA     Status: Normal   Collection Time   07/03/12  8:07 PM      Component Value Range Comment   Lactic Acid, Venous 1.2  0.5 - 2.2 mmol/L   HEPATIC FUNCTION PANEL     Status: Normal   Collection Time   07/03/12  8:15 PM      Component Value Range Comment   Total Protein 7.2  6.0 - 8.3 g/dL    Albumin 3.7  3.5 - 5.2 g/dL    AST 25  0 - 37 U/L    ALT 14  0 - 53 U/L    Alkaline Phosphatase 50  39 - 117 U/L    Total Bilirubin 1.0  0.3 - 1.2 mg/dL    Bilirubin, Direct 0.2  0.0 - 0.3 mg/dL    Indirect Bilirubin 0.8  0.3 - 0.9 mg/dL   INFLUENZA PANEL BY PCR     Status: Normal   Collection Time   07/03/12  9:41 PM      Component Value Range Comment   Influenza A By PCR NEGATIVE  NEGATIVE    Influenza B By PCR NEGATIVE  NEGATIVE    H1N1 flu by pcr NOT DETECTED  NOT DETECTED   MAGNESIUM     Status: Normal   Collection Time   07/04/12  5:45 AM      Component Value Range Comment   Magnesium 1.7  1.5 - 2.5 mg/dL   PHOSPHORUS     Status: Normal   Collection Time   07/04/12  5:45 AM       Component Value Range Comment   Phosphorus 2.9  2.3 - 4.6 mg/dL   TSH     Status: Normal   Collection Time   07/04/12  5:45 AM      Component Value Range Comment  TSH 0.920  0.350 - 4.500 uIU/mL   COMPREHENSIVE METABOLIC PANEL     Status: Abnormal   Collection Time   07/04/12  5:45 AM      Component Value Range Comment   Sodium 138  135 - 145 mEq/L    Potassium 3.7  3.5 - 5.1 mEq/L    Chloride 100  96 - 112 mEq/L    CO2 26  19 - 32 mEq/L    Glucose, Bld 96  70 - 99 mg/dL    BUN 21  6 - 23 mg/dL    Creatinine, Ser 0.45 (*) 0.50 - 1.35 mg/dL    Calcium 9.1  8.4 - 40.9 mg/dL    Total Protein 6.4  6.0 - 8.3 g/dL    Albumin 3.1 (*) 3.5 - 5.2 g/dL    AST 23  0 - 37 U/L    ALT 12  0 - 53 U/L    Alkaline Phosphatase 44  39 - 117 U/L    Total Bilirubin 1.1  0.3 - 1.2 mg/dL    GFR calc non Af Amer 42 (*) >90 mL/min    GFR calc Af Amer 48 (*) >90 mL/min   CBC     Status: Abnormal   Collection Time   07/04/12  5:45 AM      Component Value Range Comment   WBC 13.5 (*) 4.0 - 10.5 K/uL    RBC 3.76 (*) 4.22 - 5.81 MIL/uL    Hemoglobin 12.1 (*) 13.0 - 17.0 g/dL    HCT 81.1 (*) 91.4 - 52.0 %    MCV 94.9  78.0 - 100.0 fL    MCH 32.2  26.0 - 34.0 pg    MCHC 33.9  30.0 - 36.0 g/dL    RDW 78.2  95.6 - 21.3 %    Platelets 176  150 - 400 K/uL   TROPONIN I     Status: Normal   Collection Time   07/04/12  5:45 AM      Component Value Range Comment   Troponin I <0.30  <0.30 ng/mL   TROPONIN I     Status: Normal   Collection Time   07/04/12 11:30 AM      Component Value Range Comment   Troponin I <0.30  <0.30 ng/mL     IMAGING: Dg Chest 2 View  07/03/2012  *RADIOLOGY REPORT*  Clinical Data: Shortness of breath, headache  CHEST - 2 VIEW  Comparison: 06/20/2012 and 02/06/2012  Findings: Cardiomediastinal silhouette is stable.  Stable right basilar scarring.  Stable bilateral calcified pleural plaques.  No acute infiltrate or pulmonary edema.  Status post CABG.  Mild degenerative changes thoracic  spine.  IMPRESSION: No active disease.  Stable right basilar scarring.  Stable calcified pleural plaques bilaterally.   Original Report Authenticated By: Natasha Mead, M.D.    Ct Abdomen Pelvis W Contrast  07/03/2012  *RADIOLOGY REPORT*  Clinical Data: Abdominal pain and distention.  Fever.  Bloating. Recent weight gain. Prostate carcinoma.  CT ABDOMEN AND PELVIS WITH CONTRAST  Technique:  Multidetector CT   IMPRESSION:  1.  No acute findings. 2.  Diverticulosis.  No radiographic evidence of diverticulitis.   Original Report Authenticated By: Myles Rosenthal, M.D.    EKG- NSR without acute changes    IMPRESSION: Principal Problem:  *SOB (shortness of breath)-? CHF, ? Infectious, ? Anginal equivalent ? PE- (supprisingly Wells criteria low in this 78y/o chronically ill pt with a history of prostate cancer)  Active Problems:  HYPERTENSION  Leukocytosis  Fever  Cough  Shortness of breath  CAD, CABG X 5 '97. Low risk Myoview 2/12  Chronic renal disease, stage III, his SCr was up to 2.75 in November 2013 and diuretics held  PROSTATE CANCER  HYPERLIPIDEMIA  ANXIETY  HEARING LOSS  Edema   RECOMMENDATION: Dr Tresa Endo to see. Echo ordered.  Time Spent Directly with Patient: 40 minutes  KILROY,LUKE K 07/04/2012, 1:26 PM    Patient seen and examined. Agree with assessment and plan. Pt is well known to me. He was recently seen in office last week as noted above. At that time his ARB and NSAID were discontinued, and Cr has improved from 2.75 to 1.4. He has developed increased cough, bronchitic like symptoms and presented last evening with fever and sweats. Cardiac enzymes negative. WBC increased to 17 k; BNP 939. He apparently had also taken some NTG for chest tightness.  PE is notable for bilateral rales. Will give Laix 40 mg iv x1, and add low dose nitrates.  Echo to be done to reassess LV systolic and diastolic function. Pt is 16 1/2 yrs s/p CABG.   Lennette Bihari, MD, [facility] 07/04/2012 1:52 PM

## 2012-07-04 NOTE — ED Notes (Signed)
Ambulatory to bathroom with caregiver without difficulty.

## 2012-07-04 NOTE — H&P (Signed)
PCP:   Michele Mcalpine, MD  Cardiology: Tresa Endo   Chief Complaint:   Shortness of breath and generalized weakness  HPI: Billy Morgan is a 78 y.o. male   has a past medical history of Unspecified hearing loss; Unspecified essential hypertension; Coronary atherosclerosis of unspecified type of vessel, native or graft; Peripheral vascular disease, unspecified; Unspecified venous (peripheral) insufficiency; Other and unspecified hyperlipidemia; Esophageal reflux; Irritable bowel syndrome; Unspecified disorder resulting from impaired renal function; Osteoarthrosis, unspecified whether generalized or localized, unspecified site; Memory loss; Anxiety state, unspecified; CHF (congestive heart failure); Malignant neoplasm of prostate; and Dementia.   Presented with  For the past few days he have had increased weakness, has trouble getting out his chair. Worsening abdominal distention. He was noted to be febrile while in ED. The shortness of breath has been there for the past 3 wks and have been gradually getting worse. His cough is dry. He has had some increase in weight from 184 -> 192 lb. They tired using torsemide but no significant improvement. No leg swelling. He have had some intermittent chest pain treated with nitro and aspirin for the past few days.     Review of Systems:     Pertinent positives include: Fevers, chills, fatigue, weight gain. chest pain, shortness of breath at rest.   Constitutional:  No weight loss, night sweats,weight loss HEENT:  No headaches, Difficulty swallowing,Tooth/dental problems,Sore throat,  No sneezing, itching, ear ache, nasal congestion, post nasal drip,  Cardio-vascular:  No  Orthopnea, PND, anasarca, dizziness, palpitations.no Bilateral lower extremity swelling  GI:  No heartburn, indigestion, abdominal pain, nausea, vomiting, diarrhea, change in bowel habits, loss of appetite, melena, blood in stool, hematemesis Resp:  no No dyspnea on exertion, No  excess mucus, no productive cough, No non-productive cough, No coughing up of blood.No change in color of mucus.No wheezing. Skin:  no rash or lesions. No jaundice GU:  no dysuria, change in color of urine, no urgency or frequency. No straining to urinate.  No flank pain.  Musculoskeletal:  No joint pain or no joint swelling. No decreased range of motion. No back pain.  Psych:  No change in mood or affect. No depression or anxiety. No memory loss.  Neuro: no localizing neurological complaints, no tingling, no weakness, no double vision, no gait abnormality, no slurred speech, no confusion  Otherwise ROS are negative except for above, 10 systems were reviewed  Past Medical History: Past Medical History  Diagnosis Date  . Unspecified hearing loss   . Unspecified essential hypertension   . Coronary atherosclerosis of unspecified type of vessel, native or graft   . Peripheral vascular disease, unspecified   . Unspecified venous (peripheral) insufficiency   . Other and unspecified hyperlipidemia   . Esophageal reflux   . Irritable bowel syndrome   . Unspecified disorder resulting from impaired renal function   . Osteoarthrosis, unspecified whether generalized or localized, unspecified site   . Memory loss   . Anxiety state, unspecified   . CHF (congestive heart failure)   . Malignant neoplasm of prostate   . Dementia    Past Surgical History  Procedure Date  . Inguinal hernia repair   . Appendectomy   . Coronary artery bypass graft   . Coronary angioplasty with stent placement      Medications: Prior to Admission medications   Medication Sig Start Date End Date Taking? Authorizing Provider  ALPRAZolam (XANAX) 0.25 MG tablet Take 0.25 mg by mouth at bedtime as needed. Takes for  nightmares   Yes Historical Provider, MD  aspirin 81 MG tablet Take 81 mg by mouth daily.     Yes Historical Provider, MD  ketoconazole (NIZORAL) 2 % cream Apply 1 application topically daily. Apply as  directed 11/22/11  Yes Historical Provider, MD  metolazone (ZAROXOLYN) 5 MG tablet Take 5 mg by mouth daily as needed. For when blood pressure and weight is increased   Yes Historical Provider, MD  metoprolol succinate (TOPROL-XL) 50 MG 24 hr tablet Take 50 mg by mouth at bedtime.    Yes Historical Provider, MD  Multiple Vitamin (MULTIVITAMIN) tablet Take 1 tablet by mouth daily.   Yes Historical Provider, MD  nitroGLYCERIN (NITROSTAT) 0.4 MG SL tablet Place 0.4 mg under the tongue every 5 (five) minutes as needed. For chest pain   Yes Historical Provider, MD  rosuvastatin (CRESTOR) 20 MG tablet Take 20 mg by mouth daily.     Yes Historical Provider, MD  torsemide (DEMADEX) 20 MG tablet Take 20 mg by mouth daily as needed. Take if weight greater than 186 lbs.   Yes Historical Provider, MD    Allergies:   Allergies  Allergen Reactions  . Sulfamethoxazole     REACTION: unspecified    Social History:  Ambulatory with  cane Lives at   Home with caregiver  Son is MPOA [phone]   reports that he has never smoked. He has never used smokeless tobacco. He reports that he does not drink alcohol or use illicit drugs.   Family History: family history includes Heart disease in his father and Hypertension in his mother.    Physical Exam: Patient Vitals for the past 24 hrs:  BP Temp Temp src Pulse Resp SpO2  07/04/12 0123 - 98.3 F (36.8 C) Oral - - -  07/04/12 0100 133/68 mmHg - - 76  22  95 %  07/04/12 0030 141/78 mmHg - - 79  18  96 %  07/03/12 2330 125/62 mmHg - - 80  24  94 %  07/03/12 2300 132/66 mmHg - - 76  21  95 %  07/03/12 2200 130/69 mmHg - - 77  - 96 %  07/03/12 2145 132/70 mmHg - - 78  - 96 %  07/03/12 2135 145/71 mmHg - - 79  - 95 %  07/03/12 2015 142/62 mmHg - - 78  19  98 %  07/03/12 1949 - 99.6 F (37.6 C) Rectal - - -  07/03/12 1918 139/70 mmHg 98.8 F (37.1 C) Oral 84  22  97 %  07/03/12 1800 131/85 mmHg 100.2 F (37.9 C) Oral 95  - 96 %    1. General:  in  No Acute distress 2. Psychological: Alert and  Oriented to situation but not time 3. Head/ENT:   Moist   Mucous Membranes                          Head Non traumatic, neck supple                          Normal   Dentition 4. SKIN:  decreased Skin turgor,  Skin clean Dry and intact no rash 5. Heart: Regular rate and rhythm no Murmur, Rub or gallop 6. Lungs:somewhat distant but no wheezes or crackles   7. Abdomen: Soft, non-tender, slightly distended 8. Lower extremities: no clubbing, cyanosis, or edema 9. Neurologically Grossly intact, moving all 4 extremities equally 10.  MSK: Normal range of motion  body mass index is unknown because there is no height or weight on file.   Labs on Admission:   [facility] 07/03/12 1901  NA 139  K 4.0  CL 101  CO2 28  GLUCOSE 122*  BUN 22  CREATININE 1.49*  CALCIUM 9.5  MG --  PHOS --    Basename 07/03/12 2015  AST 25  ALT 14  ALKPHOS 50  BILITOT 1.0  PROT 7.2  ALBUMIN 3.7   No results found for this basename: LIPASE:2,AMYLASE:2 in the last 72 hours  Basename 07/03/12 1901  WBC 17.0*  NEUTROABS 14.4*  HGB 13.6  HCT 39.6  MCV 96.4  PLT 197   No results found for this basename: CKTOTAL:3,CKMB:3,CKMBINDEX:3,TROPONINI:3 in the last 72 hours No results found for this basename: TSH,T4TOTAL,FREET3,T3FREE,THYROIDAB in the last 72 hours No results found for this basename: VITAMINB12:2,FOLATE:2,FERRITIN:2,TIBC:2,IRON:2,RETICCTPCT:2 in the last 72 hours No results found for this basename: HGBA1C    The CrCl is unknown because both a height and weight (above a minimum accepted value) are required for this calculation. ABG No results found for this basename: phart, pco2, po2, hco3, tco2, acidbasedef, o2sat     No results found for this basename: DDIMER     Other results:  I have pearsonaly reviewed this: ECG REPORT  Rate:87   Rhythm: NSR ST&T Change: no ischemic changes  UA no evidence of infection BNP 939 which is up from  baseline.   Cultures: No results found for this basename: sdes, specrequest, cult, reptstatus       Radiological Exams on Admission: Dg Chest 2 View  07/03/2012  *RADIOLOGY REPORT*  Clinical Data: Shortness of breath, headache  CHEST - 2 VIEW  Comparison: 06/20/2012 and 02/06/2012  Findings: Cardiomediastinal silhouette is stable.  Stable right basilar scarring.  Stable bilateral calcified pleural plaques.  No acute infiltrate or pulmonary edema.  Status post CABG.  Mild degenerative changes thoracic spine.  IMPRESSION: No active disease.  Stable right basilar scarring.  Stable calcified pleural plaques bilaterally.   Original Report Authenticated By: Natasha Mead, M.D.    Ct Abdomen Pelvis W Contrast  07/03/2012  *RADIOLOGY REPORT*  Clinical Data: Abdominal pain and distention.  Fever.  Bloating. Recent weight gain. Prostate carcinoma.  CT ABDOMEN AND PELVIS WITH CONTRAST  Technique:  Multidetector CT imaging of the abdomen and pelvis was performed following the standard protocol during bolus administration of intravenous contrast.  Contrast: 80mL OMNIPAQUE IOHEXOL 300 MG/ML  SOLN  Comparison: 04/23/2011  Findings: Chronic right basilar pleural thickening and calcification again demonstrated.  The abdominal parenchymal organs are normal in appearance.  A tiny right renal cyst is stable. Gallbladder is unremarkable.  No evidence of hydronephrosis.  No soft tissue masses or lymphadenopathy identified within the abdomen or pelvis.  Brachytherapy seeds are seen throughout the prostate.  Colonic diverticulosis is noted, however there is no evidence of diverticulitis.  No other inflammatory process or abnormal fluid collections are identified.  No suspicious bone lesions are identified.  IMPRESSION:  1.  No acute findings. 2.  Diverticulosis.  No radiographic evidence of diverticulitis.   Original Report Authenticated By: Myles Rosenthal, M.D.     Chart has been reviewed  Assessment/Plan  78  YO male with hx  of mild dementia and CAD here with 3 wk hx of progressive shortness of breath that is not improved with diuresis and lately low grade fever and cough.   Present on Admission:  . Fever - etiology  unclear but low grade, await results of blood culture and urine culture. Does not appear to be toxic. Will hold off on antibiotics unless decompensates.   . Cough - no evidence of PNA on CXR. Possible bronchitis or URI will order repeat CXR.  Marland Kitchen Shortness of breath - likely multifactorial. Will cycle cardiac enzymes, VQ scan in AM, 2d echo. Repeat CXR.    Prophylaxis:  Lovenox, Protonix  CODE STATUS: FULL CODE  Other plan as per orders.  I have spent a total of 55 min on this admission  Neylan Koroma 07/04/2012, 1:28 AM

## 2012-07-04 NOTE — Care Management Note (Unsigned)
    Page 1 of 1   07/04/2012     4:08:19 PM   CARE MANAGEMENT NOTE 07/04/2012  Patient:  Dura,Wendell P   Account Number:  [id number]  Date Initiated:  07/04/2012  Documentation initiated by:  Arnez Stoneking  Subjective/Objective Assessment:   PT ADM ON 07/03/12 WITH SOB AND COUGH ON 07/03/12.  PTA, PT QUITE INDEPENDENT, LIVES WITH CAREGIVER, TRICIA.     Action/Plan:   MET WITH PT AND FAMILY TO DISCUSS DC PLANS.  WILL FOLLOW FOR HOME NEEDS AS PT PROGRESSES.   Anticipated DC Date:  07/06/2012   Anticipated DC Plan:  HOME W HOME HEALTH SERVICES      DC Planning Services  CM consult      Choice offered to / List presented to:             Status of service:  In process, will continue to follow Medicare Important Message given?   (If response is "NO", the following Medicare IM given date fields will be blank) Date Medicare IM given:   Date Additional Medicare IM given:    Discharge Disposition:    Per UR Regulation:  Reviewed for med. necessity/level of care/duration of stay  If discussed at Long Length of Stay Meetings, dates discussed:    Comments:

## 2012-07-04 NOTE — Progress Notes (Addendum)
[facility]             Progress Note   Subjective: Some mild SOB and cough. Appears to have very mild cognitive impairment. Son, daughter and caregiver in the room have been updated on plan of care.  Objective: Vital signs in last 24 hours: Temp:  [97.9 F (36.6 C)-100.2 F (37.9 C)] 98.5 F (36.9 C) (12/05 0447) Pulse Rate:  [74-95] 84  (12/05 0447) Resp:  [18-24] 24  (12/05 0400) BP: (123-145)/(57-85) 136/76 mmHg (12/05 0447) SpO2:  [94 %-98 %] 95 % (12/05 0447) Weight:  [81.3 kg (179 lb 3.7 oz)] 81.3 kg (179 lb 3.7 oz) (12/05 0447) Weight change:  Last BM Date: 07/02/12  Intake/Output from previous day:       Physical Exam: General: Alert, awake, oriented x3, in no acute distress. HEENT: No bruits, no goiter. Heart: Regular rate and rhythm, without murmurs, rubs, gallops. Lungs: Clear to auscultation bilaterally. Abdomen: Soft, nontender, nondistended, positive bowel sounds, appears to have SQ abdominal edema (pitting). Extremities: 2+ pitting edema bilaterally. No calf pain. Neuro: Grossly intact, nonfocal. I have not ambulated him.    Lab Results: Basic Metabolic Panel:  Basename 07/04/12 0545 07/03/12 1901  NA 138 139  K 3.7 4.0  CL 100 101  CO2 26 28  GLUCOSE 96 122*  BUN 21 22  CREATININE 1.41* 1.49*  CALCIUM 9.1 9.5  MG 1.7 --  PHOS 2.9 --   Liver Function Tests:  [facility] 07/04/12 0545 07/03/12 2015  AST 23 25  ALT 12 14  ALKPHOS 44 50  BILITOT 1.1 1.0  PROT 6.4 7.2  ALBUMIN 3.1* 3.7   CBC:  Basename 07/04/12 0545 07/03/12 1901  WBC 13.5* 17.0*  NEUTROABS -- 14.4*  HGB 12.1* 13.6  HCT 35.7* 39.6  MCV 94.9 96.4  PLT 176 197   Cardiac Enzymes:  Basename 07/04/12 1130 07/04/12 0545  CKTOTAL -- --  CKMB -- --  CKMBINDEX -- --  TROPONINI <0.30 <0.30   BNP:  Basename 07/03/12 1901  PROBNP 939.1*   Thyroid Function Tests:  Basename 07/04/12 0545  TSH 0.920  T4TOTAL --  FREET4 --  T3FREE --  THYROIDAB --    Coagulation:  Basename 07/03/12 1901  LABPROT 14.7  INR 1.17   Urinalysis:  Basename 07/03/12 1951  COLORURINE YELLOW  LABSPEC 1.012  PHURINE 5.0  GLUCOSEU NEGATIVE  HGBUR NEGATIVE  BILIRUBINUR NEGATIVE  KETONESUR NEGATIVE  PROTEINUR NEGATIVE  UROBILINOGEN 0.2  NITRITE NEGATIVE  LEUKOCYTESUR NEGATIVE    Studies/Results: Dg Chest 2 View  07/03/2012  *RADIOLOGY REPORT*  Clinical Data: Shortness of breath, headache  CHEST - 2 VIEW  Comparison: 06/20/2012 and 02/06/2012  Findings: Cardiomediastinal silhouette is stable.  Stable right basilar scarring.  Stable bilateral calcified pleural plaques.  No acute infiltrate or pulmonary edema.  Status post CABG.  Mild degenerative changes thoracic spine.  IMPRESSION: No active disease.  Stable right basilar scarring.  Stable calcified pleural plaques bilaterally.   Original Report Authenticated By: Natasha Mead, M.D.    Ct Abdomen Pelvis W Contrast  07/03/2012  *RADIOLOGY REPORT*  Clinical Data: Abdominal pain and distention.  Fever.  Bloating. Recent weight gain. Prostate carcinoma.  CT ABDOMEN AND PELVIS WITH CONTRAST  Technique:  Multidetector CT imaging of the abdomen and pelvis was performed following the standard protocol during bolus administration of intravenous contrast.  Contrast: 80mL OMNIPAQUE IOHEXOL 300 MG/ML  SOLN  Comparison: 04/23/2011  Findings: Chronic right basilar pleural thickening and calcification again demonstrated.  The abdominal parenchymal organs are normal in appearance.  A tiny right renal cyst is stable. Gallbladder is unremarkable.  No evidence of hydronephrosis.  No soft tissue masses or lymphadenopathy identified within the abdomen or pelvis.  Brachytherapy seeds are seen throughout the prostate.  Colonic diverticulosis is noted, however there is no evidence of diverticulitis.  No other inflammatory process or abnormal fluid collections are identified.  No suspicious bone lesions are identified.  IMPRESSION:  1.  No  acute findings. 2.  Diverticulosis.  No radiographic evidence of diverticulitis.   Original Report Authenticated By: Myles Rosenthal, M.D.     Medications: Scheduled Meds:   . [COMPLETED] acetaminophen  650 mg Oral Once  . aspirin EC  81 mg Oral Daily  . aspirin EC  81 mg Oral Daily  . atorvastatin  40 mg Oral q1800  . docusate sodium  100 mg Oral BID  . enoxaparin (LOVENOX) injection  30 mg Subcutaneous Daily  . [EXPIRED] iohexol  20 mL Oral Q1 Hr x 2  . metoprolol succinate  50 mg Oral QHS  . sodium chloride  3 mL Intravenous Q12H  . sodium chloride  3 mL Intravenous Q12H  . [DISCONTINUED] oseltamivir  75 mg Oral Once   Continuous Infusions:  PRN Meds:.sodium chloride, acetaminophen, acetaminophen, ALPRAZolam, HYDROcodone-acetaminophen, [COMPLETED] iohexol, nitroGLYCERIN, ondansetron (ZOFRAN) IV, ondansetron, sodium chloride  Assessment/Plan:  Principal Problem:  *SOB (shortness of breath) Active Problems:  Fever  Cough  Shortness of breath  Edema  CAD (coronary artery disease)  ARF (acute renal failure)   SOB -On further questioning, he has also had some chest tightness and has taken some nitro over the past week. -He also has some LE and SQ abdominal edema. -Wonder what his EF is ? ECHO pending. -He does have a history of CAD with a remote CABG in 1997. -Per patient and family, Dr. Tresa Endo was planning on a stress test as an OP for next week. -He is apparently fairly active at home usually and is mostly independent, but for the past 2 weeks has been having difficulty even getting out of a chair. -So far 2 troponins are negative. -Will ask [facility] to see for further evaluation as I think SOB is most likely an anginal equivalent. -Doubt PE: low pretest prob by [facility] Criteria. Unable to do CT given ARF, I have cancelled V/Q scan.  Fever -Had low-grade temp of 100.2 in the ED. -No apparent source of infection. -Given his cough, will repeat CXR today. -Did have a WBC count of 17  on admission that is down to 13.5 today without intervention. -Currently not on antibiotics.  ARF -He was given torsemide that was discontinued (per family report) when his Cr went as high as 2.9. -Is now down to 1.4. -Unsure if he some degree of CKD.  Weakness -Check B12. -TSH ok. -PT/OT evals.  DVT Prophylaxis -Lovenox.   Time spent coordinating care: 45 minutes   LOS: 1 day   [facility] Pager: [phone] 07/04/2012, 12:51 PM

## 2012-07-04 NOTE — Progress Notes (Signed)
  Echocardiogram 2D Echocardiogram has been performed.  Billy Morgan 07/04/2012, 3:39 PM

## 2012-07-05 LAB — BASIC METABOLIC PANEL
BUN: 26 mg/dL — ABNORMAL HIGH (ref 6–23)
Chloride: 99 mEq/L (ref 96–112)
Creatinine, Ser: 1.6 mg/dL — ABNORMAL HIGH (ref 0.50–1.35)
GFR calc Af Amer: 41 mL/min — ABNORMAL LOW (ref >90)
GFR calc non Af Amer: 36 mL/min — ABNORMAL LOW (ref >90)

## 2012-07-05 LAB — CBC
HCT: 35.8 % — ABNORMAL LOW (ref 39.0–52.0)
MCHC: 33.8 g/dL (ref 30.0–36.0)
RDW: 13 % (ref 11.5–15.5)

## 2012-07-05 MED ORDER — LEVALBUTEROL HCL 0.63 MG/3ML IN NEBU
0.6300 mg | INHALATION_SOLUTION | Freq: Once | RESPIRATORY_TRACT | Status: AC
  Administered 2012-07-05: 0.63 mg via RESPIRATORY_TRACT
  Filled 2012-07-05: qty 3

## 2012-07-05 MED ORDER — AZITHROMYCIN 500 MG PO TABS
500.0000 mg | ORAL_TABLET | Freq: Every day | ORAL | Status: DC
  Administered 2012-07-05 – 2012-07-07 (×3): 500 mg via ORAL
  Filled 2012-07-05 (×3): qty 1

## 2012-07-05 NOTE — Progress Notes (Signed)
[facility]  Subjective: Some abd tenderness.  Objective: Vital signs in last 24 hours: Temp:  [98.1 F (36.7 C)-99 F (37.2 C)] 99 F (37.2 C) (12/06 0938) Pulse Rate:  [73-79] 73  (12/06 1013) Resp:  [18] 18  (12/06 0518) BP: (100-132)/(47-67) 112/64 mmHg (12/06 1013) SpO2:  [95 %-98 %] 95 % (12/06 0938) Weight:  [83.8 kg (184 lb 11.9 oz)] 83.8 kg (184 lb 11.9 oz) (12/06 0518) Last BM Date: 07/03/12  Intake/Output from previous day: 12/05 0701 - 12/06 0700 In: 240 [P.O.:240] Out: 700 [Urine:700] Intake/Output this shift:    Medications Current Facility-Administered Medications  Medication Dose Route Frequency Provider Last Rate Last Dose  . 0.9 %  sodium chloride infusion  250 mL Intravenous PRN Therisa Doyne, MD      . acetaminophen (TYLENOL) tablet 650 mg  650 mg Oral Q6H PRN Therisa Doyne, MD   650 mg at 07/05/12 1013   Or  . acetaminophen (TYLENOL) suppository 650 mg  650 mg Rectal Q6H PRN Therisa Doyne, MD      . ALPRAZolam Prudy Feeler) tablet 0.25 mg  0.25 mg Oral QHS PRN Therisa Doyne, MD      . aspirin EC tablet 81 mg  81 mg Oral Daily Therisa Doyne, MD   81 mg at 07/05/12 1012  . aspirin EC tablet 81 mg  81 mg Oral Daily Therisa Doyne, MD      . atorvastatin (LIPITOR) tablet 40 mg  40 mg Oral q1800 Therisa Doyne, MD   40 mg at 07/04/12 1739  . docusate sodium (COLACE) capsule 100 mg  100 mg Oral BID Therisa Doyne, MD   100 mg at 07/05/12 1012  . enoxaparin (LOVENOX) injection 30 mg  30 mg Subcutaneous Daily Therisa Doyne, MD   30 mg at 07/05/12 1012  . [COMPLETED] furosemide (LASIX) injection 40 mg  40 mg Intravenous Once [employer], [employer]   40 mg at 07/04/12 1738  . HYDROcodone-acetaminophen (NORCO/VICODIN) 5-325 MG per tablet 1-2 tablet  1-2 tablet Oral Q4H PRN Therisa Doyne, MD   2 tablet at 07/04/12 1738  . isosorbide mononitrate (IMDUR) 24 hr tablet 30 mg  30 mg Oral Daily Eda Paschal  [place], [place]   30 mg at 07/05/12 1013  . metoprolol succinate (TOPROL-XL) 24 hr tablet 50 mg  50 mg Oral QHS Therisa Doyne, MD   50 mg at 07/04/12 2203  . nitroGLYCERIN (NITROSTAT) SL tablet 0.4 mg  0.4 mg Sublingual Q5 min PRN Therisa Doyne, MD      . ondansetron (ZOFRAN) tablet 4 mg  4 mg Oral Q6H PRN Therisa Doyne, MD       Or  . ondansetron (ZOFRAN) injection 4 mg  4 mg Intravenous Q6H PRN Therisa Doyne, MD      . sodium chloride 0.9 % injection 3 mL  3 mL Intravenous Q12H Anastassia Doutova, MD      . sodium chloride 0.9 % injection 3 mL  3 mL Intravenous Q12H Therisa Doyne, MD   3 mL at 07/04/12 2206  . sodium chloride 0.9 % injection 3 mL  3 mL Intravenous PRN Therisa Doyne, MD        PE: General appearance: alert, cooperative and no distress Neck: no JVD Lungs: + wheeze, rhonchi. Heart: regular rate and rhythm, S1, S2 normal, no murmur, click, rub or gallop Abdomen: Mildly tender.  + distension.  Percussion notes fluid shift with position change.  +BS  Extremities: No LEE Pulses:  2+ and symmetric Skin: Warm and Dry Neurologic: Grossly normal  Lab Results:   Basename 07/05/12 0510 07/04/12 0545 07/03/12 1901  WBC 13.8* 13.5* 17.0*  HGB 12.1* 12.1* 13.6  HCT 35.8* 35.7* 39.6  PLT 188 176 197   BMET  Basename 07/05/12 0510 07/04/12 0545 07/03/12 1901  NA 137 138 139  K 4.0 3.7 4.0  CL 99 100 101  CO2 24 26 28   GLUCOSE 122* 96 122*  BUN 26* 21 22  CREATININE 1.60* 1.41* 1.49*  CALCIUM 8.8 9.1 9.5   PT/INR  Basename 07/03/12 1901  LABPROT 14.7  INR 1.17   Study Conclusions  - Left ventricle: The cavity size was normal. Wall thickness was increased in a pattern of mild LVH. There was mild concentric hypertrophy. Systolic function was normal. The estimated ejection fraction was in the range of 55% to 65%. Wall motion was normal; there were no regional wall motion abnormalities. Doppler parameters are consistent with abnormal left  ventricular relaxation (grade 1 diastolic dysfunction). - Aortic valve: Trileaflet; mildly thickened, mildly calcified leaflets. - Left atrium: The atrium was mildly dilated. - Atrial septum: No defect or patent foramen ovale was identified.   Assessment/Plan  Principal Problem:  *SOB (shortness of breath) Active Problems:  PROSTATE CANCER  HYPERLIPIDEMIA  ANXIETY  HEARING LOSS  HYPERTENSION  Fever  Cough  Shortness of breath  Edema  CAD, CABG X 5 '97. Low risk Myoview 2/12  Chronic renal disease, stage III, his SCr was up to 2.75 in November 2013 and diuretics held  Plan:  Net fluids -460.  Echo:  Mild LVH. EF 55-60%. Nrl wall motion. Grade 1 diastolic dysfunction.  SCr bumped from 1.41 to 1.60.  He does not need any more lasix.  His abdomen is prominently distended.  Passing gas and BMs.  No issues on Abd/pel CT on 12/4.   According to the patient's daughter, he is scheduled for a stress test on the 13th at our office.  He has been having chest tightness and taking NTG.  Imdur started on 12/5.  No current CP.   ? Whether he needs a nuc and what we will do if abnormal?   Pulmonary exam with diffuse wheeze.  Will give Xopenex x1.  ? Bronchitis.  V/Q scan pending.     LOS: 2 days    HAGER, BRYAN 07/05/2012 10:16 AM  I have seen and evaluated the patient this morning along with Wilburt Finlay, PA. I agree with his findings, examination as well as impression recommendations.  Minimal net Neg with IV Lasix, but Cr increased.  No LE Edema & lungs do not sound like rales, more c/w rhonchi with wheezing.  Echo with normal EF & only Grade 1 diastoilc dysfunction, suspect that abdominal ascites is more related to elevate pulmonary pressures (perhaps from on-going infectious process) despite lack no mention of significant TR jet (hence no calculated PAP estimate despite dilated IVC with blunted respirophasic collapse that would suggest CVP of 15-20 mmHg).  I did not note significant JVD on  exam.    Agree with holding Lasix today to allow volume re-equilibration.  May need dose tomorrow.  Continue supportive Bronchitis / PNA care per [facility].    He has not had any further chest tightness - Imdur just started.  I definitely agree with needing to think lon & hard about downstream cardiac evaluation.  Certainly would not pursue any evaluation until his respiratory issues are resolved.  I spent at least ~10 min (  along with ~5-10 min by Mr. Leron Croak before me) discussing thoughts on cardiac evaluation -- needing to know what we will do with results --> would he entertain the possibility of LHC & PCI, or would he prefer medical Rx.  We can still increase Imdur & consider Ranexa for symptomatic relief.    Downstream Myoview/Cardiolite may be helpful for Risk Stratification to determine evidence & extent of potential ischemia that would help guide [place] to invasive vs. Medical therapy -- having previous studies for comparison would be invaluable.  We will continue to follow.    Marykay Lex, M.D., M.S. [facility] [address] [place], [place]  [address]  [phone] Pager # [phone] 07/05/2012 12:25 PM

## 2012-07-05 NOTE — Evaluation (Signed)
Occupational Therapy Evaluation Patient Details Name: Billy Morgan MRN: [id number] DOB: 05-07-1918 Today's Date: 07/05/2012 Time: 1191-4782 OT Time Calculation (min): 29 min  OT Assessment / Plan / Recommendation Clinical Impression  Pt currently admitted w/ SOB, he was moderately I PTA. He currently requires assist with lower body ADL's and Min A (w/ HHA, VC's for safety & sequencing) toilet transfers & bed mobility, sit to stand tasks. Recommend acute OT services followed by [facility] and 24hr supervision initially vs SNF rehab if no 24hr assist    OT Assessment  Patient needs continued OT Services    Follow Up Recommendations  Home health OT;Supervision/Assistance - 24 hour (HHOT w/ 24hr assist vs SNF)    Barriers to Discharge   Pt lives alone w/ caregiver assist 4 hrs/day only. Family in/out  Equipment Recommendations  3 in 1 bedside comode;Tub/shower seat;Rolling walker with 5" wheels    Recommendations for Other Services  PT eval/treat  Frequency  Min 2X/week    Precautions / Restrictions Precautions Precautions: Fall;Other (comment) (SOB, HOH) Restrictions Weight Bearing Restrictions: No Other Position/Activity Restrictions: Orders in chart for bedrest with bathroom privledges & up with assist noted   Pertinent Vitals/Pain No, denies pain. SOB w/ activity    ADL  Eating/Feeding: Simulated;Modified independent Where Assessed - Eating/Feeding: Bed level Grooming: Performed;Wash/dry hands;Wash/dry face;Set up Where Assessed - Grooming: Supported sitting Upper Body Bathing: Simulated;Set up;Modified independent Where Assessed - Upper Body Bathing: Supported sitting Lower Body Bathing: Simulated;Minimal assistance Where Assessed - Lower Body Bathing: Supported sitting Upper Body Dressing: Simulated;Set up;Min guard Where Assessed - Upper Body Dressing: Supported sitting Lower Body Dressing: Simulated;Minimal assistance;Set up Where Assessed - Lower Body Dressing: Supported  sit to stand Toilet Transfer: Performed;Minimal Dentist Method: Sit to Barista: Comfort height toilet;Grab bars Toileting - Architect and Hygiene: Performed;Min guard;Supervision/safety Where Assessed - Engineer, mining and Hygiene: Sit to stand from 3-in-1 or toilet Tub/Shower Transfer Method: Not assessed Equipment Used: Gait belt;Cane;Other (comment) (HHA Min A (rec RW, PT to assess)) Transfers/Ambulation Related to ADLs: Pt amb with SPC and reaches out for objects (wall, chair, sink w/ decreased safety) rec RW, PT to assess later today. ADL Comments: Pt currently admitted w/ SOB, he is moderately I PTA. He currently requires assist with lower body ADL's and Min A (w/ HHA, VC's for safety & sequencing) toilet transfers. Recommend acute OT services followed by [facility] and 24hr supervision initially.    OT Diagnosis: Generalized weakness  OT Problem List: Decreased strength;Decreased activity tolerance;Decreased safety awareness;Decreased knowledge of precautions OT Treatment Interventions: Self-care/ADL training;Therapeutic activities;Energy conservation;DME and/or AE instruction   OT Goals Acute Rehab OT Goals Time For Goal Achievement: 07/05/12 Potential to Achieve Goals: Good  Visit Information  Last OT Received On: 07/05/12 Assistance Needed: +1    Subjective Data  Subjective: Pt w/o c/o pain, "Just this cough" Patient Stated Goal: Return home w/ PRN assist from family & 4hr/day caregiver   Prior Functioning     Home Living Lives With: Alone Available Help at Discharge: Family;Personal care attendant;Other (Comment) (Caregiver 4 hours per day and family in/out) Type of Home: House Home Access: Stairs to enter [employer] of Steps: 4 Entrance Stairs-Rails: Left Home Layout: One level Bathroom Shower/Tub: Health visitor: Standard Bathroom Accessibility: Yes How Accessible:  Accessible via walker Home Adaptive Equipment: Straight cane Prior Function Level of Independence: Needs assistance Needs Assistance: Light Housekeeping;Meal Prep Meal Prep: Minimal Light Housekeeping: Minimal Able to Take Stairs?:  Yes Vocation: Retired Musician: HOH Dominant Hand: Right    Vision/Perception  Wears glasses, no change from baseline per pt report   Cognition  Overall Cognitive Status: Impaired Area of Impairment: Other (comment);Memory;Safety/judgement (H/O dementia) Arousal/Alertness: Awake/alert Orientation Level: Disoriented to;Place Behavior During Session: WFL for tasks performed Safety/Judgement: Decreased awareness of need for assistance    Extremity/Trunk Assessment Right Upper Extremity Assessment RUE ROM/Strength/Tone: Deficits RUE ROM/Strength/Tone Deficits: Pt with limited R shoulder flexion (AROM to approx 80 degrees) pt reports injury "Months ago" Left Upper Extremity Assessment LUE ROM/Strength/Tone: Within functional levels     Mobility Bed Mobility Bed Mobility: Supine to Sit;Sitting - Scoot to Edge of Bed Supine to Sit: 4: Min assist Sitting - Scoot to [place] of Bed: 4: Min assist Transfers Transfers: Sit to Stand;Stand to Sit Sit to Stand: 4: Min assist;With upper extremity assist;From bed;From chair/3-in-1;With armrests;From toilet Stand to Sit: 4: Min assist;To chair/3-in-1;To toilet                  End of Session OT - End of Session Equipment Utilized During Treatment: Gait belt;Other (comment) [facility] & HHA) Activity Tolerance: Patient limited by fatigue;Other (comment) (& SOB) Patient left: in chair;with call bell/phone within reach;with family/visitor present Nurse Communication: Patient requests pain meds;Other (comment) (Dtr requested temp = 99 degrees)  GO Functional Assessment Tool Used: Clinical Judgement Functional Limitation: Self care Self Care Current Status (U9811): At least 20 percent but less than  40 percent impaired, limited or restricted Self Care Goal Status (B1478): At least 1 percent but less than 20 percent impaired, limited or restricted   Alm Bustard 07/05/2012, 9:56 AM

## 2012-07-05 NOTE — Evaluation (Signed)
Physical Therapy Evaluation Patient Details Name: OSIRIS ODRISCOLL MRN: [id number] DOB: 12-Nov-1917 Today's Date: 07/05/2012 Time: 6295-2841 PT Time Calculation (min): 22 min  PT Assessment / Plan / Recommendation Clinical Impression  Pt adm with SOB.  Pt has been managing at home with intermittent assist.  Has had occasional falls but has lifeline and pt and family understand risk and would like for pt to stay at home with his current set-up.  Functionally for household mobility the cane is as effective and more efficient than the walker in  his home.  Pt and family agreeable to HHPT. Pt motivated to maintain his independence.    PT Assessment  Patient needs continued PT services    Follow Up Recommendations  Home health PT    Does the patient have the potential to tolerate intense rehabilitation      Barriers to Discharge        Equipment Recommendations  None recommended by PT    Recommendations for Other Services     Frequency Min 3X/week    Precautions / Restrictions Precautions Precautions: Fall   Pertinent Vitals/Pain N/A      Mobility  Transfers Sit to Stand: 5: Supervision;With upper extremity assist;With armrests;From chair/3-in-1 Stand to Sit: 5: Supervision;With upper extremity assist;With armrests;To chair/3-in-1 Ambulation/Gait Ambulation/Gait Assistance: 5: Supervision Ambulation Distance (Feet): 200 Feet Assistive device: Rolling walker;Straight cane Ambulation/Gait Assistance Details: With rolling walker needed cues to stay closer to walker.  Verbal/tactile cues to stand more erect. Gait Pattern: Step-through pattern;Decreased stride length;Trunk flexed Gait velocity: decr General Gait Details: When using cane pt uses free hand to steady himself on furniture and walls.  Caregiver and daughter report this is baseline and has been functional for pt in his home.  Gait not markedly improved with rolling walker and may be difficult to manuever in tight spaces  at home.    Shoulder Instructions     Exercises     PT Diagnosis: Difficulty walking;Generalized weakness  PT Problem List: Decreased strength;Decreased balance;Decreased knowledge of use of DME PT Treatment Interventions: DME instruction;Gait training;Functional mobility training;Therapeutic activities;Patient/family education;Therapeutic exercise;Balance training   PT Goals Acute Rehab PT Goals PT Goal Formulation: With patient Time For Goal Achievement: 07/05/12 Potential to Achieve Goals: Good Pt will go Sit to Stand: with modified independence PT Goal: Sit to Stand - Progress: Goal set today Pt will go Stand to Sit: with modified independence PT Goal: Stand to Sit - Progress: Goal set today Pt will Ambulate: 51 - 150 feet;with modified independence PT Goal: Ambulate - Progress: Goal set today  Visit Information  Last PT Received On: 07/05/12 Assistance Needed: +1    Subjective Data  Subjective: "I don't like those," pt stated about walker Patient Stated Goal: Return home   Prior Functioning  Home Living Lives With: Alone Available Help at Discharge: Family;Personal care attendant (Caregiver 4 hours per day) Type of Home: House Home Access: Stairs to enter [employer] of Steps: 4 Entrance Stairs-Rails: Left Home Layout: One level Bathroom Shower/Tub: Health visitor: Standard Bathroom Accessibility: Yes How Accessible: Accessible via walker Home Adaptive Equipment: Straight cane;Walker - rolling Prior Function Level of Independence: Needs assistance Needs Assistance: Light Housekeeping;Meal Prep Meal Prep: Minimal Light Housekeeping: Minimal Able to Take Stairs?: Yes Vocation: Retired Musician: HOH Dominant Hand: Right    Cognition  Overall Cognitive Status: History of cognitive impairments - at baseline Arousal/Alertness: Awake/alert Behavior During Session: [facility] for tasks performed    Extremity/Trunk  Assessment Right Lower  Extremity Assessment RLE ROM/Strength/Tone: Deficits RLE ROM/Strength/Tone Deficits: functionally 4/5 Left Lower Extremity Assessment LLE ROM/Strength/Tone: Deficits LLE ROM/Strength/Tone Deficits: functionally 4/5   Balance Static Standing Balance Static Standing - Balance Support: Right upper extremity supported Static Standing - Level of Assistance: 6: Modified independent (Device/Increase time)  End of Session PT - End of Session Equipment Utilized During Treatment: Gait belt Activity Tolerance: Patient tolerated treatment well Patient left: in chair;with call bell/phone within reach;with family/visitor present  GP Functional Assessment Tool Used: clinical judgement Functional Limitation: Mobility: Walking and moving around Mobility: Walking and Moving Around Current Status [phone]): At least 1 percent but less than 20 percent impaired, limited or restricted Mobility: Walking and Moving Around Goal Status [phone]): 0 percent impaired, limited or restricted   Devereaux Grayson 07/05/2012, 1:51 PM  [facility] PT [phone]

## 2012-07-05 NOTE — Progress Notes (Signed)
[facility]             Progress Note   Subjective: Some mild SOB and cough. Appears to have very mild cognitive impairment. Daughter and caregiver in the room have been updated on plan of care. Sitting in chair.  Objective: Vital signs in last 24 hours: Temp:  [98.1 F (36.7 C)-99 F (37.2 C)] 99 F (37.2 C) (12/06 0938) Pulse Rate:  [73-79] 73  (12/06 1013) Resp:  [18] 18  (12/06 0518) BP: (100-132)/(47-67) 112/64 mmHg (12/06 1013) SpO2:  [95 %-98 %] 95 % (12/06 0938) Weight:  [83.8 kg (184 lb 11.9 oz)] 83.8 kg (184 lb 11.9 oz) (12/06 0518) Weight change: 2.5 kg (5 lb 8.2 oz) Last BM Date: 07/03/12  Intake/Output from previous day: 12/05 0701 - 12/06 0700 In: 240 [P.O.:240] Out: 700 [Urine:700]     Physical Exam: General: Alert, awake, oriented x3, in no acute distress. HEENT: No bruits, no goiter. Heart: Regular rate and rhythm, without murmurs, rubs, gallops. Lungs: Clear to auscultation bilaterally. Abdomen: Soft, nontender, nondistended, positive bowel sounds, appears to have SQ abdominal edema (pitting). Extremities: No edema. No calf pain. Neuro: Grossly intact, nonfocal. I have not ambulated him.    Lab Results: Basic Metabolic Panel:  Basename 07/05/12 0510 07/04/12 0545  NA 137 138  K 4.0 3.7  CL 99 100  CO2 24 26  GLUCOSE 122* 96  BUN 26* 21  CREATININE 1.60* 1.41*  CALCIUM 8.8 9.1  MG -- 1.7  PHOS -- 2.9   Liver Function Tests:  Basename 07/04/12 0545 07/03/12 2015  AST 23 25  ALT 12 14  ALKPHOS 44 50  BILITOT 1.1 1.0  PROT 6.4 7.2  ALBUMIN 3.1* 3.7   CBC:  Basename 07/05/12 0510 07/04/12 0545 07/03/12 1901  WBC 13.8* 13.5* --  NEUTROABS -- -- 14.4*  HGB 12.1* 12.1* --  HCT 35.8* 35.7* --  MCV 96.0 94.9 --  PLT 188 176 --   Cardiac Enzymes:  Basename 07/04/12 1614 07/04/12 1130 07/04/12 0545  CKTOTAL -- -- --  CKMB -- -- --  CKMBINDEX -- -- --  TROPONINI <0.30 <0.30 <0.30   BNP:  Basename 07/03/12 1901   PROBNP 939.1*   Thyroid Function Tests:  Basename 07/04/12 0545  TSH 0.920  T4TOTAL --  FREET4 --  T3FREE --  THYROIDAB --   Coagulation:  Basename 07/03/12 1901  LABPROT 14.7  INR 1.17   Urinalysis:  Basename 07/03/12 1951  COLORURINE YELLOW  LABSPEC 1.012  PHURINE 5.0  GLUCOSEU NEGATIVE  HGBUR NEGATIVE  BILIRUBINUR NEGATIVE  KETONESUR NEGATIVE  PROTEINUR NEGATIVE  UROBILINOGEN 0.2  NITRITE NEGATIVE  LEUKOCYTESUR NEGATIVE    Studies/Results: Dg Chest 2 View  07/03/2012  *RADIOLOGY REPORT*  Clinical Data: Shortness of breath, headache  CHEST - 2 VIEW  Comparison: 06/20/2012 and 02/06/2012  Findings: Cardiomediastinal silhouette is stable.  Stable right basilar scarring.  Stable bilateral calcified pleural plaques.  No acute infiltrate or pulmonary edema.  Status post CABG.  Mild degenerative changes thoracic spine.  IMPRESSION: No active disease.  Stable right basilar scarring.  Stable calcified pleural plaques bilaterally.   Original Report Authenticated By: Natasha Mead, M.D.    Ct Abdomen Pelvis W Contrast  07/03/2012  *RADIOLOGY REPORT*  Clinical Data: Abdominal pain and distention.  Fever.  Bloating. Recent weight gain. Prostate carcinoma.  CT ABDOMEN AND PELVIS WITH CONTRAST  Technique:  Multidetector CT imaging of the abdomen and pelvis was performed following the standard protocol during  bolus administration of intravenous contrast.  Contrast: 80mL OMNIPAQUE IOHEXOL 300 MG/ML  SOLN  Comparison: 04/23/2011  Findings: Chronic right basilar pleural thickening and calcification again demonstrated.  The abdominal parenchymal organs are normal in appearance.  A tiny right renal cyst is stable. Gallbladder is unremarkable.  No evidence of hydronephrosis.  No soft tissue masses or lymphadenopathy identified within the abdomen or pelvis.  Brachytherapy seeds are seen throughout the prostate.  Colonic diverticulosis is noted, however there is no evidence of diverticulitis.  No  other inflammatory process or abnormal fluid collections are identified.  No suspicious bone lesions are identified.  IMPRESSION:  1.  No acute findings. 2.  Diverticulosis.  No radiographic evidence of diverticulitis.   Original Report Authenticated By: Myles Rosenthal, M.D.    Dg Chest Port 1 View  07/04/2012  *RADIOLOGY REPORT*  Clinical Data: Pneumonia.  CHEST - 1 VIEW  Comparison:  07/03/2012.  Findings: Lung volumes are low accentuating the previously described  changes at the bases.  No new focal abnormalities.  No edema.  Status post CABG.  Heart and mediastinal structures otherwise appear normal.  No effusions or pneumothoraces.  Stable calcified pleural plaque overlying the left hemidiaphragm.  IMPRESSION: Basilar scarring accentuated by limited respiratory effort.  No focal abnormalities.  Chronic changes are stable.   Original Report Authenticated By: Sander Radon, M.D.     Medications: Scheduled Meds:    . aspirin EC  81 mg Oral Daily  . aspirin EC  81 mg Oral Daily  . atorvastatin  40 mg Oral q1800  . docusate sodium  100 mg Oral BID  . enoxaparin (LOVENOX) injection  30 mg Subcutaneous Daily  . [COMPLETED] furosemide  40 mg Intravenous Once  . isosorbide mononitrate  30 mg Oral Daily  . metoprolol succinate  50 mg Oral QHS  . sodium chloride  3 mL Intravenous Q12H  . sodium chloride  3 mL Intravenous Q12H   Continuous Infusions:  PRN Meds:.sodium chloride, acetaminophen, acetaminophen, ALPRAZolam, HYDROcodone-acetaminophen, nitroGLYCERIN, ondansetron (ZOFRAN) IV, ondansetron, sodium chloride  Assessment/Plan:  Principal Problem:  *SOB (shortness of breath) Active Problems:  PROSTATE CANCER  HYPERLIPIDEMIA  ANXIETY  HEARING LOSS  HYPERTENSION  Fever  Cough  Shortness of breath  Edema  CAD, CABG X 5 '97. Low risk Myoview 2/12  Chronic renal disease, stage III, his SCr was up to 2.75 in November 2013 and diuretics held   SOB -On further questioning, he has also  had some chest tightness and has taken some nitro over the past week. -ECHO with good EF but does have some diastolic dysfunction. -He does have a history of CAD with a remote CABG in 1997. -Per patient and family, Dr. Tresa Endo was planning on a stress test as an OP for next week. -He is apparently fairly active at home usually and is mostly independent, but for the past 2 weeks has been having difficulty even getting out of a chair. -Has ruled out for ACS. -Cardiology following. They will decide whether to precede with stress test as inpatient. -Doubt PE: low pretest prob by [facility] Criteria. Unable to do CT given ARF, I have cancelled V/Q scan. -Continues to have some green secretions. -I have repeated CXR. He does not have an infectious process to account for his SOB. -Is currently not on antibiotics. Afebrile since admission.  ARF -He was given torsemide that was discontinued (per family report) when his Cr went as high as 2.9. -Cr is up from 1.4 to 1.6 after  1 dose of lasix given yesterday. -Unsure if he some degree of CKD. -Follow Cr closely.  Weakness -TSH/B12 ok. -PT/OT evals.  DVT Prophylaxis -Lovenox.  Disposition -Anticipate DC home pending cardiology recommendations.   Time spent coordinating care: 45 minutes   LOS: 2 days   [facility] Pager: [phone] 07/05/2012, 10:18 AM

## 2012-07-06 ENCOUNTER — Observation Stay (HOSPITAL_COMMUNITY): Payer: Medicare PPO

## 2012-07-06 DIAGNOSIS — R0602 Shortness of breath: Secondary | ICD-10-CM

## 2012-07-06 DIAGNOSIS — J069 Acute upper respiratory infection, unspecified: Secondary | ICD-10-CM

## 2012-07-06 LAB — EXPECTORATED SPUTUM ASSESSMENT W GRAM STAIN, RFLX TO RESP C

## 2012-07-06 LAB — CBC
HCT: 35.9 % — ABNORMAL LOW (ref 39.0–52.0)
Platelets: 197 10*3/uL (ref 150–400)
RDW: 12.7 % (ref 11.5–15.5)
WBC: 10.6 10*3/uL — ABNORMAL HIGH (ref 4.0–10.5)

## 2012-07-06 LAB — BASIC METABOLIC PANEL
Chloride: 98 mEq/L (ref 96–112)
GFR calc Af Amer: 47 mL/min — ABNORMAL LOW (ref >90)
Potassium: 4.1 mEq/L (ref 3.5–5.1)
Sodium: 134 mEq/L — ABNORMAL LOW (ref 135–145)

## 2012-07-06 LAB — PRO B NATRIURETIC PEPTIDE: Pro B Natriuretic peptide (BNP): 828 pg/mL — ABNORMAL HIGH (ref 0–450)

## 2012-07-06 MED ORDER — TECHNETIUM TC 99M DIETHYLENETRIAME-PENTAACETIC ACID
33.7000 | Freq: Once | INTRAVENOUS | Status: AC | PRN
  Administered 2012-07-06: 33.7 via INTRAVENOUS

## 2012-07-06 MED ORDER — TECHNETIUM TO 99M ALBUMIN AGGREGATED
6.2000 | Freq: Once | INTRAVENOUS | Status: AC | PRN
  Administered 2012-07-06: 6 via INTRAVENOUS

## 2012-07-06 MED ORDER — FUROSEMIDE 10 MG/ML IJ SOLN: 20.0000 mg | Freq: Once | INTRAMUSCULAR | Status: DC

## 2012-07-06 NOTE — Progress Notes (Signed)
[facility] Progress Note  Subjective:  Breathing better, still with mild sob.  Objective:   Vital Signs in the last 24 hours: Temp:  [98.2 F (36.8 C)-99.1 F (37.3 C)] 99 F (37.2 C) (12/07 4098) Pulse Rate:  [65-75] 75  (12/07 0614) Resp:  [18] 18  (12/07 0614) BP: (96-128)/(52-64) 128/55 mmHg (12/07 0614) SpO2:  [94 %-98 %] 98 % (12/07 0614) Weight:  [83.5 kg (184 lb 1.4 oz)] 83.5 kg (184 lb 1.4 oz) (12/07 1191)  Intake/Output from previous day: 12/06 0701 - 12/07 0700 In: 3 [I.V.:3] Out: -   Scheduled:   . aspirin EC  81 mg Oral Daily  . aspirin EC  81 mg Oral Daily  . atorvastatin  40 mg Oral q1800  . azithromycin  500 mg Oral Daily  . docusate sodium  100 mg Oral BID  . enoxaparin (LOVENOX) injection  30 mg Subcutaneous Daily  . isosorbide mononitrate  30 mg Oral Daily  . [COMPLETED] levalbuterol  0.63 mg Nebulization Once  . metoprolol succinate  50 mg Oral QHS  . sodium chloride  3 mL Intravenous Q12H  . sodium chloride  3 mL Intravenous Q12H    Physical Exam:   General appearance: alert, cooperative and no distress Neck: no JVD Lungs: scattered rhocho with faint exp wheezing Heart: RRR 1/6 sem Abdomen: distended, BS+ Extremities: trivial edema   Rate: 80  Rhythm: normal sinus rhythm  Lab Results:     Basename 07/05/12 0510 07/04/12 0545  NA 137 138  K 4.0 3.7  CL 99 100  CO2 24 26  GLUCOSE 122* 96  BUN 26* 21  CREATININE 1.60* 1.41*    Basename 07/04/12 1614 07/04/12 1130  TROPONINI <0.30 <0.30   Hepatic Function Panel  Basename 07/04/12 0545 07/03/12 2015  PROT 6.4 --  ALBUMIN 3.1* --  AST 23 --  ALT 12 --  ALKPHOS 44 --  BILITOT 1.1 --  BILIDIR -- 0.2  IBILI -- 0.8    Basename 07/03/12 1901  INR 1.17   BNP (last 3 results)  Basename 07/03/12 1901 06/20/12 1115 02/06/12 1716  PROBNP 939.1* 277.2 180.0*    Lipid Panel     Component Value Date/Time   CHOL 124 02/06/2012 1721   TRIG 165.0*  02/06/2012 1721   HDL 35.90* 02/06/2012 1721   CHOLHDL 3 02/06/2012 1721   VLDL 33.0 02/06/2012 1721   LDLCALC 55 02/06/2012 1721     Imaging:  Dg Chest Port 1 View  07/04/2012  *RADIOLOGY REPORT*  Clinical Data: Pneumonia.  CHEST - 1 VIEW  Comparison:  07/03/2012.  Findings: Lung volumes are low accentuating the previously described  changes at the bases.  No new focal abnormalities.  No edema.  Status post CABG.  Heart and mediastinal structures otherwise appear normal.  No effusions or pneumothoraces.  Stable calcified pleural plaque overlying the left hemidiaphragm.  IMPRESSION: Basilar scarring accentuated by limited respiratory effort.  No focal abnormalities.  Chronic changes are stable.   Original Report Authenticated By: Sander Radon, M.D.       Assessment/Plan:   Principal Problem:  *SOB (shortness of breath) Active Problems:  PROSTATE CANCER  HYPERLIPIDEMIA  ANXIETY  HEARING LOSS  HYPERTENSION  Fever  Cough  Shortness of breath  Edema  CAD, CABG X 5 '97. Low risk Myoview 2/12  Chronic renal disease, stage III, his SCr was up to 2.75 in November 2013 and diuretics held  Cr yesterday was 1.6, slightly  increased from admission but markedly improved from several weeks ago when 2.75 and losartan and NSAID rx were discontinued. Will give lasix 20 mg orally today. F/u BNP, CBC and CXR. No need for myoview scan during present admission; but will probably do as an outpatient to risk stratify.   Lennette Bihari, MD, [facility] 07/06/2012, 8:32 AM

## 2012-07-06 NOTE — Progress Notes (Signed)
[facility]             Progress Note   Subjective: Some mild SOB and cough altho he does not have any oxygen requirements. Appears to have  mild cognitive impairment. Son and caregiver in the room have been updated on plan of care. Sitting in chair.  Objective: Vital signs in last 24 hours: Temp:  [98.2 F (36.8 C)-99.1 F (37.3 C)] 99 F (37.2 C) (12/07 1191) Pulse Rate:  [65-75] 75  (12/07 0614) Resp:  [18] 18  (12/07 0614) BP: (96-128)/(52-58) 128/55 mmHg (12/07 0614) SpO2:  [94 %-98 %] 98 % (12/07 0614) Weight:  [83.5 kg (184 lb 1.4 oz)] 83.5 kg (184 lb 1.4 oz) (12/07 4782) Weight change: -0.3 kg (-10.6 oz) Last BM Date: 07/05/12  Intake/Output from previous day: 12/06 0701 - 12/07 0700 In: 3 [I.V.:3] Out: -  Total I/O In: 240 [P.O.:240] Out: 150 [Urine:150]   Physical Exam: General: Alert, awake, oriented x3, in no acute distress. HEENT: No bruits, no goiter. Heart: Regular rate and rhythm, without murmurs, rubs, gallops. Lungs: Clear to auscultation bilaterally. Abdomen: Soft, nontender, nondistended, positive bowel sounds, appears to have SQ abdominal edema (pitting). Extremities: No edema. No calf pain. Neuro: Grossly intact, nonfocal. I have not ambulated him.    Lab Results: Basic Metabolic Panel:  Basename 07/05/12 0510 07/04/12 0545  NA 137 138  K 4.0 3.7  CL 99 100  CO2 24 26  GLUCOSE 122* 96  BUN 26* 21  CREATININE 1.60* 1.41*  CALCIUM 8.8 9.1  MG -- 1.7  PHOS -- 2.9   Liver Function Tests:  Basename 07/04/12 0545 07/03/12 2015  AST 23 25  ALT 12 14  ALKPHOS 44 50  BILITOT 1.1 1.0  PROT 6.4 7.2  ALBUMIN 3.1* 3.7   CBC:  Basename 07/06/12 1210 07/05/12 0510 07/03/12 1901  WBC 10.6* 13.8* --  NEUTROABS -- -- 14.4*  HGB 12.2* 12.1* --  HCT 35.9* 35.8* --  MCV 96.5 96.0 --  PLT 197 188 --   Cardiac Enzymes:  Basename 07/04/12 1614 07/04/12 1130 07/04/12 0545  CKTOTAL -- -- --  CKMB -- -- --  CKMBINDEX -- -- --   TROPONINI <0.30 <0.30 <0.30   BNP:  Basename 07/03/12 1901  PROBNP 939.1*   Thyroid Function Tests:  Basename 07/04/12 0545  TSH 0.920  T4TOTAL --  FREET4 --  T3FREE --  THYROIDAB --   Coagulation:  Basename 07/03/12 1901  LABPROT 14.7  INR 1.17   Urinalysis:  Basename 07/03/12 1951  COLORURINE YELLOW  LABSPEC 1.012  PHURINE 5.0  GLUCOSEU NEGATIVE  HGBUR NEGATIVE  BILIRUBINUR NEGATIVE  KETONESUR NEGATIVE  PROTEINUR NEGATIVE  UROBILINOGEN 0.2  NITRITE NEGATIVE  LEUKOCYTESUR NEGATIVE    Studies/Results: Nm Pulmonary Perf And Vent  07/06/2012  *RADIOLOGY REPORT*  Clinical Data:  Shortness of breath and chest pain.  NUCLEAR MEDICINE VENTILATION - PERFUSION LUNG SCAN  Technique:  Ventilation images were obtained in multiple projections using inhaled aerosol technetium 99 M DTPA.  Perfusion images were obtained in multiple projections after intravenous injection of Tc-55m MAA.  Radiopharmaceuticals:  33. Tc-62m DTPA aerosol and 6.2 mCi Tc- 54m MAA.  Comparison: Plain film of the chest 07/04/2012.  Findings:  Ventilation:  No focal abnormality is identified.  Radiotracer distribution is slightly heterogeneous, likely artifactual.  Perfusion:  No segmental or subsegmental defect is identified with mild heterogeneity noted.  IMPRESSION: Low probability for pulmonary embolus.   Original Report Authenticated By: Holley Dexter,  M.D.    Dg Chest Port 1 View  07/04/2012  *RADIOLOGY REPORT*  Clinical Data: Pneumonia.  CHEST - 1 VIEW  Comparison:  07/03/2012.  Findings: Lung volumes are low accentuating the previously described  changes at the bases.  No new focal abnormalities.  No edema.  Status post CABG.  Heart and mediastinal structures otherwise appear normal.  No effusions or pneumothoraces.  Stable calcified pleural plaque overlying the left hemidiaphragm.  IMPRESSION: Basilar scarring accentuated by limited respiratory effort.  No focal abnormalities.  Chronic changes are  stable.   Original Report Authenticated By: Sander Radon, M.D.     Medications: Scheduled Meds:    . aspirin EC  81 mg Oral Daily  . aspirin EC  81 mg Oral Daily  . atorvastatin  40 mg Oral q1800  . azithromycin  500 mg Oral Daily  . docusate sodium  100 mg Oral BID  . enoxaparin (LOVENOX) injection  30 mg Subcutaneous Daily  . furosemide  20 mg Intravenous Once  . isosorbide mononitrate  30 mg Oral Daily  . metoprolol succinate  50 mg Oral QHS  . sodium chloride  3 mL Intravenous Q12H  . sodium chloride  3 mL Intravenous Q12H   Continuous Infusions:  PRN Meds:.sodium chloride, acetaminophen, acetaminophen, ALPRAZolam, HYDROcodone-acetaminophen, nitroGLYCERIN, ondansetron (ZOFRAN) IV, ondansetron, sodium chloride, [COMPLETED] technetium albumin aggregated, [COMPLETED] technetium TC 71M diethylenetriame-pentaacetic acid  Assessment/Plan:  Principal Problem:  *SOB (shortness of breath) Active Problems:  PROSTATE CANCER  HYPERLIPIDEMIA  ANXIETY  HEARING LOSS  HYPERTENSION  Fever  Cough  Shortness of breath  Edema  CAD, CABG X 5 '97. Low risk Myoview 2/12  Chronic renal disease, stage III, his SCr was up to 2.75 in November 2013 and diuretics held   SOB -On further questioning, he has also had some chest tightness and has taken some nitro over the past week. Caregiver just took his pants yesterday and noted that the nitro bottle that he carries in his pocket was half empty. -ECHO with good EF but does have some diastolic dysfunction. -He does have a history of CAD with a remote CABG in 1997. -He is apparently fairly active at home usually and is mostly independent, but for the past 2 weeks has been having difficulty even getting out of a chair. -Has ruled out for ACS. -VQ scan negative for PE. -Continues to have some yellow secretions. -Has had a total of THREE CXRs since admission that are all negative for source of infection. -At family's insistence have placed him  on a Z-Pak for 5 days. -I believe the SOB is more of an anginal equivalent. -I have spoken with Dr. Tresa Endo who does not believe cardiac ischemia is the main culprit. -In any case, patient and family have decided to defer further cardiac work up given his age. -There is also a question of a mild diastolic CHF event. Cards has ordered lasix 20 mg for today.    ARF -He was given torsemide that was discontinued (per family report) when his Cr went as high as 2.9. -Unsure if he has some degree of CKD. -Follow Cr closely especially with additional diuretic dose.  Weakness -TSH/B12 ok. -Per PT/OT evals ok to return home to current setting.  DVT Prophylaxis -Lovenox.  Disposition -Anticipate DC home in am.   Time spent coordinating care: 35 minutes   LOS: 3 days   HERNANDEZ ACOSTA,Dyke Weible [facility] Pager: [phone] 07/06/2012, 1:13 PM

## 2012-07-07 DIAGNOSIS — R05 Cough: Secondary | ICD-10-CM

## 2012-07-07 LAB — BASIC METABOLIC PANEL
Chloride: 100 mEq/L (ref 96–112)
GFR calc Af Amer: 45 mL/min — ABNORMAL LOW (ref >90)
GFR calc non Af Amer: 39 mL/min — ABNORMAL LOW (ref >90)
Potassium: 4.2 mEq/L (ref 3.5–5.1)
Sodium: 135 mEq/L (ref 135–145)

## 2012-07-07 MED ORDER — LEVALBUTEROL HCL 0.63 MG/3ML IN NEBU
0.6300 mg | INHALATION_SOLUTION | Freq: Four times a day (QID) | RESPIRATORY_TRACT | Status: DC
  Administered 2012-07-07: 0.63 mg via RESPIRATORY_TRACT
  Filled 2012-07-07 (×5): qty 3

## 2012-07-07 MED ORDER — AZITHROMYCIN 500 MG PO TABS: 500.0000 mg | ORAL_TABLET | Freq: Every day | ORAL | Status: DC

## 2012-07-07 MED ORDER — POLYETHYLENE GLYCOL 3350 17 G PO PACK
17.0000 g | PACK | Freq: Once | ORAL | Status: AC
  Administered 2012-07-07: 17 g via ORAL
  Filled 2012-07-07: qty 1

## 2012-07-07 MED ORDER — PSYLLIUM 95 % PO PACK
1.0000 | PACK | Freq: Every day | ORAL | Status: DC
  Administered 2012-07-07: 1 via ORAL
  Filled 2012-07-07: qty 1

## 2012-07-07 MED ORDER — POLYETHYLENE GLYCOL 3350 17 G PO PACK: 17.0000 g | PACK | Freq: Every day | ORAL | Status: DC

## 2012-07-07 MED ORDER — ISOSORBIDE MONONITRATE ER 30 MG PO TB24: 30.0000 mg | ORAL_TABLET | Freq: Every day | ORAL | Status: DC

## 2012-07-07 NOTE — Progress Notes (Signed)
[facility]  Subjective: +cough and LLQ pain  Objective: Vital signs in last 24 hours: Temp:  [98 F (36.7 C)-98.9 F (37.2 C)] 98.9 F (37.2 C) (12/08 0324) Pulse Rate:  [70-73] 72  (12/08 0324) Resp:  [18] 18  (12/08 0324) BP: (114-126)/(50-62) 121/62 mmHg (12/08 0324) SpO2:  [97 %-99 %] 97 % (12/08 0324) Last BM Date: 07/03/12  Intake/Output from previous day: 12/07 0701 - 12/08 0700 In: 480 [P.O.:480] Out: 350 [Urine:350] Intake/Output this shift:    Medications Current Facility-Administered Medications  Medication Dose Route Frequency Provider Last Rate Last Dose  . 0.9 %  sodium chloride infusion  250 mL Intravenous PRN Therisa Doyne, MD      . acetaminophen (TYLENOL) tablet 650 mg  650 mg Oral Q6H PRN Therisa Doyne, MD   650 mg at 07/05/12 1013   Or  . acetaminophen (TYLENOL) suppository 650 mg  650 mg Rectal Q6H PRN Therisa Doyne, MD      . ALPRAZolam Prudy Feeler) tablet 0.25 mg  0.25 mg Oral QHS PRN Therisa Doyne, MD      . aspirin EC tablet 81 mg  81 mg Oral Daily Therisa Doyne, MD   81 mg at 07/06/12 1141  . aspirin EC tablet 81 mg  81 mg Oral Daily Therisa Doyne, MD      . atorvastatin (LIPITOR) tablet 40 mg  40 mg Oral q1800 Therisa Doyne, MD   40 mg at 07/06/12 1755  . azithromycin (ZITHROMAX) tablet 500 mg  500 mg Oral Daily Henderson Cloud, MD   500 mg at 07/06/12 1141  . docusate sodium (COLACE) capsule 100 mg  100 mg Oral BID Therisa Doyne, MD   100 mg at 07/06/12 2105  . enoxaparin (LOVENOX) injection 30 mg  30 mg Subcutaneous Daily Therisa Doyne, MD   30 mg at 07/06/12 1142  . furosemide (LASIX) injection 20 mg  20 mg Intravenous Once Henderson Cloud, MD      . HYDROcodone-acetaminophen (NORCO/VICODIN) 5-325 MG per tablet 1-2 tablet  1-2 tablet Oral Q4H PRN Therisa Doyne, MD   2 tablet at 07/07/12 0757  . isosorbide mononitrate (IMDUR) 24 hr tablet 30 mg  30 mg  Oral Daily Eda Paschal Stanton, [place]   30 mg at 07/06/12 1141  . metoprolol succinate (TOPROL-XL) 24 hr tablet 50 mg  50 mg Oral QHS Therisa Doyne, MD   50 mg at 07/06/12 2105  . nitroGLYCERIN (NITROSTAT) SL tablet 0.4 mg  0.4 mg Sublingual Q5 min PRN Therisa Doyne, MD      . ondansetron (ZOFRAN) tablet 4 mg  4 mg Oral Q6H PRN Therisa Doyne, MD       Or  . ondansetron (ZOFRAN) injection 4 mg  4 mg Intravenous Q6H PRN Therisa Doyne, MD      . psyllium (HYDROCIL/METAMUCIL) packet 1 packet  1 packet Oral Daily Estela Isaiah Blakes, MD      . sodium chloride 0.9 % injection 3 mL  3 mL Intravenous Q12H Anastassia Doutova, MD      . sodium chloride 0.9 % injection 3 mL  3 mL Intravenous Q12H Therisa Doyne, MD   3 mL at 07/05/12 1843  . sodium chloride 0.9 % injection 3 mL  3 mL Intravenous PRN Therisa Doyne, MD      . [COMPLETED] technetium albumin aggregated (MAA) injection solution 6 milli Curie  6 milli Curie Intravenous Once PRN Medication Radiologist, MD   6 milli  Curie at 07/06/12 1030  . [COMPLETED] technetium TC 57M diethylenetriame-pentaacetic acid (DTPA) injection 33.7 milli Curie  33.7 milli Curie Intravenous Once PRN Medication Radiologist, MD   33.7 milli Curie at 07/06/12 1005    PE: General appearance: alert, cooperative and no distress Lungs: Diffuse wheeze Heart: regular rate and rhythm, S1, S2 normal, no murmur, click, rub or gallop Abdomen: +BS, + distension.  Tender in the LLQ  Extremities: No LEE Pulses: 2+ and symmetric Skin: Warm and dry. Neurologic: Grossly normal  Lab Results:   Basename 07/06/12 1210 07/05/12 0510  WBC 10.6* 13.8*  HGB 12.2* 12.1*  HCT 35.9* 35.8*  PLT 197 188   BMET  Basename 07/07/12 0600 07/06/12 1210 07/05/12 0510  NA 135 134* 137  K 4.2 4.1 4.0  CL 100 98 99  CO2 27 26 24   GLUCOSE 101* 130* 122*  BUN 17 21 26*  CREATININE 1.50* 1.44* 1.60*  CALCIUM 8.7 9.0 8.8   NUCLEAR MEDICINE VENTILATION - PERFUSION  LUNG SCAN  Technique: Ventilation images were obtained in multiple  projections using inhaled aerosol technetium 99 M DTPA. Perfusion  images were obtained in multiple projections after intravenous  injection of Tc-7m MAA.  Radiopharmaceuticals: 33. Tc-38m DTPA aerosol and 6.2 mCi Tc-  59m MAA.  Comparison: Plain film of the chest 07/04/2012.  Findings:  Ventilation: No focal abnormality is identified. Radiotracer  distribution is slightly heterogeneous, likely artifactual.  Perfusion: No segmental or subsegmental defect is identified with  mild heterogeneity noted.  IMPRESSION:  Low probability for pulmonary embolus.  Assessment/Plan  Principal Problem:  *SOB (shortness of breath) Active Problems:  PROSTATE CANCER  HYPERLIPIDEMIA  ANXIETY  HEARING LOSS  HYPERTENSION  Fever  Cough  Shortness of breath  Edema  CAD, CABG X 5 '97. Low risk Myoview 2/12  Chronic renal disease, stage III, his SCr was up to 2.75 in November 2013 and diuretics held  Plan:   The patient does no appear to be in heart failure.  Lasix 20mg , PO x1 yesterday.  Slight bump in SCr.   BNP 828.0 which is decreased from 939.1 on 12/4.  I would recommend lasix on a PRN basis.   Possible OP NST per Dr. Tresa Endo.  BP and HR stable.   He did well after having a breathing treatment with Xopenex the other day according to his caregiver.  Will add again.  His other problem is his abdominal distention and tenderness.  Possible related to constipation.  Currently on Docusate and psyllium.  Enema may be helpful as well as Abdominal xray.  He is passing flatulence according to caregiver.  We will likely SO.   LOS: 4 days    Cono Gebhard 07/07/2012 9:07 AM

## 2012-07-07 NOTE — Progress Notes (Signed)
Pt. Seen and examined. Agree with the NP/PA-C note as written.  No clear signs of heart failure, mild improvement in BNP with lasix at the expense of creatinine. I think prn dosing is reasonable. No further cardiac work-up per patient and family wishes at this time. Will sign-off. Follow-up with Dr. Tresa Endo.  Probably okay for d/c soon.  Chrystie Nose, MD, [facility] Attending Cardiologist [facility]

## 2012-07-07 NOTE — Progress Notes (Signed)
NCM spoke to pt's son, Dorinda Hill. States he is requesting [facility] for [facility] PT and OT. Provided best contact is caregiver, [name] [phone]. She is at home with pt during the day. Faxed orders and facesheet with verified address and phone number to [facility]. Provided son with [facility] contact number to call if he does not hear from the agency. Isidoro Donning RN CCM Case Mgmt phone [phone]

## 2012-07-07 NOTE — Discharge Summary (Signed)
Physician Discharge Summary  Patient ID: Billy Morgan MRN: [id number] DOB/AGE: 1918-01-25 78 y.o.  Admit date: 07/03/2012 Discharge date: 07/07/2012  Primary Care Physician:  Michele Mcalpine, MD   Discharge Diagnoses:    Principal Problem:  *SOB (shortness of breath) Active Problems:  PROSTATE CANCER  HYPERLIPIDEMIA  ANXIETY  HEARING LOSS  HYPERTENSION  Fever  Cough  Shortness of breath  Edema  CAD, CABG X 5 '97. Low risk Myoview 2/12  Chronic renal disease, stage III, his SCr was up to 2.75 in November 2013 and diuretics held      Medication List     As of 07/07/2012 11:36 AM    STOP taking these medications         metolazone 5 MG tablet   Commonly known as: ZAROXOLYN      torsemide 20 MG tablet   Commonly known as: DEMADEX      TAKE these medications         ALPRAZolam 0.25 MG tablet   Commonly known as: XANAX   Take 0.25 mg by mouth at bedtime as needed. Takes for nightmares      aspirin 81 MG tablet   Take 81 mg by mouth daily.      azithromycin 500 MG tablet   Commonly known as: ZITHROMAX   Take 1 tablet (500 mg total) by mouth daily.      isosorbide mononitrate 30 MG 24 hr tablet   Commonly known as: IMDUR   Take 1 tablet (30 mg total) by mouth daily.      ketoconazole 2 % cream   Commonly known as: NIZORAL   Apply 1 application topically daily. Apply as directed      metoprolol succinate 50 MG 24 hr tablet   Commonly known as: TOPROL-XL   Take 50 mg by mouth at bedtime.      multivitamin tablet   Take 1 tablet by mouth daily.      nitroGLYCERIN 0.4 MG SL tablet   Commonly known as: NITROSTAT   Place 0.4 mg under the tongue every 5 (five) minutes as needed. For chest pain      polyethylene glycol packet   Commonly known as: MIRALAX / GLYCOLAX   Take 17 g by mouth daily.      rosuvastatin 20 MG tablet   Commonly known as: CRESTOR   Take 20 mg by mouth daily.         Disposition and Follow-up:  Will be discharged home today in  stable and improved condition. Will need to follow up with his PCP in 2 weeks and with Dr. Tresa Endo as scheduled by his office.  Consults:  Cardiology, Dr. Tresa Endo.   Significant Diagnostic Studies:  Dg Chest 2 View  07/06/2012  *RADIOLOGY REPORT*  Clinical Data: Rhonchi.  Evaluate for possible pneumonia.  CHEST - 2 VIEW  Comparison: 07/04/2012  Findings: Changes from CABG surgery are stable.  The cardiac silhouette is normal in size and configuration.  No mediastinal or hilar masses or adenopathy.  Calcified pleural plaque along the left hemidiaphragm.  The right hemidiaphragm is mildly elevated.  There are coarse reticular opacities above the right hemidiaphragm most likely scarring and / or subsegmental atelectasis.  There is no convincing infiltrate and no pulmonary edema.  No pleural effusion or pneumothorax is seen.  The bony thorax is demineralized.  There are arthropathic changes of the right shoulder.  IMPRESSION: No acute cardiopulmonary disease.   Original Report Authenticated By: Amie Portland, M.D.  [facility] Vent  07/06/2012  *RADIOLOGY REPORT*  Clinical Data:  Shortness of breath and chest pain.  NUCLEAR MEDICINE VENTILATION - PERFUSION LUNG SCAN  Technique:  Ventilation images were obtained in multiple projections using inhaled aerosol technetium 99 M DTPA.  Perfusion images were obtained in multiple projections after intravenous injection of Tc-34m MAA.  Radiopharmaceuticals:  33. Tc-7m DTPA aerosol and 6.2 mCi Tc- 50m MAA.  Comparison: Plain film of the chest 07/04/2012.  Findings:  Ventilation:  No focal abnormality is identified.  Radiotracer distribution is slightly heterogeneous, likely artifactual.  Perfusion:  No segmental or subsegmental defect is identified with mild heterogeneity noted.  IMPRESSION: Low probability for pulmonary embolus.   Original Report Authenticated By: Holley Dexter, M.D.     Brief H and P: For complete details please refer to admission H  and P, but in brief patient is a pleasant 78 y/o man with h/o CAD s/p CABG among other co-morbidities who presented to the hospital with complaints of SOB. We were asked to admit him for further evaluation and management.    Hospital Course:  Principal Problem:  *SOB (shortness of breath) Active Problems:  PROSTATE CANCER  HYPERLIPIDEMIA  ANXIETY  HEARING LOSS  HYPERTENSION  Fever  Cough  Shortness of breath  Edema  CAD, CABG X 5 '97. Low risk Myoview 2/12  Chronic renal disease, stage III, his SCr was up to 2.75 in November 2013 and diuretics held    SOB -Improved. -Likely multifactorial: mild diastolic CHF exacerbation, URI and probably an anginal equivalent component. -VQ was negative for PE. -Family and cardiology have decided to defer further cardiac workup at this venue. -He has had 3 CXRs this admission, all of which were negative for source of infection or edema. -He has cough with sputum production: likely viral, but at family's request have also provided a Z-pak.  ARF -A little worsened after lasix given. -Cr is 1.66 on DC up from 1.4 yesterday and 2.75 1 week prior to admission. -To follow up with his PCP and cardiologist for this issue.  Generalized Weakness -TSH/B12 ok. -To continue HH therapies at home.  Time spent on Discharge: Greater than 30 minutes.  SignedChaya Jan [facility] Pager: [phone] 07/07/2012, 11:36 AM

## 2012-07-08 LAB — CULTURE, RESPIRATORY W GRAM STAIN

## 2012-07-10 LAB — CULTURE, BLOOD (ROUTINE X 2): Culture: NO GROWTH

## 2012-07-12 ENCOUNTER — Ambulatory Visit (HOSPITAL_COMMUNITY): Payer: Medicare PPO

## 2012-07-12 ENCOUNTER — Encounter (HOSPITAL_COMMUNITY): Payer: Medicare PPO

## 2012-07-29 ENCOUNTER — Other Ambulatory Visit (HOSPITAL_COMMUNITY): Payer: Self-pay | Admitting: Cardiovascular Disease

## 2012-07-29 DIAGNOSIS — I2510 Atherosclerotic heart disease of native coronary artery without angina pectoris: Secondary | ICD-10-CM

## 2012-07-31 DIAGNOSIS — Z9289 Personal history of other medical treatment: Secondary | ICD-10-CM

## 2012-07-31 HISTORY — DX: Personal history of other medical treatment: Z92.89

## 2012-08-02 ENCOUNTER — Ambulatory Visit (HOSPITAL_COMMUNITY)
Admission: RE | Admit: 2012-08-02 | Discharge: 2012-08-02 | Disposition: A | Discharge status: Home / Self Care | Payer: Medicare PPO | Source: Ambulatory Visit | Attending: Cardiovascular Disease | Admitting: Cardiovascular Disease

## 2012-08-02 DIAGNOSIS — I2510 Atherosclerotic heart disease of native coronary artery without angina pectoris: Secondary | ICD-10-CM | POA: Insufficient documentation

## 2012-08-02 MED ORDER — TECHNETIUM TC 99M SESTAMIBI GENERIC - CARDIOLITE
30.3000 | Freq: Once | INTRAVENOUS | Status: AC | PRN
  Administered 2012-08-02: 30.3 via INTRAVENOUS

## 2012-08-02 MED ORDER — TECHNETIUM TC 99M SESTAMIBI GENERIC - CARDIOLITE
10.5000 | Freq: Once | INTRAVENOUS | Status: AC | PRN
  Administered 2012-08-02: 11 via INTRAVENOUS

## 2012-08-02 MED ORDER — REGADENOSON 0.4 MG/5ML IV SOLN
0.4000 mg | Freq: Once | INTRAVENOUS | Status: AC
  Administered 2012-08-02: 0.4 mg via INTRAVENOUS

## 2012-08-02 NOTE — Procedures (Addendum)
[facility] [address] [place] [address] [place] [address] [phone]  Cardiology Nuclear Med Study  Billy Morgan is a 79 y.o. male     MRN : [id number]     DOB: 18-Sep-1917  Procedure Date: 08/02/2012  Nuclear Med Background Indication for Stress Test:  Graft Patency History:  CABGx5 1997 Cardiac Risk Factors: Family History - CAD, Hypertension and Lipids  Symptoms:  Chest Pain, Dizziness and SOB   Nuclear Pre-Procedure Caffeine/Decaff Intake:  1:00am NPO After: 11:00am   IV Site: R Antecubital  IV 0.9% NS with Angio Cath:  22g  Chest Size (in):  42 IV Started by: Koren Shiver, CNMT  Height: 6\' 1"  (1.854 m)  Cup Size: n/a  BMI:  Body mass index is 24.28 kg/(m^2). Weight:  184 lb (83.462 kg)   Tech Comments:  n/a    Nuclear Med Study 1 or 2 day study: 1 day  Stress Test Type:  Lexiscan  Order Authorizing Provider:  Nicki Guadalajara, MD   Resting Radionuclide: Technetium 20m Sestamibi  Resting Radionuclide Dose: 10.5 mCi   Stress Radionuclide:  Technetium 32m Sestamibi  Stress Radionuclide Dose: 30.3 mCi           Stress Protocol Rest HR: 62 Stress HR: 83  Rest BP: 148/74 Stress BP: 124/61  Exercise Time (min): n/a METS: n/a          Dose of Adenosine (mg):  n/a Dose of Lexiscan: 0.4 mg  Dose of Atropine (mg): n/a Dose of Dobutamine: n/a mcg/kg/min (at max HR)  Stress Test Technologist: Ernestene Mention, CCT Nuclear Technologist: Koren Shiver, CNMT   Rest Procedure:  Myocardial perfusion imaging was performed at rest 45 minutes following the intravenous administration of Technetium 7m Sestamibi. Stress Procedure:  The patient received IV Lexiscan 0.4 mg over 15-seconds.  Technetium 34m Sestamibi injected at 30-seconds.  There were no significant changes with Lexiscan.  Quantitative spect images were obtained after a 45 minute delay.  Transient Ischemic Dilatation (Normal <1.22):  1.04 Lung/Heart Ratio (Normal <0.45):   0.32 QGS EDV:  69 ml QGS ESV:  23 ml LV Ejection Fraction: 67%  Signed by       Rest ECG: NSR - Normal EKG  Stress ECG: No significant change from baseline ECG  QPS Raw Data Images:  Normal; no motion artifact; normal heart/lung ratio. Stress Images:  Normal homogeneous uptake in all areas of the myocardium. Rest Images:  Normal homogeneous uptake in all areas of the myocardium. Subtraction (SDS):  No evidence of ischemia.  Impression Exercise Capacity:  Lexiscan with no exercise. BP Response:  Normal blood pressure response. Clinical Symptoms:  No significant symptoms noted. ECG Impression:  No significant ST segment change suggestive of ischemia. Comparison with Prior Nuclear Study: No significant change from previous study  Overall Impression:  Normal stress nuclear study.  LV Wall Motion:  NL LV Function; NL Wall Motion   Runell Gess, MD  08/02/2012 5:10 PM

## 2012-08-04 ENCOUNTER — Other Ambulatory Visit: Payer: Self-pay | Admitting: Pulmonary Disease

## 2012-08-09 ENCOUNTER — Other Ambulatory Visit: Payer: Self-pay | Admitting: Pulmonary Disease

## 2012-08-12 ENCOUNTER — Telehealth: Payer: Self-pay | Admitting: Pulmonary Disease

## 2012-08-12 MED ORDER — ALPRAZOLAM 0.25 MG PO TABS: 0.2500 mg | ORAL_TABLET | Freq: Every evening | ORAL | Status: DC | PRN

## 2012-08-12 NOTE — Telephone Encounter (Signed)
Called and spoke with the pharmacy and they stated that the pt has been getting #90 with the directions of 1/2 to 1 tid prn nerves.  They will get this filled for the pt and nothing further is needed.

## 2012-08-12 NOTE — Telephone Encounter (Signed)
Called and spoke with pts son Billy Morgan and he is aware of rx that has been called to the pharmacy for the alprazolam.  He also asked about a lift chair.  i advised to check with [facility] but the pts son stated that the pt did not want to deal with [facility].  He will check with a medical supply company and [facility] to see about getting the lift chair and to see if his medicare will cover any of the cost.  Billy Morgan will call me back is anything further is needed.

## 2012-09-09 ENCOUNTER — Inpatient Hospital Stay (HOSPITAL_COMMUNITY)
Admission: AD | Admit: 2012-09-09 | Discharge: 2012-09-13 | DRG: 292 | Disposition: A | Discharge status: Home / Self Care | Payer: Medicare HMO | Source: Ambulatory Visit | Attending: Cardiovascular Disease | Admitting: Cardiovascular Disease

## 2012-09-09 ENCOUNTER — Encounter (HOSPITAL_COMMUNITY): Payer: Self-pay | Admitting: Cardiology

## 2012-09-09 ENCOUNTER — Inpatient Hospital Stay (HOSPITAL_COMMUNITY): Payer: Medicare HMO

## 2012-09-09 DIAGNOSIS — Z7902 Long term (current) use of antithrombotics/antiplatelets: Secondary | ICD-10-CM

## 2012-09-09 DIAGNOSIS — N183 Chronic kidney disease, stage 3 unspecified: Secondary | ICD-10-CM | POA: Diagnosis present

## 2012-09-09 DIAGNOSIS — R601 Generalized edema: Secondary | ICD-10-CM

## 2012-09-09 DIAGNOSIS — Z951 Presence of aortocoronary bypass graft: Secondary | ICD-10-CM

## 2012-09-09 DIAGNOSIS — C61 Malignant neoplasm of prostate: Secondary | ICD-10-CM | POA: Diagnosis present

## 2012-09-09 DIAGNOSIS — I509 Heart failure, unspecified: Secondary | ICD-10-CM | POA: Diagnosis present

## 2012-09-09 DIAGNOSIS — H9190 Unspecified hearing loss, unspecified ear: Secondary | ICD-10-CM | POA: Diagnosis present

## 2012-09-09 DIAGNOSIS — M1990 Unspecified osteoarthritis, unspecified site: Secondary | ICD-10-CM | POA: Diagnosis present

## 2012-09-09 DIAGNOSIS — K589 Irritable bowel syndrome without diarrhea: Secondary | ICD-10-CM | POA: Diagnosis present

## 2012-09-09 DIAGNOSIS — K766 Portal hypertension: Secondary | ICD-10-CM | POA: Diagnosis present

## 2012-09-09 DIAGNOSIS — I739 Peripheral vascular disease, unspecified: Secondary | ICD-10-CM | POA: Diagnosis present

## 2012-09-09 DIAGNOSIS — Z7982 Long term (current) use of aspirin: Secondary | ICD-10-CM

## 2012-09-09 DIAGNOSIS — F0390 Unspecified dementia without behavioral disturbance: Secondary | ICD-10-CM | POA: Diagnosis present

## 2012-09-09 DIAGNOSIS — K7689 Other specified diseases of liver: Secondary | ICD-10-CM | POA: Diagnosis present

## 2012-09-09 DIAGNOSIS — I2510 Atherosclerotic heart disease of native coronary artery without angina pectoris: Secondary | ICD-10-CM | POA: Diagnosis present

## 2012-09-09 DIAGNOSIS — I5033 Acute on chronic diastolic (congestive) heart failure: Principal | ICD-10-CM | POA: Diagnosis present

## 2012-09-09 DIAGNOSIS — I129 Hypertensive chronic kidney disease with stage 1 through stage 4 chronic kidney disease, or unspecified chronic kidney disease: Secondary | ICD-10-CM | POA: Diagnosis present

## 2012-09-09 DIAGNOSIS — Z8546 Personal history of malignant neoplasm of prostate: Secondary | ICD-10-CM

## 2012-09-09 DIAGNOSIS — Z9861 Coronary angioplasty status: Secondary | ICD-10-CM

## 2012-09-09 DIAGNOSIS — Z79899 Other long term (current) drug therapy: Secondary | ICD-10-CM

## 2012-09-09 DIAGNOSIS — K219 Gastro-esophageal reflux disease without esophagitis: Secondary | ICD-10-CM | POA: Diagnosis present

## 2012-09-09 DIAGNOSIS — F411 Generalized anxiety disorder: Secondary | ICD-10-CM | POA: Diagnosis present

## 2012-09-09 DIAGNOSIS — E785 Hyperlipidemia, unspecified: Secondary | ICD-10-CM | POA: Diagnosis present

## 2012-09-09 HISTORY — DX: Generalized edema: R60.1

## 2012-09-09 LAB — COMPREHENSIVE METABOLIC PANEL
ALT: 16 U/L (ref 0–53)
AST: 32 U/L (ref 0–37)
Alkaline Phosphatase: 50 U/L (ref 39–117)
CO2: 29 mEq/L (ref 19–32)
Chloride: 98 mEq/L (ref 96–112)
Creatinine, Ser: 1.54 mg/dL — ABNORMAL HIGH (ref 0.50–1.35)
GFR calc non Af Amer: 37 mL/min — ABNORMAL LOW (ref >90)
Sodium: 139 mEq/L (ref 135–145)
Total Bilirubin: 0.4 mg/dL (ref 0.3–1.2)

## 2012-09-09 LAB — CBC WITH DIFFERENTIAL/PLATELET
Basophils Absolute: 0 10*3/uL (ref 0.0–0.1)
HCT: 40.8 % (ref 39.0–52.0)
Lymphocytes Relative: 21 % (ref 12–46)
Monocytes Absolute: 1 10*3/uL (ref 0.1–1.0)
Neutro Abs: 6.2 10*3/uL (ref 1.7–7.7)
RDW: 13.4 % (ref 11.5–15.5)
WBC: 9.2 10*3/uL (ref 4.0–10.5)

## 2012-09-09 LAB — TROPONIN I: Troponin I: <0.3 ng/mL (ref <0.30)

## 2012-09-09 MED ORDER — ASPIRIN 81 MG PO TABS: 81.0000 mg | ORAL_TABLET | Freq: Every day | ORAL | Status: DC

## 2012-09-09 MED ORDER — SODIUM CHLORIDE 0.9 % IJ SOLN: 3.0000 mL | INTRAMUSCULAR | Status: DC | PRN

## 2012-09-09 MED ORDER — ACETAMINOPHEN 325 MG PO TABS
650.0000 mg | ORAL_TABLET | ORAL | Status: DC | PRN
  Administered 2012-09-09 – 2012-09-10 (×2): 650 mg via ORAL
  Filled 2012-09-09 (×2): qty 2

## 2012-09-09 MED ORDER — FUROSEMIDE 10 MG/ML IJ SOLN
80.0000 mg | Freq: Two times a day (BID) | INTRAMUSCULAR | Status: DC
  Administered 2012-09-09 – 2012-09-10 (×2): 80 mg via INTRAVENOUS
  Filled 2012-09-09 (×5): qty 8

## 2012-09-09 MED ORDER — POTASSIUM CHLORIDE CRYS ER 10 MEQ PO TBCR
10.0000 meq | EXTENDED_RELEASE_TABLET | Freq: Once | ORAL | Status: AC
  Administered 2012-09-09: 10 meq via ORAL
  Filled 2012-09-09: qty 1

## 2012-09-09 MED ORDER — ASPIRIN EC 81 MG PO TBEC
81.0000 mg | DELAYED_RELEASE_TABLET | Freq: Every day | ORAL | Status: DC
  Administered 2012-09-10 – 2012-09-13 (×3): 81 mg via ORAL
  Filled 2012-09-09 (×5): qty 1

## 2012-09-09 MED ORDER — HYDRALAZINE HCL 25 MG PO TABS
25.0000 mg | ORAL_TABLET | Freq: Three times a day (TID) | ORAL | Status: DC
  Administered 2012-09-09 – 2012-09-13 (×12): 25 mg via ORAL
  Filled 2012-09-09 (×15): qty 1

## 2012-09-09 MED ORDER — ADULT MULTIVITAMIN W/MINERALS CH
1.0000 | ORAL_TABLET | Freq: Every day | ORAL | Status: DC
  Administered 2012-09-10 – 2012-09-13 (×4): 1 via ORAL
  Filled 2012-09-09 (×5): qty 1

## 2012-09-09 MED ORDER — NITROGLYCERIN 0.4 MG SL SUBL: 0.4000 mg | SUBLINGUAL_TABLET | SUBLINGUAL | Status: DC | PRN

## 2012-09-09 MED ORDER — PANTOPRAZOLE SODIUM 40 MG PO TBEC
40.0000 mg | DELAYED_RELEASE_TABLET | Freq: Every day | ORAL | Status: DC
  Administered 2012-09-10 – 2012-09-13 (×4): 40 mg via ORAL
  Filled 2012-09-09 (×4): qty 1

## 2012-09-09 MED ORDER — HEPARIN SODIUM (PORCINE) 5000 UNIT/ML IJ SOLN
5000.0000 [IU] | Freq: Three times a day (TID) | INTRAMUSCULAR | Status: DC
  Administered 2012-09-09 – 2012-09-13 (×12): 5000 [IU] via SUBCUTANEOUS
  Filled 2012-09-09 (×14): qty 1

## 2012-09-09 MED ORDER — KETOCONAZOLE 2 % EX CREA
1.0000 "application " | TOPICAL_CREAM | Freq: Every day | CUTANEOUS | Status: DC
  Administered 2012-09-10 – 2012-09-13 (×4): 1 via TOPICAL
  Filled 2012-09-09: qty 15

## 2012-09-09 MED ORDER — ATORVASTATIN CALCIUM 40 MG PO TABS
40.0000 mg | ORAL_TABLET | Freq: Every day | ORAL | Status: DC
  Administered 2012-09-09 – 2012-09-12 (×4): 40 mg via ORAL
  Filled 2012-09-09 (×5): qty 1

## 2012-09-09 MED ORDER — ALPRAZOLAM 0.25 MG PO TABS
0.2500 mg | ORAL_TABLET | Freq: Three times a day (TID) | ORAL | Status: DC | PRN
  Administered 2012-09-09: 0.25 mg via ORAL
  Filled 2012-09-09: qty 1

## 2012-09-09 MED ORDER — SODIUM CHLORIDE 0.9 % IJ SOLN
3.0000 mL | Freq: Two times a day (BID) | INTRAMUSCULAR | Status: DC
  Administered 2012-09-09 – 2012-09-13 (×7): 3 mL via INTRAVENOUS

## 2012-09-09 MED ORDER — ONDANSETRON HCL 4 MG/2ML IJ SOLN: 4.0000 mg | Freq: Four times a day (QID) | INTRAMUSCULAR | Status: DC | PRN

## 2012-09-09 MED ORDER — SODIUM CHLORIDE 0.9 % IV SOLN: 250.0000 mL | INTRAVENOUS | Status: DC | PRN

## 2012-09-09 MED ORDER — ISOSORBIDE MONONITRATE ER 60 MG PO TB24
90.0000 mg | ORAL_TABLET | Freq: Every day | ORAL | Status: DC
  Administered 2012-09-09 – 2012-09-13 (×5): 90 mg via ORAL
  Filled 2012-09-09 (×5): qty 1

## 2012-09-09 MED ORDER — POLYETHYLENE GLYCOL 3350 17 G PO PACK
17.0000 g | PACK | Freq: Every day | ORAL | Status: DC
  Administered 2012-09-10 – 2012-09-13 (×4): 17 g via ORAL
  Filled 2012-09-09 (×5): qty 1

## 2012-09-09 MED ORDER — METOPROLOL SUCCINATE ER 50 MG PO TB24
50.0000 mg | ORAL_TABLET | Freq: Every day | ORAL | Status: DC
  Administered 2012-09-09 – 2012-09-12 (×4): 50 mg via ORAL
  Filled 2012-09-09 (×5): qty 1

## 2012-09-09 NOTE — H&P (Signed)
Billy Morgan is an 79 y.o. male.    Cardiologist: Dr. Tresa Endo  Chief Complaint: SOB, orthopnea, DOE, enlarging abd.  HPI:  79 y.o. male followed by Dr Kriste Basque and Dr Tresa Endo with a past medical history significant for CAD. He had CABG X 5 in 1997. Myoview in Jan. 2014  was low risk. Echo in Dec. 2013 showed diastolic dysfunction, grade 1, EF 55-65%.  Mild LVH as well. Trivial MR.     He lives alone and has a caretaker. He was recently in [facility] ER 06/20/12 with SOB. His SCr had gone from 1.8 in July to 2.75 when he was seen in the ER at [facility]. Dr Tresa Endo saw him in th office 06/25/12. His diuretics were cut back and his ARB was stopped as well as his Diclofenac.   Since that time he had another admit for SOB with leukocytosis. Treated for diastolic HF and URI.  Cr. Stable at that time.    Today he called and asked to be seen for increasing edema and SOB,  Having to sit up to breathe at night. His abdomen has increased and he is also having more indigestion secondary to the enlarging abd.  In Dec. His torsemide had been increased to 40 mg, imdur increased and hydralizine added.     Today's wt up 11 pounds in 1 month.  His caregiver states his BP has been difficult to control and he at times has been dizzy.      Past Medical History  Diagnosis Date  . Unspecified hearing loss   . Unspecified essential hypertension   . Coronary atherosclerosis of unspecified type of vessel, native or graft   . Peripheral vascular disease, unspecified   . Unspecified venous (peripheral) insufficiency   . Other and unspecified hyperlipidemia   . Esophageal reflux   . Irritable bowel syndrome   . Unspecified disorder resulting from impaired renal function   . Osteoarthrosis, unspecified whether generalized or localized, unspecified site   . Memory loss   . Anxiety state, unspecified   . CHF (congestive heart failure)   . Malignant neoplasm of prostate   . Dementia   . Anasarca 09/09/2012  . Shortness of breath      Past Surgical History  Procedure Laterality Date  . Inguinal hernia repair    . Appendectomy    . Coronary artery bypass graft    . Coronary angioplasty with stent placement      Family History  Problem Relation Age of Onset  . Hypertension Mother   . Heart disease Father    Social History:  reports that he has never smoked. He has never used smokeless tobacco. He reports that he does not drink alcohol or use illicit drugs.Lives alone, has a care giver. 4 children, 4 grandchildren, 7 great grandchildren.  Allergies:  Allergies  Allergen Reactions  . Sulfamethoxazole     REACTION: unspecified   Out patient Medications:  Imdur 60 mg 1.5 tabs daily toprol XL 50 mg daily crestor  20 mg daily ASA 81 mg daily NTG sl prn Xanax 0.25 mg prn Fiber gummies daily MVI 2 daily Metamucil daily Torsemide 40 mg daily Hydralazine 25 mg TID KCL  10 meq daily ketoconazole cream apply to skin behind ears daily Tylenol arthritis 650 mg prn Prevacid 30 mg daily  Labs pending  ROS: General:no colds or fevers, + weight changes with 11 pound wt gain Skin:no rashes or ulcers HEENT:no blurred vision, no congestion, wears hearing aid CV:see HPI no  chest pain except indigestion improved with prevacid PUL:see HPI GI:no diarrhea constipation or melena, + indigestion since abd has enlarged GU:no hematuria, no dysuria MS:no joint pain, no claudication Neuro:no syncope, + lightheadedness Endo:no diabetes, no thyroid disease   BP: 122/66 P 75  Wt 193.7lbs  Previous 08/13/12 wt 182 lbs. Ht 6'1"   PE: General:alert and oriented, HOH, pleasant affect, DOE walking to exam room Skin:warm and dry brisk capillary refill HEENT:normocephalic, sclera clear Neck:supple, + JVD, + HJR, no carotid bruits Heart:S1S2 RRR, soft systolic murmur no gallup Lungs:rales in bases, no wheezes ZOX:[id number], anascarca, + BS, mild tenderness diffusely   Ext:1-2+ edema to hips though most below knees Neuro: alert  and oriented, MAE, follows commands  Assessment/Plan Principal Problem:   Acute on chronic diastolic HF (heart failure), EF 55-65% 12/13 with grade 1 diastolic dysfunction Active Problems:   SOB (shortness of breath)   Chronic renal disease, stage III, his SCr was up to 2.75 in November 2013 and diuretics held   HEARING LOSS   CAD, CABG X 5 '97. Low risk Myoview 1/14   Anasarca  PLAN: discussed with Dr. Herbie Baltimore , will admit, check labs concerning for acute renal failure with cr. Up to >2 in Nov last year.  No ace or ARB due to renal insufficiency.   Will hold torsemide and use Lasix instead.  Recent echo in 12/13.  Will check labs and adjust meds as needed.  Hopefully this will be 2 day hospitalization to reduce fluid and control BP.   INGOLD,LAURA R 09/09/2012, 4:57 PM    p

## 2012-09-09 NOTE — H&P (Signed)
I saw Mr. Mcclenton in the [facility] clinic this PM along with Ms. Annie Paras, NP.    79 y/o with CAD-CABG & long-standing diastolic dysfunction -- recently had diuretic doses decreased due to worsening renal function.  He now presents with significant wgt gain - PND/orthopnea & edema.  I agree with the exam -- + JVD & edema with increased abdominal girth/edema.  With his age & renal function, I feel that the safest plan is for short term admission for IV diuresis & BP control / medication adjustment.  He agrees to this plan.  Marykay Lex, M.D., M.S. [facility] [address] [place], [place]  [address]  [phone] Pager # [phone] 09/09/2012 5:58 PM

## 2012-09-10 LAB — BASIC METABOLIC PANEL
Chloride: 101 mEq/L (ref 96–112)
Creatinine, Ser: 1.71 mg/dL — ABNORMAL HIGH (ref 0.50–1.35)
GFR calc Af Amer: 38 mL/min — ABNORMAL LOW (ref >90)
Potassium: 4.3 mEq/L (ref 3.5–5.1)

## 2012-09-10 LAB — HEPATIC FUNCTION PANEL
Alkaline Phosphatase: 39 U/L (ref 39–117)
Bilirubin, Direct: 0.1 mg/dL (ref 0.0–0.3)
Indirect Bilirubin: 0.4 mg/dL (ref 0.3–0.9)
Total Bilirubin: 0.5 mg/dL (ref 0.3–1.2)

## 2012-09-10 LAB — TROPONIN I: Troponin I: <0.3 ng/mL (ref <0.30)

## 2012-09-10 LAB — TSH: TSH: 1.228 u[IU]/mL (ref 0.350–4.500)

## 2012-09-10 MED ORDER — FUROSEMIDE 10 MG/ML IJ SOLN
40.0000 mg | Freq: Two times a day (BID) | INTRAMUSCULAR | Status: DC
  Administered 2012-09-10 – 2012-09-11 (×2): 40 mg via INTRAVENOUS
  Filled 2012-09-10 (×2): qty 4

## 2012-09-10 NOTE — Progress Notes (Signed)
I have seen and evaluated the patient this AM along with Wilburt Finlay, PA. I agree with his findings, examination as well as impression recommendations.  Slow progress with diuresis.  Agree with backing off diuretic.   BP looks good.  RUQ discomfort may be related to congestive hepatopathy, but will order LFTs & RUQ [place].  Anticipate 1-2 more days of IV lasix then convert to PO.   Marykay Lex, M.D., M.S. [facility] [address] [place], [place]  [address]  [phone] Pager # [phone] 09/10/2012 11:30AM

## 2012-09-10 NOTE — Progress Notes (Signed)
[facility]  Subjective: Feeling better.  He was sleeping soundly when I entered.  His care giver was with him.  Objective: Vital signs in last 24 hours: Temp:  [98.2 F (36.8 C)-98.5 F (36.9 C)] 98.2 F (36.8 C) (02/11 0536) Pulse Rate:  [71-78] 71 (02/11 0536) Resp:  [18-20] 19 (02/11 0536) BP: (107-155)/(53-74) 107/53 mmHg (02/11 0536) SpO2:  [96 %-97 %] 96 % (02/11 0536) Weight:  [82.283 kg (181 lb 6.4 oz)-84.6 kg (186 lb 8.2 oz)] 82.283 kg (181 lb 6.4 oz) (02/11 0536) Last BM Date: 09/09/12  Intake/Output from previous day: 02/10 0701 - 02/11 0700 In: 540 [P.O.:540] Out: 2750 [Urine:2750] Intake/Output this shift: Total I/O In: 120 [P.O.:120] Out: -   Medications Current Facility-Administered Medications  Medication Dose Route Frequency Provider Last Rate Last Dose  . 0.9 %  sodium chloride infusion  250 mL Intravenous PRN Nada Boozer, NP      . acetaminophen (TYLENOL) tablet 650 mg  650 mg Oral Q4H PRN Nada Boozer, NP   650 mg at 09/09/12 2104  . ALPRAZolam Prudy Feeler) tablet 0.25 mg  0.25 mg Oral TID PRN Nada Boozer, NP   0.25 mg at 09/09/12 2104  . aspirin EC tablet 81 mg  81 mg Oral Daily Nada Boozer, NP      . atorvastatin (LIPITOR) tablet 40 mg  40 mg Oral q1800 Nada Boozer, NP   40 mg at 09/09/12 2050  . furosemide (LASIX) injection 80 mg  80 mg Intravenous Q12H Nada Boozer, NP   80 mg at 09/10/12 0545  . heparin injection 5,000 Units  5,000 Units Subcutaneous Q8H Nada Boozer, NP   5,000 Units at 09/10/12 0546  . hydrALAZINE (APRESOLINE) tablet 25 mg  25 mg Oral Q8H Nada Boozer, NP   25 mg at 09/10/12 0545  . isosorbide mononitrate (IMDUR) 24 hr tablet 90 mg  90 mg Oral Daily Nada Boozer, NP   90 mg at 09/09/12 2048  . ketoconazole (NIZORAL) 2 % cream 1 application  1 application Topical Daily Nada Boozer, NP      . metoprolol succinate (TOPROL-XL) 24 hr tablet 50 mg  50 mg Oral QHS Nada Boozer, NP   50 mg at 09/09/12 2049   . multivitamin with minerals tablet 1 tablet  1 tablet Oral Daily Nada Boozer, NP      . nitroGLYCERIN (NITROSTAT) SL tablet 0.4 mg  0.4 mg Sublingual Q5 min PRN Nada Boozer, NP      . ondansetron [facility]) injection 4 mg  4 mg Intravenous Q6H PRN Nada Boozer, NP      . pantoprazole (PROTONIX) EC tablet 40 mg  40 mg Oral Daily Nada Boozer, NP      . polyethylene glycol (MIRALAX / GLYCOLAX) packet 17 g  17 g Oral Daily Nada Boozer, NP      . sodium chloride 0.9 % injection 3 mL  3 mL Intravenous Q12H Nada Boozer, NP   3 mL at 09/09/12 2051  . sodium chloride 0.9 % injection 3 mL  3 mL Intravenous PRN Nada Boozer, NP        PE: General appearance: alert, cooperative and no distress Lungs: Mild basilar rales.  No wheeze or rhonchi. Heart: regular rate and rhythm, S1, S2 normal, no murmur, click, rub or gallop Extremities: Trace posterior tibial edema. Pulses: 2+ and symmetric Skin: Warm and dry. Neurologic: Grossly normal  Lab Results:   Recent Labs  09/09/12 1938  WBC 9.2  HGB 13.8  HCT 40.8  PLT 204   BMET  Recent Labs  09/09/12 1938 09/10/12 0445  NA 139 142  K 4.0 4.3  CL 98 101  CO2 29 33*  GLUCOSE 141* 105*  BUN 20 20  CREATININE 1.54* 1.71*  CALCIUM 9.2 9.1   PT/INR  Recent Labs  09/09/12 1938  LABPROT 13.6  INR 1.05   PORTABLE CHEST - 1 VIEW  Comparison: 07/06/2012  Findings: Prior CABG. Calcified pleural plaques noted along the  left hemidiaphragm and in the left lower chest. Bibasilar  scarring, similar to prior study. No acute opacities or effusions.  No acute bony abnormality. Heart is upper limits normal in size.  IMPRESSION:  No acute cardiopulmonary disease. No change.  Assessment/Plan  Principal Problem:   Acute on chronic diastolic HF (heart failure), EF 55-65% 12/13 with grade 1 diastolic dysfunction Active Problems:   PROSTATE CANCER   HEARING LOSS   SOB (shortness of breath)   CAD, CABG X 5 '97. Low risk Myoview 1/14    Chronic renal disease, stage III, his SCr was up to 2.75 in November 2013 and diuretics held   Anasarca  Plan:  He appears to be carrying all his extra fluid in his abdomen.  Net fluids -2.2 L with IV Lasix 80mg  BID.  Pro BNP is only 254.   Hydralazine 25mg  TID for afterload reduction.  BP controlled and stable.   Potassium WNL.  .  SCr increasing.  I will decrease lasix to 40mg  IV BID to avoid intravascular depletion.    LOS: 1 day    Ivyana Locey 09/10/2012 9:19 AM

## 2012-09-10 NOTE — Progress Notes (Signed)
Utilization Review Completed Maija Biggers J. Azka Steger, RN, BSN, NCM [phone]  

## 2012-09-11 ENCOUNTER — Inpatient Hospital Stay (HOSPITAL_COMMUNITY): Payer: Medicare HMO

## 2012-09-11 LAB — BASIC METABOLIC PANEL
Calcium: 9.2 mg/dL (ref 8.4–10.5)
GFR calc Af Amer: 39 mL/min — ABNORMAL LOW (ref >90)
GFR calc non Af Amer: 34 mL/min — ABNORMAL LOW (ref >90)
Potassium: 4 mEq/L (ref 3.5–5.1)
Sodium: 138 mEq/L (ref 135–145)

## 2012-09-11 MED ORDER — TORSEMIDE 20 MG PO TABS
40.0000 mg | ORAL_TABLET | Freq: Two times a day (BID) | ORAL | Status: DC
  Administered 2012-09-11 – 2012-09-13 (×4): 40 mg via ORAL
  Filled 2012-09-11 (×6): qty 2

## 2012-09-11 MED ORDER — BISACODYL 5 MG PO TBEC
10.0000 mg | DELAYED_RELEASE_TABLET | Freq: Once | ORAL | Status: AC
  Administered 2012-09-11: 10 mg via ORAL
  Filled 2012-09-11: qty 2

## 2012-09-11 MED ORDER — DOCUSATE SODIUM 100 MG PO CAPS
100.0000 mg | ORAL_CAPSULE | Freq: Every day | ORAL | Status: DC
  Administered 2012-09-12 – 2012-09-13 (×2): 100 mg via ORAL
  Filled 2012-09-11 (×2): qty 1

## 2012-09-11 NOTE — Plan of Care (Signed)
Problem: Phase I Progression Outcomes Goal: EF % per last Echo/documented,Core Reminder form on chart Outcome: Completed/Met Date Met:  09/11/12  55-65% per echo performed 07/04/12

## 2012-09-11 NOTE — Progress Notes (Signed)
[facility]  DAILY PROGRESS NOTE   Subjective:  Lasix decreased to 40 mg IV BID yesterday. No events overnight. Net 3.8L negative, creatinine is improving. Abdominal x-ray shows moderate amount of stool. Ultrasound was unremarkable other than questionable hepatic steatosis.   Objective:  Temp:  [97.7 F (36.5 C)-97.9 F (36.6 C)] 97.9 F (36.6 C) (02/12 0904) Pulse Rate:  [68-74] 68 (02/12 0904) Resp:  [20] 20 (02/12 0904) BP: (109-149)/(58-72) 137/63 mmHg (02/12 0910) SpO2:  [95 %-98 %] 95 % (02/12 0904) Weight:  [82.3 kg (181 lb 7 oz)] 82.3 kg (181 lb 7 oz) (02/12 0510) Weight change: -2.3 kg (-5 lb 1.1 oz)  Intake/Output from previous day: 02/11 0701 - 02/12 0700 In: 480 [P.O.:480] Out: 1375 [Urine:1375]  Intake/Output from this shift: Total I/O In: 240 [P.O.:240] Out: 950 [Urine:950]  Medications: Current Facility-Administered Medications  Medication Dose Route Frequency Provider Last Rate Last Dose  . 0.9 %  sodium chloride infusion  250 mL Intravenous PRN Nada Boozer, NP      . acetaminophen (TYLENOL) tablet 650 mg  650 mg Oral Q4H PRN Nada Boozer, NP   650 mg at 09/10/12 1114  . ALPRAZolam Prudy Feeler) tablet 0.25 mg  0.25 mg Oral TID PRN Nada Boozer, NP   0.25 mg at 09/09/12 2104  . aspirin EC tablet 81 mg  81 mg Oral Daily Nada Boozer, NP   81 mg at 09/11/12 0908  . atorvastatin (LIPITOR) tablet 40 mg  40 mg Oral q1800 Nada Boozer, NP   40 mg at 09/10/12 1749  . furosemide (LASIX) injection 40 mg  40 mg Intravenous Q12H Wilburt Finlay, PA   40 mg at 09/11/12 0801  . heparin injection 5,000 Units  5,000 Units Subcutaneous Q8H Nada Boozer, NP   5,000 Units at 09/11/12 0610  . hydrALAZINE (APRESOLINE) tablet 25 mg  25 mg Oral Q8H Nada Boozer, NP   25 mg at 09/11/12 0610  . isosorbide mononitrate (IMDUR) 24 hr tablet 90 mg  90 mg Oral Daily Nada Boozer, NP   90 mg at 09/11/12 0907  . ketoconazole (NIZORAL) 2 % cream 1 application  1  application Topical Daily Nada Boozer, NP   1 application at 09/11/12 [phone]  . metoprolol succinate (TOPROL-XL) 24 hr tablet 50 mg  50 mg Oral QHS Nada Boozer, NP   50 mg at 09/10/12 2220  . multivitamin with minerals tablet 1 tablet  1 tablet Oral Daily Nada Boozer, NP   1 tablet at 09/11/12 0908  . nitroGLYCERIN (NITROSTAT) SL tablet 0.4 mg  0.4 mg Sublingual Q5 min PRN Nada Boozer, NP      . ondansetron [facility]) injection 4 mg  4 mg Intravenous Q6H PRN Nada Boozer, NP      . pantoprazole (PROTONIX) EC tablet 40 mg  40 mg Oral Daily Nada Boozer, NP   40 mg at 09/11/12 0908  . polyethylene glycol (MIRALAX / GLYCOLAX) packet 17 g  17 g Oral Daily Nada Boozer, NP   17 g at 09/11/12 0907  . sodium chloride 0.9 % injection 3 mL  3 mL Intravenous Q12H Nada Boozer, NP   3 mL at 09/10/12 2220  . sodium chloride 0.9 % injection 3 mL  3 mL Intravenous PRN Nada Boozer, NP        Physical Exam: General appearance: alert and no distress Neck: no adenopathy, no carotid bruit, no JVD, supple, symmetrical, trachea midline and thyroid not enlarged, symmetric, no tenderness/mass/nodules Lungs:  clear to auscultation bilaterally Heart: regular rate and rhythm, S1, S2 normal, no murmur, click, rub or gallop Abdomen: distended, doughy, no fluid wave Extremities: extremities normal, atraumatic, no cyanosis or edema Pulses: 2+ and symmetric Skin: Skin color, texture, turgor normal. No rashes or lesions Neurologic: Grossly normal  Lab Results: Results for orders placed during the hospital encounter of 09/09/12 (from the past 48 hour(s))  CBC WITH DIFFERENTIAL     Status: None   Collection Time    09/09/12  7:38 PM      Result Value Range   WBC 9.2  4.0 - 10.5 K/uL   RBC 4.25  4.22 - 5.81 MIL/uL   Hemoglobin 13.8  13.0 - 17.0 g/dL   HCT 16.1  09.6 - 04.5 %   MCV 96.0  78.0 - 100.0 fL   MCH 32.5  26.0 - 34.0 pg   MCHC 33.8  30.0 - 36.0 g/dL   RDW 40.9  81.1 - 91.4 %   Platelets 204  150 - 400 K/uL    Neutrophils Relative 67  43 - 77 %   Neutro Abs 6.2  1.7 - 7.7 K/uL   Lymphocytes Relative 21  12 - 46 %   Lymphs Abs 1.9  0.7 - 4.0 K/uL   Monocytes Relative 11  3 - 12 %   Monocytes Absolute 1.0  0.1 - 1.0 K/uL   Eosinophils Relative 2  0 - 5 %   Eosinophils Absolute 0.2  0.0 - 0.7 K/uL   Basophils Relative 0  0 - 1 %   Basophils Absolute 0.0  0.0 - 0.1 K/uL  COMPREHENSIVE METABOLIC PANEL     Status: Abnormal   Collection Time    09/09/12  7:38 PM      Result Value Range   Sodium 139  135 - 145 mEq/L   Potassium 4.0  3.5 - 5.1 mEq/L   Chloride 98  96 - 112 mEq/L   CO2 29  19 - 32 mEq/L   Glucose, Bld 141 (*) 70 - 99 mg/dL   BUN 20  6 - 23 mg/dL   Creatinine, Ser 7.82 (*) 0.50 - 1.35 mg/dL   Calcium 9.2  8.4 - 95.6 mg/dL   Total Protein 7.2  6.0 - 8.3 g/dL   Albumin 3.6  3.5 - 5.2 g/dL   AST 32  0 - 37 U/L   ALT 16  0 - 53 U/L   Alkaline Phosphatase 50  39 - 117 U/L   Total Bilirubin 0.4  0.3 - 1.2 mg/dL   GFR calc non Af Amer 37 (*) >90 mL/min   GFR calc Af Amer 43 (*) >90 mL/min   Comment:            The eGFR has been calculated     using the CKD EPI equation.     This calculation has not been     validated in all clinical     situations.     eGFR's persistently     <90 mL/min signify     possible Chronic Kidney Disease.  APTT     Status: None   Collection Time    09/09/12  7:38 PM      Result Value Range   aPTT 32  24 - 37 seconds  PROTIME-INR     Status: None   Collection Time    09/09/12  7:38 PM      Result Value Range   Prothrombin Time 13.6  11.6 -  15.2 seconds   INR 1.05  0.00 - 1.49  MAGNESIUM     Status: None   Collection Time    09/09/12  7:38 PM      Result Value Range   Magnesium 2.2  1.5 - 2.5 mg/dL  TSH     Status: None   Collection Time    09/09/12  7:38 PM      Result Value Range   TSH 1.228  0.350 - 4.500 uIU/mL  PRO B NATRIURETIC PEPTIDE     Status: None   Collection Time    09/09/12  7:39 PM      Result Value Range   Pro B  Natriuretic peptide (BNP) 247.5  0 - 450 pg/mL  TROPONIN I     Status: None   Collection Time    09/09/12  7:39 PM      Result Value Range   Troponin I <0.30  <0.30 ng/mL   Comment:            Due to the release kinetics of cTnI,     a negative result within the first hours     of the onset of symptoms does not rule out     myocardial infarction with certainty.     If myocardial infarction is still suspected,     repeat the test at appropriate intervals.  TROPONIN I     Status: None   Collection Time    09/10/12 12:25 AM      Result Value Range   Troponin I <0.30  <0.30 ng/mL   Comment:            Due to the release kinetics of cTnI,     a negative result within the first hours     of the onset of symptoms does not rule out     myocardial infarction with certainty.     If myocardial infarction is still suspected,     repeat the test at appropriate intervals.  BASIC METABOLIC PANEL     Status: Abnormal   Collection Time    09/10/12  4:45 AM      Result Value Range   Sodium 142  135 - 145 mEq/L   Potassium 4.3  3.5 - 5.1 mEq/L   Chloride 101  96 - 112 mEq/L   CO2 33 (*) 19 - 32 mEq/L   Glucose, Bld 105 (*) 70 - 99 mg/dL   BUN 20  6 - 23 mg/dL   Creatinine, Ser 1.61 (*) 0.50 - 1.35 mg/dL   Calcium 9.1  8.4 - 09.6 mg/dL   GFR calc non Af Amer 33 (*) >90 mL/min   GFR calc Af Amer 38 (*) >90 mL/min   Comment:            The eGFR has been calculated     using the CKD EPI equation.     This calculation has not been     validated in all clinical     situations.     eGFR's persistently     <90 mL/min signify     possible Chronic Kidney Disease.  HEPATIC FUNCTION PANEL     Status: Abnormal   Collection Time    09/10/12  4:45 AM      Result Value Range   Total Protein 6.5  6.0 - 8.3 g/dL   Albumin 3.4 (*) 3.5 - 5.2 g/dL   AST 27  0 - 37 U/L   ALT 14  0 - 53 U/L   Alkaline Phosphatase 39  39 - 117 U/L   Total Bilirubin 0.5  0.3 - 1.2 mg/dL   Bilirubin, Direct 0.1  0.0 -  0.3 mg/dL   Indirect Bilirubin 0.4  0.3 - 0.9 mg/dL  BASIC METABOLIC PANEL     Status: Abnormal   Collection Time    09/11/12  5:55 AM      Result Value Range   Sodium 138  135 - 145 mEq/L   Potassium 4.0  3.5 - 5.1 mEq/L   Chloride 97  96 - 112 mEq/L   CO2 32  19 - 32 mEq/L   Glucose, Bld 103 (*) 70 - 99 mg/dL   BUN 21  6 - 23 mg/dL   Creatinine, Ser 4.78 (*) 0.50 - 1.35 mg/dL   Calcium 9.2  8.4 - 29.5 mg/dL   GFR calc non Af Amer 34 (*) >90 mL/min   GFR calc Af Amer 39 (*) >90 mL/min   Comment:            The eGFR has been calculated     using the CKD EPI equation.     This calculation has not been     validated in all clinical     situations.     eGFR's persistently     <90 mL/min signify     possible Chronic Kidney Disease.    Imaging: Dg Abd 1 View  09/09/2012  *RADIOLOGY REPORT*  Clinical Data: Anasarca.  ABDOMEN - 1 VIEW  Comparison: CT 07/03/2012  Findings: A nonobstructive bowel gas pattern.  Gas within nondistended large and small bowel.  Moderate stool burden throughout the colon.  No supine evidence of free air.  No suspicious calcifications organomegaly.  Scoliosis and degenerative changes in the lumbar spine.  IMPRESSION: No acute findings.  Moderate stool burden.   Original Report Authenticated By: Charlett Nose, M.D.    US Abdomen Complete  09/11/2012  *RADIOLOGY REPORT*  Clinical Data:  Right upper quadrant discomfort.  COMPLETE ABDOMINAL ULTRASOUND  Comparison:  None.  Findings:  Gallbladder:  A 3 mm polyp is seen along the gallbladder wall.  No gallstones, wall thickening or sonographic Murphy's sign.  Common bile duct:  Measures 4 mm, within normal limits.  Liver:  Parenchymal echo texture appears mildly heterogeneous.  No definite focal lesions.  IVC:  Appears normal.  Pancreas:  Somewhat obscured by bowel gas.  Spleen:  Measures 6.5 cm, negative.  Right Kidney:  Measures 9.60 meters.  Parenchymal echogenicity is normal.  No hydronephrosis.  No focal lesions.  Left  Kidney:  Measures 10.5 cm.  Parenchymal echogenicity is normal.  No hydronephrosis.  No focal lesions.  Abdominal aorta:  Atherosclerotic calcification is seen along the wall of the aorta.  No aneurysm.  IMPRESSION:  1.  No acute findings. 2.  Question hepatic steatosis.   Original Report Authenticated By: Leanna Battles, M.D.    Dg Chest Port 1 View  09/09/2012  *RADIOLOGY REPORT*  Clinical Data: CHF, shortness of breath.  PORTABLE CHEST - 1 VIEW  Comparison: 07/06/2012  Findings: Prior CABG.  Calcified pleural plaques noted along the left hemidiaphragm and in the left lower chest.  Bibasilar scarring, similar to prior study.  No acute opacities or effusions. No acute bony abnormality.  Heart is upper limits normal in size.  IMPRESSION: No acute cardiopulmonary disease.  No change.   Original Report Authenticated By: Charlett Nose, M.D.     Assessment:  1. Principal  Problem: 2.   Acute on chronic diastolic HF (heart failure), EF 55-65% 12/13 with grade 1 diastolic dysfunction 3. Active Problems: 4.   PROSTATE CANCER 5.   HEARING LOSS 6.   SOB (shortness of breath) 7.   CAD, CABG X 5 '97. Low risk Myoview 1/14 8.   Chronic renal disease, stage III, his SCr was up to 2.75 in November 2013 and diuretics held 9.   Anasarca 10.   Plan:  1. Seems to be feeling better .Marland Kitchen I think this is a combination of diastolic heart failure, mild renal failure and hepatic steatosis with probable portal hypertension. He also has some stool burden. Will Rx for a bowel cathartic today. Switch from IV lasix to po torsemide BID. Consider adding a potassium sparing diuretic as well.  Probably close to discharge in a few days.  Daughter is concerned about re-admission, since he has had 2 admits in 6 months. We need to make sure we can get him on the right diuretic combination before discharge and identify any risks for re-admission.  Time Spent Directly with Patient:  15 minutes  Length of Stay:  LOS: 2 days    Chrystie Nose, MD, [facility] Attending Cardiologist [facility]  Genesys Coggeshall C 09/11/2012, 1:52 PM

## 2012-09-12 LAB — BASIC METABOLIC PANEL
Chloride: 96 mEq/L (ref 96–112)
GFR calc Af Amer: 40 mL/min — ABNORMAL LOW (ref >90)
GFR calc non Af Amer: 35 mL/min — ABNORMAL LOW (ref >90)
Potassium: 3.5 mEq/L (ref 3.5–5.1)

## 2012-09-12 LAB — HEPATIC FUNCTION PANEL
AST: 29 U/L (ref 0–37)
Albumin: 3.6 g/dL (ref 3.5–5.2)
Bilirubin, Direct: 0.1 mg/dL (ref 0.0–0.3)
Total Bilirubin: 0.7 mg/dL (ref 0.3–1.2)

## 2012-09-12 LAB — LIPASE, BLOOD: Lipase: 25 U/L (ref 11–59)

## 2012-09-12 MED ORDER — ASPIRIN 81 MG PO CHEW
CHEWABLE_TABLET | ORAL | Status: AC
  Administered 2012-09-12: 10:00:00
  Filled 2012-09-12: qty 1

## 2012-09-12 NOTE — Progress Notes (Signed)
[facility]  Subjective: Doing well.  Only complaint is some right LE pain.  "touchy".  Small BM last night.  Objective: Vital signs in last 24 hours: Temp:  [97.5 F (36.4 C)-98 F (36.7 C)] 98 F (36.7 C) (02/13 0514) Pulse Rate:  [72-77] 72 (02/13 0514) Resp:  [18-20] 18 (02/13 0514) BP: (109-154)/(49-77) 144/71 mmHg (02/13 0514) SpO2:  [97 %] 97 % (02/13 0514) Weight:  [80.831 kg (178 lb 3.2 oz)] 80.831 kg (178 lb 3.2 oz) (02/13 0514) Last BM Date: 09/09/12  Intake/Output from previous day: 02/12 0701 - 02/13 0700 In: 720 [P.O.:720] Out: 2250 [Urine:2250] Intake/Output this shift: Total I/O In: 240 [P.O.:240] Out: 200 [Urine:200]  Medications Current Facility-Administered Medications  Medication Dose Route Frequency Provider Last Rate Last Dose  . 0.9 %  sodium chloride infusion  250 mL Intravenous PRN Nada Boozer, NP      . acetaminophen (TYLENOL) tablet 650 mg  650 mg Oral Q4H PRN Nada Boozer, NP   650 mg at 09/10/12 1114  . ALPRAZolam Prudy Feeler) tablet 0.25 mg  0.25 mg Oral TID PRN Nada Boozer, NP   0.25 mg at 09/09/12 2104  . aspirin EC tablet 81 mg  81 mg Oral Daily Nada Boozer, NP   81 mg at 09/11/12 0908  . atorvastatin (LIPITOR) tablet 40 mg  40 mg Oral q1800 Nada Boozer, NP   40 mg at 09/11/12 1826  . docusate sodium (COLACE) capsule 100 mg  100 mg Oral Daily Chrystie Nose, MD      . heparin injection 5,000 Units  5,000 Units Subcutaneous Q8H Nada Boozer, NP   5,000 Units at 09/12/12 [phone]  . hydrALAZINE (APRESOLINE) tablet 25 mg  25 mg Oral Q8H Nada Boozer, NP   25 mg at 09/12/12 0640  . isosorbide mononitrate (IMDUR) 24 hr tablet 90 mg  90 mg Oral Daily Nada Boozer, NP   90 mg at 09/11/12 0907  . ketoconazole (NIZORAL) 2 % cream 1 application  1 application Topical Daily Nada Boozer, NP   1 application at 09/11/12 [phone]  . metoprolol succinate (TOPROL-XL) 24 hr tablet 50 mg  50 mg Oral QHS Nada Boozer, NP   50 mg at  09/11/12 2255  . multivitamin with minerals tablet 1 tablet  1 tablet Oral Daily Nada Boozer, NP   1 tablet at 09/11/12 0908  . nitroGLYCERIN (NITROSTAT) SL tablet 0.4 mg  0.4 mg Sublingual Q5 min PRN Nada Boozer, NP      . ondansetron [facility]) injection 4 mg  4 mg Intravenous Q6H PRN Nada Boozer, NP      . pantoprazole (PROTONIX) EC tablet 40 mg  40 mg Oral Daily Nada Boozer, NP   40 mg at 09/11/12 0908  . polyethylene glycol (MIRALAX / GLYCOLAX) packet 17 g  17 g Oral Daily Nada Boozer, NP   17 g at 09/11/12 0907  . sodium chloride 0.9 % injection 3 mL  3 mL Intravenous Q12H Nada Boozer, NP   3 mL at 09/11/12 2255  . sodium chloride 0.9 % injection 3 mL  3 mL Intravenous PRN Nada Boozer, NP      . torsemide [facility]) tablet 40 mg  40 mg Oral BID Chrystie Nose, MD   40 mg at 09/12/12 0855    PE: General appearance: alert, cooperative and no distress Neck: no JVD Lungs: clear to auscultation bilaterally Heart: regular rate and rhythm Abdomen: +BS.  Nontender.  + distention but not  as tight as yesterday. Extremities: No LEE Pulses: 2+ and symmetric Skin: Warm and dry Neurologic: Grossly normal  Lab Results:   Recent Labs  09/09/12 1938  WBC 9.2  HGB 13.8  HCT 40.8  PLT 204   BMET  Recent Labs  09/10/12 0445 09/11/12 0555 09/12/12 0615  NA 142 138 138  K 4.3 4.0 3.5  CL 101 97 96  CO2 33* 32 31  GLUCOSE 105* 103* 114*  BUN 20 21 22   CREATININE 1.71* 1.68* 1.64*  CALCIUM 9.1 9.2 9.5   PT/INR  Recent Labs  09/09/12 1938  LABPROT 13.6  INR 1.05  COMPLETE ABDOMINAL ULTRASOUND  Comparison: None.  Findings:  Gallbladder: A 3 mm polyp is seen along the gallbladder wall. No  gallstones, wall thickening or sonographic Murphy's sign.  Common bile duct: Measures 4 mm, within normal limits.  Liver: Parenchymal echo texture appears mildly heterogeneous. No  definite focal lesions.  IVC: Appears normal.  Pancreas: Somewhat obscured by bowel gas.  Spleen:  Measures 6.5 cm, negative.  Right Kidney: Measures 9.60 meters. Parenchymal echogenicity is  normal. No hydronephrosis. No focal lesions.  Left Kidney: Measures 10.5 cm. Parenchymal echogenicity is  normal. No hydronephrosis. No focal lesions.  Abdominal aorta: Atherosclerotic calcification is seen along the  wall of the aorta. No aneurysm.  IMPRESSION:  1. No acute findings.  2. Question hepatic steatosis.   Assessment/Plan  Principal Problem:   Acute on chronic diastolic HF (heart failure), EF 55-65% 12/13 with grade 1 diastolic dysfunction Active Problems:   PROSTATE CANCER   HEARING LOSS   SOB (shortness of breath)   CAD, CABG X 5 '97. Low risk Myoview 1/14   Chronic renal disease, stage III, his SCr was up to 2.75 in November 2013 and diuretics held   Anasarca  Plan:   1.  Changed to PO bumex yesterday and net fluids(24/48hrs): -1.5L/-2.4L.   SCr. Stable.  2.  BP and HR stable.     3.  Right LE pain.  Has had previously, but bothering him more today.  No edema.  I do not think a DVT is likely.  On SQ heparin.   4.  Had BM yesterday.   Getting miralax.  Abd [place]:  No acute findings ? Hepatic steatosis.  Moderate stool on plain film.  Abdomen is not as tight as the other day.   5.  Continue current therapy and likely DC home tomorrow. 6.  PT eval   LOS: 3 days    HAGER, BRYAN 09/12/2012 9:09 AM   Patient seen and examined. Agree with assessment and plan. Good diuresis; now on oral diuretic regimen. Abdomen is still distended, but less. Will check LFT's, amylase and lipase. Possible DC tomorrow.   Lennette Bihari, MD, [facility] 09/12/2012 10:07 AM

## 2012-09-12 NOTE — Evaluation (Signed)
Physical Therapy Evaluation Patient Details Name: BRAISON SNOKE MRN: [id number] DOB: 06-10-1918 Today's Date: 09/12/2012 Time: 8657-8469 PT Time Calculation (min): 43 min  PT Assessment / Plan / Recommendation Clinical Impression  Pt admitted with CHF exacerbation. Lengthy discussion with pt and caregiver regarding home environment, manipulation of environment, home safety and PLOF. Pt has caregiver 10hrs a day generally from 9a-2p, 6p-10p but will stay longer as needed if pt appears to need help that day. Pt has all necessary DME and family open to increased assist during 2-6p if needed but that with pt normal routine is moving around in the morning and fatigued and napping by the afternoon when caregiver leaves. Pt has RW but performs better with cane per caregiver and hasn't had any falls since she  changed her hours to cover more of the day whereas prior she was only there 3days/wk and he had 46falls. Pt very HOH and has urgent need to void if not cued to do so more frequently. At this time pt at baseline with all necessary arrangements made and caregiver very knowledgeable to pt function and safety awareness for home moblity. Discussed need for increased care if falls or weakness develop, HEP for bil LE and PT role. No further acute needs at this time and encoruaged daily ambulation with caregiver or staff. Pt placed on chair alarm due to in bathroom unassisted on arrival with RN aware.     PT Assessment  Patent does not need any further PT services    Follow Up Recommendations  No PT follow up    Does the patient have the potential to tolerate intense rehabilitation      Barriers to Discharge        Equipment Recommendations  None recommended by PT    Recommendations for Other Services     Frequency      Precautions / Restrictions Precautions Precautions: Fall Restrictions Weight Bearing Restrictions: No   Pertinent Vitals/Pain No pain sats 92% on RA with ambulation HR  87-106 BP 116/67 end of ambulation      Mobility  Bed Mobility Bed Mobility: Not assessed Transfers Transfers: Sit to Stand;Stand to Sit Sit to Stand: 5: Supervision;From toilet;From chair/3-in-1 Stand to Sit: 5: Supervision;To chair/3-in-1 Details for Transfer Assistance: supervision for safety Ambulation/Gait Ambulation/Gait Assistance: 5: Supervision Ambulation Distance (Feet): 350 Feet Assistive device: Straight cane Ambulation/Gait Assistance Details: cueing for directional cues and direction to room, pt fatigued last 50' and caregiver states he normally wouldn't walk that far at one time but won't notify strangers of fatigue but will let family and caregiver know Gait Pattern: Step-through pattern;Decreased stride length;Trunk flexed Gait velocity: quick gait Stairs: Yes Stairs Assistance: 4: Min guard Stair Management Technique: One rail Right;Forwards;Step to pattern Number of Stairs: 5    Exercises     PT Diagnosis:    PT Problem List:   PT Treatment Interventions:     PT Goals    Visit Information  Last PT Received On: 09/12/12 Assistance Needed: +1    Subjective Data  Subjective: "I couldn't hold it I had to go"- referring to getting up to bathroom unassisted   Prior Functioning  Home Living Lives With: Alone Available Help at Discharge: Personal care attendant;Available PRN/intermittently (10hrs /day) Type of Home: House Home Access: Ramped entrance;Stairs to enter Entrance Stairs-Number of Steps: 5 steps with rail when he goes out with caregiver but if he goes out alone then he uses ramp Entrance Stairs-Rails: Right Home Layout: One level Bathroom  Shower/Tub: Health visitor: Standard Home Adaptive Equipment: Grab bars around toilet;Grab bars in shower;Raised toilet seat with rails;Walker - rolling;Straight cane;Wheelchair - manual;Hand-held shower hose Prior Function Level of Independence: Needs assistance Needs Assistance: Meal  Prep;Light Housekeeping Meal Prep: Total Light Housekeeping: Total Able to Take Stairs?: Yes Driving: No Vocation: Retired Musician: Surveyor, mining Overall Cognitive Status: History of cognitive impairments - at baseline Arousal/Alertness: Awake/alert Orientation Level: Disoriented to;Situation Behavior During Session: [facility] for tasks performed    Extremity/Trunk Assessment Right Upper Extremity Assessment RUE ROM/Strength/Tone: [facility] for tasks assessed Left Upper Extremity Assessment LUE ROM/Strength/Tone: WFL for tasks assessed Right Lower Extremity Assessment RLE ROM/Strength/Tone: [facility] for tasks assessed Left Lower Extremity Assessment LLE ROM/Strength/Tone: WFL for tasks assessed Trunk Assessment Trunk Assessment: Kyphotic   Balance    End of Session PT - End of Session Activity Tolerance: Patient tolerated treatment well Patient left: in chair;with call bell/phone within reach;with family/visitor present Nurse Communication: Mobility status  GP     Delorse Lek 09/12/2012, 12:56 PM Delaney Meigs, PT [phone]

## 2012-09-13 MED ORDER — TORSEMIDE 20 MG PO TABS: 40.0000 mg | ORAL_TABLET | Freq: Two times a day (BID) | ORAL | Status: DC

## 2012-09-13 NOTE — Discharge Summary (Signed)
Physician Discharge Summary  Patient ID: Billy Morgan MRN: [id number] DOB/AGE: May 16, 1918 79 y.o.  Admit date: 09/09/2012 Discharge date: 09/13/2012  Admission Diagnoses: Acute on Chronic Diastolic HF  Discharge Diagnoses:  Principal Problem:   Acute on chronic diastolic HF (heart failure), EF 55-65% 12/13 with grade 1 diastolic dysfunction Active Problems:   PROSTATE CANCER   HEARING LOSS   SOB (shortness of breath)   CAD, CABG X 5 '97. Low risk Myoview 1/14   Chronic renal disease, stage III, his SCr was up to 2.75 in November 2013 and diuretics held   Anasarca   Discharged Condition: stable  Hospital Course: The patient is a 79 y/o caucasian male, who has been followed by Dr. Tresa Endo, with a history of CAD (CABG x5 in 1997), as well as long standing diastolic dysfunction. He also has chronic renal disease, and recently had his diuretic dose decreased, by Dr. Tresa Endo in 11/13, due to worsening renal function (increase in baseline Cr >2). He initially presented to [facility] on 09/09/12, with complaints of increasing SOB, along with PND/orthopnea, weight gain and edema. He was seen and evaluated in the office by Nada Boozer, FNP, and Dr. Bryan Lemma. On exam, he had JVD, significant LEE and increased abdominal girth/edema. He was admitted directly to [facility] for IV diuresis and medication adjustment. His creatinine, at time of admission, was 1.54. His admission weight was 193.7 lbs. Pro BNP was 247.5.  His home diuretic, torsemide, was held and he was placed on IV Lasix, 80 mg BID. He had good diuresis, however he had serial increases in his creatinine.  Lasix was decreased to 40 mg BID, then later switched back to PO torsemide. He had resoution of symptoms (SOB and edema), however, endorsed right upper quadrant discomfort. An abdominal ultrasound demonstrated no acute findings, but did suggest hepatic steatosis. A hepatic function panel and lipase were obtained, both of which were within normal limits.  His abdominal discomfort resolved a day later. By hospital day #4, he was euvolemic, his creatinine had stabilized (~1.64) and his weight had decreased ~ 18 lbs, since time of admission. He was last seen and examined by Dr. Rennis Golden, who felt that he was stable for discharge home. OP follow up at [facility] will be arranged in 5-7 days with a PA/NP. He is ordered to obtain both a BNP and BMP prior to office visit. He is to continue with 40 mg of torsemide BID.  Discharge weight: 176 lb 4.8 oz (79.969 kg)  Consults: None  Significant Diagnostic Studies: Abdominal ultrasound  Clinical Data: Right upper quadrant discomfort IMPRESSION:  1. No acure findings  2. Question hepatic steatosis  Treatments: See Hospital Course  Discharge Exam: Blood pressure 138/70, pulse 86, temperature 97.7 F (36.5 C), temperature source Oral, resp. rate 18, height 6' (1.829 m), weight 176 lb 4.8 oz (79.969 kg), SpO2 98.00%.   Disposition: 01-Home or Self Care  Discharge Orders   Future Orders Complete By Expires     Diet - low sodium heart healthy  As directed     Increase activity slowly  As directed         Medication List    TAKE these medications       acetaminophen 650 MG CR tablet  Commonly known as:  TYLENOL  Take 650 mg by mouth every 8 (eight) hours as needed for pain (for pain).     ALPRAZolam 0.25 MG tablet  Commonly known as:  XANAX  Take 0.25 mg by mouth  at bedtime.     aspirin 81 MG tablet  Take 81 mg by mouth daily.     clopidogrel 75 MG tablet  Commonly known as:  PLAVIX  Take 75 mg by mouth daily.     hydrALAZINE 25 MG tablet  Commonly known as:  APRESOLINE  Take 25 mg by mouth 3 (three) times daily.     isosorbide mononitrate 60 MG 24 hr tablet  Commonly known as:  IMDUR  Take 30-60 mg by mouth 2 (two) times daily. 30 mg every morning and 60 mg every evening     ketoconazole 2 % cream  Commonly known as:  NIZORAL  Apply 1 application topically daily. To left side of face,  behind ears, and to scalp     METAMUCIL PO  Take by mouth daily. Mix 1 teaspoonful with water every day     metoprolol succinate 50 MG 24 hr tablet  Commonly known as:  TOPROL-XL  Take 50 mg by mouth at bedtime.     multivitamin with minerals Tabs  Take 2 tablets by mouth daily.     nitroGLYCERIN 0.4 MG SL tablet  Commonly known as:  NITROSTAT  Place 0.4 mg under the tongue every 5 (five) minutes as needed. For chest pain     potassium chloride 10 MEQ tablet  Commonly known as:  K-DUR  Take 10 mEq by mouth daily.     rosuvastatin 20 MG tablet  Commonly known as:  CRESTOR  Take 20 mg by mouth daily.     torsemide 20 MG tablet  Commonly known as:  DEMADEX  Take 2 tablets (40 mg total) by mouth 2 (two) times daily.           Follow-up Information   Follow up with [facility] (our office will call you to arrange a f/u appointment)    Contact information:   [address] [place] [address] [phone]     This D/C summary took 45 minutes to complete.   Signed: Allayne Butcher, PA-C 09/13/2012, 3:55 PM

## 2012-09-13 NOTE — Progress Notes (Signed)
[facility]  Subjective: No complaints. Breathing and feeling better. No further leg pain. No more abdominal discomfort/distension.  Objective: Vital signs in last 24 hours: Temp:  [97.2 F (36.2 C)-98.7 F (37.1 C)] 97.2 F (36.2 C) (02/14 0617) Pulse Rate:  [74-106] 78 (02/14 0949) Resp:  [18-19] 18 (02/14 0617) BP: (116-183)/(59-81) 124/59 mmHg (02/14 0949) SpO2:  [92 %-96 %] 93 % (02/14 0617) Weight:  [176 lb 4.8 oz (79.969 kg)] 176 lb 4.8 oz (79.969 kg) (02/14 0617) Last BM Date: 09/12/12  Intake/Output from previous day: 02/13 0701 - 02/14 0700 In: 960 [P.O.:960] Out: 1900 [Urine:1900] Intake/Output this shift: Total I/O In: 220 [P.O.:220] Out: 200 [Urine:200]  Medications Current Facility-Administered Medications  Medication Dose Route Frequency Provider Last Rate Last Dose  . 0.9 %  sodium chloride infusion  250 mL Intravenous PRN Nada Boozer, NP      . acetaminophen (TYLENOL) tablet 650 mg  650 mg Oral Q4H PRN Nada Boozer, NP   650 mg at 09/10/12 1114  . ALPRAZolam Prudy Feeler) tablet 0.25 mg  0.25 mg Oral TID PRN Nada Boozer, NP   0.25 mg at 09/09/12 2104  . aspirin EC tablet 81 mg  81 mg Oral Daily Nada Boozer, NP   81 mg at 09/13/12 0940  . atorvastatin (LIPITOR) tablet 40 mg  40 mg Oral q1800 Nada Boozer, NP   40 mg at 09/12/12 1705  . docusate sodium (COLACE) capsule 100 mg  100 mg Oral Daily Chrystie Nose, MD   100 mg at 09/13/12 0939  . heparin injection 5,000 Units  5,000 Units Subcutaneous Q8H Nada Boozer, NP   5,000 Units at 09/13/12 [phone]  . hydrALAZINE (APRESOLINE) tablet 25 mg  25 mg Oral Q8H Nada Boozer, NP   25 mg at 09/13/12 [phone]  . isosorbide mononitrate (IMDUR) 24 hr tablet 90 mg  90 mg Oral Daily Nada Boozer, NP   90 mg at 09/13/12 0940  . ketoconazole (NIZORAL) 2 % cream 1 application  1 application Topical Daily Nada Boozer, NP   1 application at 09/13/12 0941  . metoprolol succinate (TOPROL-XL) 24 hr tablet  50 mg  50 mg Oral QHS Nada Boozer, NP   50 mg at 09/12/12 2142  . multivitamin with minerals tablet 1 tablet  1 tablet Oral Daily Nada Boozer, NP   1 tablet at 09/13/12 0941  . nitroGLYCERIN (NITROSTAT) SL tablet 0.4 mg  0.4 mg Sublingual Q5 min PRN Nada Boozer, NP      . ondansetron [facility]) injection 4 mg  4 mg Intravenous Q6H PRN Nada Boozer, NP      . pantoprazole (PROTONIX) EC tablet 40 mg  40 mg Oral Daily Nada Boozer, NP   40 mg at 09/13/12 0941  . polyethylene glycol (MIRALAX / GLYCOLAX) packet 17 g  17 g Oral Daily Nada Boozer, NP   17 g at 09/13/12 0942  . sodium chloride 0.9 % injection 3 mL  3 mL Intravenous Q12H Nada Boozer, NP   3 mL at 09/13/12 0942  . sodium chloride 0.9 % injection 3 mL  3 mL Intravenous PRN Nada Boozer, NP      . torsemide [facility]) tablet 40 mg  40 mg Oral BID Chrystie Nose, MD   40 mg at 09/13/12 5409    PE: General appearance: alert, cooperative and no distress Lungs: clear to auscultation bilaterally Heart: regular rate and rhythm and distant heart sounds Abdomen: soft, non-tender; bowel sounds normal; no  masses,  no organomegaly, no distension Extremities: trace bilateral LEE Pulses: 2+ and symmetric Skin: warm and dry Neurologic: Grossly normal  Lab Results:  No results found for this basename: WBC, HGB, HCT, PLT,  in the last 72 hours BMET  Recent Labs  09/11/12 0555 09/12/12 0615  NA 138 138  K 4.0 3.5  CL 97 96  CO2 32 31  GLUCOSE 103* 114*  BUN 21 22  CREATININE 1.68* 1.64*  CALCIUM 9.2 9.5   Lipase     Component Value Date/Time   LIPASE 25 09/12/2012 1020   Hepatic Function Panel     Component Value Date/Time   PROT 7.2 09/12/2012 1020   ALBUMIN 3.6 09/12/2012 1020   AST 29 09/12/2012 1020   ALT 16 09/12/2012 1020   ALKPHOS 45 09/12/2012 1020   BILITOT 0.7 09/12/2012 1020   BILIDIR 0.1 09/12/2012 1020   IBILI 0.6 09/12/2012 1020    Assessment/Plan  Principal Problem:   Acute on chronic diastolic HF (heart  failure), EF 55-65% 12/13 with grade 1 diastolic dysfunction Active Problems:   PROSTATE CANCER   HEARING LOSS   SOB (shortness of breath)   CAD, 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 Low risk Myoview 1/14   Chronic renal disease, stage III, his SCr was up to 2.75 in November 2013 and diuretics held   Anasarca   Plan: Breathing better. He has diuresed ~ 5.5 L since admission. Weight difference from today since admission ~18 lbs. Pt had abdominal discomfort with distension 2 days ago. Amylase, Lipase and Hepatic function all WNL. No further distension or discomfort. Distension was likely from fluid retention. He is on 20 mg torsemide BID. Cr yesterday was mildly elevated, but stable at  1.64. K+ was WNL. Exam benign. HR and BP both stable. Can likely d/c home today. MD to see and will determine status.   LOS: 4 days    Lehua Flores M. Delmer Islam 09/13/2012 10:36 AM

## 2012-09-13 NOTE — Progress Notes (Signed)
Pt. Seen and examined. Agree with the NP/PA-C note as written.  Doing well. Creatinine has stabilized. On torsemide 40 mg BID. Still net negative. Ok for discharge home today. Will need BMP/BNP early next week and follow-up with midlevel provider in 5-7 days.  Chrystie Nose, MD, [facility] Attending Cardiologist [facility]

## 2012-09-13 NOTE — Progress Notes (Signed)
All d/c instructions explained as given to pt and care taker.  Verbalized understanding.  D/C to home escorted by caretaker.  Jalynne Persico,RN.

## 2012-11-07 ENCOUNTER — Other Ambulatory Visit (INDEPENDENT_AMBULATORY_CARE_PROVIDER_SITE_OTHER): Payer: Medicare HMO

## 2012-11-07 ENCOUNTER — Ambulatory Visit (INDEPENDENT_AMBULATORY_CARE_PROVIDER_SITE_OTHER): Payer: Medicare HMO | Admitting: Adult Health

## 2012-11-07 ENCOUNTER — Encounter: Payer: Self-pay | Admitting: Adult Health

## 2012-11-07 VITALS — BP 144/80 | HR 105 | Temp 97.7°F | Ht 72.0 in | Wt 189.2 lb

## 2012-11-07 DIAGNOSIS — R413 Other amnesia: Secondary | ICD-10-CM

## 2012-11-07 NOTE — Patient Instructions (Addendum)
I will call with labs  We are setting you up for a CT Head .  Can use Seroquel 25mg  1/2 -1 At bedtime  As needed  Sleep and agitation follow up Dr. Kriste Basque  Next week  Please contact office for sooner follow up if symptoms do not improve or worsen or seek emergency care

## 2012-11-08 ENCOUNTER — Telehealth: Payer: Self-pay | Admitting: Adult Health

## 2012-11-08 ENCOUNTER — Ambulatory Visit (INDEPENDENT_AMBULATORY_CARE_PROVIDER_SITE_OTHER)
Admission: RE | Admit: 2012-11-08 | Discharge: 2012-11-08 | Disposition: A | Discharge status: Home / Self Care | Payer: Medicare HMO | Source: Ambulatory Visit | Attending: Adult Health | Admitting: Adult Health

## 2012-11-08 DIAGNOSIS — R413 Other amnesia: Secondary | ICD-10-CM

## 2012-11-08 LAB — CBC WITH DIFFERENTIAL/PLATELET
Basophils Relative: 0.7 % (ref 0.0–3.0)
Hemoglobin: 15 g/dL (ref 13.0–17.0)
Lymphocytes Relative: 19.6 % (ref 12.0–46.0)
Monocytes Relative: 9.1 % (ref 3.0–12.0)
Neutro Abs: 9.4 10*3/uL — ABNORMAL HIGH (ref 1.4–7.7)
RBC: 4.41 Mil/uL (ref 4.22–5.81)
WBC: 13.4 10*3/uL — ABNORMAL HIGH (ref 4.5–10.5)

## 2012-11-08 LAB — BASIC METABOLIC PANEL
CO2: 31 mEq/L (ref 19–32)
GFR: 30.02 mL/min — ABNORMAL LOW (ref >60.00)
Glucose, Bld: 93 mg/dL (ref 70–99)
Potassium: 3.6 mEq/L (ref 3.5–5.1)
Sodium: 138 mEq/L (ref 135–145)

## 2012-11-08 LAB — URINALYSIS, ROUTINE W REFLEX MICROSCOPIC
Nitrite: NEGATIVE
Total Protein, Urine: NEGATIVE
pH: 7 (ref 5.0–8.0)

## 2012-11-08 LAB — TSH: TSH: 0.72 u[IU]/mL (ref 0.35–5.50)

## 2012-11-08 LAB — HEPATIC FUNCTION PANEL: ALT: 31 U/L (ref 0–53)

## 2012-11-08 MED ORDER — DONEPEZIL HCL 5 MG PO TABS: 5.0000 mg | ORAL_TABLET | Freq: Every day | ORAL | Status: DC

## 2012-11-08 NOTE — Telephone Encounter (Signed)
CT head w/ no evidence of trauma from fall , no sign OF acute stroke.  Suspect this is from senile dementia Rec:  Begin Aricept 5mg  At bedtime  -rx sent to pharm  Can refill seroquel 25mg  1-2 At bedtime  As needed  Sleep #60 . No refill .  Please contact office for sooner follow up if symptoms do not improve or worsen or seek emergency care   follow up Dr. Kriste Basque  In 1 week and As needed

## 2012-11-08 NOTE — Telephone Encounter (Signed)
I spoke with [name] and she stated pt took 1 whole tablet of Seroquel and was still not able to sleep anything last night. I also advised her labs was still pending. She also stated she spoke with pt daughter and pt has not been on aricept and would be okay with pt taking this Please advise TP thanks

## 2012-11-08 NOTE — Telephone Encounter (Signed)
Spoke with [place].  She was not aware script for aricept was called in.  Pt continues to cry, and she is concerned that seroquel has paradoxical effect with his sleep.  She reports that he is taking xanax bid, and this helps.  Advised her that aricept script has been sent to pharmacy, and she should get this as soon as possible.  Advised her to give evening dose of xanax now, and she could also give him benadryl 25 mg x one tonight to assist with sleep.  Explained that she could try doubling dose of seroquel to 50 mg to assist with sleep.  Advised her to call back if this does not work.  Will forward message to Dr. Kriste Basque to be aware of plan.

## 2012-11-08 NOTE — Telephone Encounter (Signed)
Can we check to see the UA results are done from Yesterday  Also waiting on CT head results.

## 2012-11-09 NOTE — Assessment & Plan Note (Addendum)
Worsening memory impairment with suspected senile dementia  Will need to check labs to r/o reversible etilogy  Also check CT head to r/o acute issues.  May use Seroquel for night sleep issues and agitation   Plan;  I will call with labs  We are setting you up for a CT Head .  Can use Seroquel 25mg  1/2 -1 At bedtime  As needed  Sleep and agitation follow up Dr. Kriste Basque  Next week  Please contact office for sooner follow up if symptoms do not improve or worsen or seek emergency care

## 2012-11-09 NOTE — Progress Notes (Signed)
Subjective:    Patient ID: Billy Morgan, male    DOB: [birth year], 79 y.o.   MRN: [id number]  HPI89 y/o WM    ~  August 16, 2010:  16 month ROV & review> he is quite remarkable for 79 y/o- still lives in own home & cares for wife w/ help of CNA's that they hire in shifts, his son supervises everyting but confirms that he is doing satis... pt has no new complaints or concerns... +HOH;  BP controlled on meds;  denies CP, palpit, SOB, edema, etc;  Chol looks good on Simva40;  GI & GU are stable;  DJD is mod & well managed by the diclofenac...   ~  January 23, 2011:  90mo ROV & he continues to do remarkably well for 89, no new complaints or concerns; he remains active, in his own home w/ caregivers helping daily, family nearby & on his own at night w/o problems identified;  Wife passed away 11/17/22 on home hospice after hosp 3/12 w/ stroke etc.    Followed by Howell Rucks for [facility], seen 2/12 & his note reviewed; Myoview was neg- no scar or ischemia & EF=74%; no changes made...  ~  August 02, 2011:  29mo ROV & Billy Morgan is c/o URI "stopped up" he says; notes "chest cold" x 3d w/ chest congestion, "tight", mild cough, yellow phlegm, hoarse; denies f/c/s, SOB, edema; he had Flu vaccine 11/12;  We discussed RX w/ Depo120, ZPak, Mucinex, Fluids, Tylenol, etc...    He reports going to ER 9/12 w/ left flank pain per ER notes reviewed> Exam was neg; Urine was clear; Labs were essentially wnl (K 3.4, Creat 1.5, WBC 10.5); CT Abd done & no acute prob ident (see below for mult incidental findings in the 79 y/o man); pain was felt to be musculoskeletal & given Vicodin> he improved over time & resolved...     HBP> on MetoprololER50-1/2Bid, Felodipine10, Demadex20- DrKelly incr to 2/d & extra if needed; BP= 118/60 & they bring an extensive BP log but ave pressures are ok & he denies CP, palpit, dizzy, syncope, SOB; they report some edema which prompted drKelly to incr the diuretic but Creat up to 2.0 today & cautioned to minimize the  diuretic- avoid sodium, elev legs, wear support hose...    CAD, s/p CABG x5 in 1997> on ASA81+ above meds, followed by [facility] & seen 10/12, stable & no changes made; son tells me he was recently seen w/ incr edema & weight (wt today= 190# up 11#)==> told to increase the Demadex & we will fax copies of labs to [facility] (note BNP today= 229)...    PAD> on ASA81 & followed by [facility]; prev sonar & CTAbd revealed extensive atherosclerotic changes, tortuous Ao & iliacs, no aneurysm...    Hyperlipid> on Crestor20 now; prev on Simva40 & ?changed by [facility]?; last FLP on Simva 1/12 as noted below, not fasting today...    GI> GERD, IBS> on Prilosec OTC prn & Miralax prn; they deny GI issues at present...    GU> hx prostate ca & renal insuffic> treated w/ radium seed implants in 2000; slowly rising PSA noted but we don't have notes from [facility]); ?when he was last seen?  As noted he has mild renal insuffic w/ baseline Creat ~1.5-1.6 but labs today 2.0 after incr in diuretic; asked to decr Demadex back to prev level & caution w/ NSAIDs...    DJD> on Voltaren 50Bid as needed & asked to minimize the NSAID  rx, use Tylenol first...    Memory loss/ anxiety> on Xanax0.25 prn; he manages pretty well for 89 w/ son's help...   02/06/12 Acute OV  Pt presents accompanied by his LPN caregiver for an acute ov  Complains over last 7-10 days legs have been more swollen than usual right > left.  Initially had some increased dyspnea but this improved after diuretics were increased.  Pt says he feels fine except legs are swollen.  No known injury, calf pain or chest pain.  Says he was instructed to increase his lasix by Dr. Tresa Endo and a Dr. Skipper Cliche" -son in law To 100mg  over last 5 days with 1 day at 160mg  .  Swelling is some better but not resolved. UOP did not increase as they expected.  Last scr was 2.0  08/2011.  Denies fever, chest pain , abd pain or n/v.  Weight has trended up over last year, and is up 8 lbs since last ov  in January.  >>labs done   11/09/2012 Acute OV  Pt accompanied by caregiver  Complains of memory loss, worsening confusion, agitation, easily upset x1 week, went from needing 8-hour care to 24-hour care within this time.   Over last year with more confusion and agitation.  Worse for last week. More balance problems w/ frequent falls.  NO visual/speech changes. No swallow issues.  Has not been seen ?9months. Unclear why he has not followed back in office.  Pt is alert and knows my name. Confused to date .  Alert to person and place.  Poor recall .    Problem List:   HEARING LOSS (ICD-389.9) - he has hearing aides...  HYPERTENSION (ICD-401.9) - on METOPROLOL 50mg - taking 1/2Bid,  FELODIPINE 10mg /d,  TORSEMIDE 20mg - increased by [facility] to 2-4 daily ...  ~  6/12: BP= 128/62 & similar at home, takes meds regularly & tolerates well... denies HA, visual changes, CP, palipit, dizziness, syncope, dyspnea, edema, etc...  ~  12/12: BP= 118/60 & DrKelly recently increased Demadex due to swelling & wt gain; Creat up to 2.0 & asked to decr the demadex to prev level (?2daily).  ATHEROSCLEROTIC HEART DISEASE (ICD-414.00) - on ASA 81mg /d; followed by Howell Rucks for [facility]> . he had CABG x5 in 1997 by DrBartle; denies CP/ angina, palpit, SOB, etc... ~  NuclearStressTest 7/07 was neg- no ischemia or infarct... non-gated due to ectopy. ~  2DEcho 11/07 showed norm LV wall thickness & LVF, mild RA & LA dil, mild MR... ~  Myoview 2/12 was neg> no scar or ischemia, & EF=74%... ~  10/12: DrKelly's note indicates new T-inversions in III & avF not seen before, pt asymptomatic & they are following.  PERIPHERAL VASCULAR DISEASE (ICD-443.9) - on ASA 81mg /d and followed by [facility] as noted... ~  Abd Sonar 5/01 showed tortuous Ao w/ mod atherosclerotic changes, no aneurysm. ~  CT Abd 9/12 via ER for flank pain showed incidental finding of extensive atherosclerotic changes in Ao & iliacs, no aneurysm.  VENOUS  INSUFFICIENCY (ICD-459.81)  HYPERLIPIDEMIA (ICD-272.4) - prev on Simva40, now lists CRESTOR 20mg /d (?changed by [facility]?). ~  FLP 4/07 on Simva20 showed TChol 123, TG 62, HDL 35, LDL 76... rec- continue same. ~  FLP 10/09 on Simva20 showed TChol 156, TG 84, HDL 36, LDL 103 ~  FLP 9//10 on Simva20 showed TChol 227, TG 79, HDL 36, LDL 176... ?taking regularly? incr Simva40. ~  FLP 1/12 on Simva40 showed TChol 164, TG 108, HDL 38, LDL 104 ~  1/13:  Pt was changed to Cres20 in the interval (?by [facility]), not fasting today for lipid profile...  GERD (ICD-530.81) - uses OTC meds Prn... denies nausea, vomiting, heartburn, diarrhea, constipation, blood in stool, abdominal pain, swelling, gas...   IRRITABLE BOWEL SYNDROME (ICD-564.1) - on MIRALAX Prn... ~  last colonoscopy 8/07 by Dr Arlyce Dice showed divertics only...  PROSTATE CANCER (ICD-185) & RENAL INSUFFICIENCY (ICD-588.9) - he had radium seed implants 7/00 & is followed by Joneen Boers- we don't have any recent notes... ~  labs here 8/06 showed PSA = 0.11 ~  labs 10/09 showed PSA= 0.73... BUN= 18, Creat= 1.2 ~  labs 9/10 showed PSA= 1.10... slowly increasing PSA may mean recurrent Ca- rec> f/u w/ Urology. ~  labs 1/12 showed PSA= 1.23... BUN= 22, Creat= 1.6 ~  Labs 1/13 showed PSA= 1.72... BUN= 25, Creat= 2.0.Marland KitchenMarland Kitchen Rec> decr Demadex back to prev dose (1-2 daily), minimize NSAIDs & needs f/u Urology.  DEGENERATIVE JOINT DISEASE (ICD-715.90) - on DICLOFENAC 50mg Bid.. he sees DrAplington/ Contractor at [employer]...  MEMORY LOSS (ICD-780.93) - he notes prob w/ memory... "I don't drive at night" (he got lost and son had to get him)... MMSE= 28/30 last yr... ~  CT Brain 4/08 showed atrophy & sm vessel dis, no acute changes...  ANXIETY (ICD-300.00) - on ALPRAZOLAM 0.25mg  Prn...   Past Surgical History  Procedure Laterality Date  . Inguinal hernia repair    . Appendectomy    . Coronary artery bypass graft    . Coronary angioplasty with stent placement       Outpatient Encounter Prescriptions as of 11/07/2012  Medication Sig Dispense Refill  . acetaminophen (TYLENOL) 650 MG CR tablet Take 650 mg by mouth every 8 (eight) hours as needed for pain (for pain).      Marland Kitchen ALPRAZolam (XANAX) 0.25 MG tablet Take 0.25 mg by mouth at bedtime.      Marland Kitchen aspirin 81 MG tablet Take 81 mg by mouth daily.        . clopidogrel (PLAVIX) 75 MG tablet Take 75 mg by mouth daily.      Marland Kitchen FIBER SELECT GUMMIES CHEW Chew 1 once daily      . hydrALAZINE (APRESOLINE) 25 MG tablet Take 25 mg by mouth 3 (three) times daily.      . isosorbide mononitrate (IMDUR) 60 MG 24 hr tablet 30 mg every morning and 60 mg every evening      . ketoconazole (NIZORAL) 2 % cream Apply 1 application topically daily. To left side of face, behind ears, and to scalp      . metoprolol succinate (TOPROL-XL) 50 MG 24 hr tablet 1/2 tab by mouth at bedtime      . Multiple Vitamin (MULTIVITAMIN WITH MINERALS) TABS Take 2 tablets by mouth daily.      . nitroGLYCERIN (NITROSTAT) 0.4 MG SL tablet Place 0.4 mg under the tongue every 5 (five) minutes as needed. For chest pain      . potassium chloride SA (K-DUR,KLOR-CON) 20 MEQ tablet Take 20 mEq by mouth daily.      . Psyllium (METAMUCIL PO) Take by mouth daily. Mix 1 teaspoonful with water every day      . rosuvastatin (CRESTOR) 20 MG tablet Take 20 mg by mouth daily.        Marland Kitchen torsemide (DEMADEX) 20 MG tablet Take 2 tablets (40 mg total) by mouth 2 (two) times daily.  120 tablet  5  . [DISCONTINUED] potassium chloride (K-DUR) 10 MEQ tablet Take 10 mEq  by mouth daily.       No facility-administered encounter medications on file as of 11/07/2012.    Allergies  Allergen Reactions  . Sulfamethoxazole     REACTION: unspecified    Current Medications, Allergies, Past Medical History, Past Surgical History, Family History, and Social History were reviewed in [employer] record.    Review of Systems      Constitutional:   No  weight  loss, night sweats,  Fevers, chills ,+ fatigue, or  lassitude.  HEENT:   No headaches,  Difficulty swallowing,  Tooth/dental problems, or  Sore throat,                No sneezing, itching, ear ache,  +nasal congestion, post nasal drip,   CV:  No chest pain,  Orthopnea, PND, swelling in lower extremities, anasarca, dizziness, palpitations, syncope.   GI  No heartburn, indigestion, abdominal pain, nausea, vomiting, diarrhea, change in bowel habits, loss of appetite, bloody stools.   Resp: No shortness of breath with exertion or at rest.  No excess mucus, no productive cough,  No non-productive cough,  No coughing up of blood.  No change in color of mucus.  No wheezing.  No chest wall deformity  Skin: no rash or lesions.  GU: no dysuria, change in color of urine, no urgency or frequency.  No flank pain, no hematuria   MS:  No joint pain or swelling.  No decreased range of motion.  No back pain.  Psych:  +change in mood or affect. + anxiety.  ++memory loss.+sleep issues         Objective:   Physical Exam    WD, WN, [name] in NAD... GENERAL:  Alert & oriented; pleasant & cooperative... HEENT:  [place]/AT,  EACs-clear, TMs-wnl, NOSE-sl congestion, THROAT-clear w/o exud... NECK:  Supple w/ fairROM; no JVD; normal carotid impulses w/o bruits; no thyromegaly  palpated; no lymphadenopathy.  CHEST:  Diminshed BS in bases   no rales or signs of consolidation... HEART:  Regular Rhythm; Gr 1/6 SEM w/o rubs or gallops. ABDOMEN:  Soft w/ normal bowel sounds; non-tender, no organomegaly or masses detected. EXT: without deformities, mild arthritic changes; no varicose veins/ +venous insuffic/ tr-1 edema., no redness , neg homans sign.  NEURO:    no focal neuro deficits..nml grips, neg pronator drift. No facial droop. Balance issues .  DERM:  No lesions noted; no rash etc...   Assessment & Plan:

## 2012-11-11 NOTE — Telephone Encounter (Signed)
Per 4.11.14 result notes, left message with [name]'s coworker TCB to discuss results and get update on patient. Also, was she never called on Friday? Will route back to me to follow up on.

## 2012-11-11 NOTE — Telephone Encounter (Signed)
[name] returned call (same ph#). Billy Morgan
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
## 2012-11-11 NOTE — Progress Notes (Signed)
Quick Note:  LM w/ coworker of [name] to discuss results x1. ______

## 2012-11-11 NOTE — Progress Notes (Signed)
Quick Note:  LM w/ coworker of [name] to discuss results x1. ______ 

## 2012-11-13 ENCOUNTER — Encounter: Payer: Self-pay | Admitting: Pulmonary Disease

## 2012-11-13 ENCOUNTER — Ambulatory Visit (INDEPENDENT_AMBULATORY_CARE_PROVIDER_SITE_OTHER): Payer: Medicare HMO | Admitting: Pulmonary Disease

## 2012-11-13 VITALS — BP 104/64 | HR 97 | Temp 97.5°F | Ht 72.0 in | Wt 189.0 lb

## 2012-11-13 DIAGNOSIS — I2510 Atherosclerotic heart disease of native coronary artery without angina pectoris: Secondary | ICD-10-CM

## 2012-11-13 DIAGNOSIS — F32A Depression, unspecified: Secondary | ICD-10-CM

## 2012-11-13 DIAGNOSIS — I739 Peripheral vascular disease, unspecified: Secondary | ICD-10-CM

## 2012-11-13 DIAGNOSIS — I872 Venous insufficiency (chronic) (peripheral): Secondary | ICD-10-CM

## 2012-11-13 DIAGNOSIS — R413 Other amnesia: Secondary | ICD-10-CM

## 2012-11-13 DIAGNOSIS — M1990 Unspecified osteoarthritis, unspecified site: Secondary | ICD-10-CM

## 2012-11-13 DIAGNOSIS — E785 Hyperlipidemia, unspecified: Secondary | ICD-10-CM

## 2012-11-13 DIAGNOSIS — F329 Major depressive disorder, single episode, unspecified: Secondary | ICD-10-CM

## 2012-11-13 DIAGNOSIS — K589 Irritable bowel syndrome without diarrhea: Secondary | ICD-10-CM

## 2012-11-13 DIAGNOSIS — I10 Essential (primary) hypertension: Secondary | ICD-10-CM

## 2012-11-13 DIAGNOSIS — I5033 Acute on chronic diastolic (congestive) heart failure: Secondary | ICD-10-CM

## 2012-11-13 DIAGNOSIS — K219 Gastro-esophageal reflux disease without esophagitis: Secondary | ICD-10-CM

## 2012-11-13 DIAGNOSIS — C61 Malignant neoplasm of prostate: Secondary | ICD-10-CM

## 2012-11-13 DIAGNOSIS — F411 Generalized anxiety disorder: Secondary | ICD-10-CM

## 2012-11-13 DIAGNOSIS — N183 Chronic kidney disease, stage 3 unspecified: Secondary | ICD-10-CM

## 2012-11-13 MED ORDER — SERTRALINE HCL 25 MG PO TABS: 25.0000 mg | ORAL_TABLET | Freq: Every day | ORAL | Status: DC

## 2012-11-13 NOTE — Progress Notes (Signed)
Subjective:    Patient ID: Billy Morgan, male    DOB: October 07, 1917, 79 y.o.   MRN: [id number]  HPI [name] here for a follow up visit... he has multiple medical problems as noted below...   ~  August 16, 2010:  16 month ROV & review> he is quite remarkable for 78 y/o- still lives in own home & cares for wife w/ help of CNA's that they hire in shifts, his son supervises everyting but confirms that he is doing satis... pt has no new complaints or concerns... +HOH;  BP controlled on meds;  denies CP, palpit, SOB, edema, etc;  Chol looks good on Simva40;  GI & GU are stable;  DJD is mod & well managed by the diclofenac...   ~  January 23, 2011:  54mo ROV & he continues to do remarkably well for 89, no new complaints or concerns; he remains active, in his own home w/ caregivers helping daily, family nearby & on his own at night w/o problems identified;  Wife passed away 26-Nov-2022 on home hospice after hosp 3/12 w/ stroke etc.    Followed by Billy Morgan for [facility], seen 2/12 & his note reviewed; Myoview was neg- no scar or ischemia & EF=74%; no changes made...  ~  August 02, 2011:  26mo ROV & Billy Morgan is c/o URI "stopped up" he says; notes "chest cold" x 3d w/ chest congestion, "tight", mild cough, yellow phlegm, hoarse; denies f/c/s, SOB, edema; he had Flu vaccine 11/12;  We discussed RX w/ Depo120, ZPak, Mucinex, Fluids, Tylenol, etc...    He reports going to ER 9/12 w/ left flank pain per ER notes reviewed> Exam was neg; Urine was clear; Labs were essentially wnl (K 3.4, Creat 1.5, WBC 10.5); CT Abd done & no acute prob ident (see below for mult incidental findings in the 79 y/o man); pain was felt to be musculoskeletal & given Vicodin> he improved over time & resolved...     HBP> on MetoprololER50-1/2Bid, Felodipine10, Demadex20- Billy Morgan incr to 2/d & extra if needed; BP= 118/60 & they bring an extensive BP log but ave pressures are ok & he denies CP, palpit, dizzy, syncope, SOB; they report some edema which prompted  Billy Morgan to incr the diuretic but Creat up to 2.0 today & cautioned to minimize the diuretic- avoid sodium, elev legs, wear support hose...    CAD, s/p CABG x5 in 1997> on ASA81+ above meds, followed by Billy Morgan & seen 10/12, stable & no changes made; son tells me he was recently seen w/ incr edema & weight (wt today= 190# up 11#)==> told to increase the Demadex & we will fax copies of labs to Billy Morgan (note BNP today= 229)...    PAD> on ASA81 & followed by Billy Morgan; prev sonar & CTAbd revealed extensive atherosclerotic changes, tortuous Ao & iliacs, no aneurysm...    Hyperlipid> on Crestor20 now; prev on Simva40 & ?changed by Billy Morgan?; last FLP on Simva 1/12 as noted below, not fasting today...    GI> GERD, IBS> on Prilosec OTC prn & Miralax prn; they deny GI issues at present...    GU> hx prostate ca & renal insuffic> treated w/ radium seed implants in 2000; slowly rising PSA noted but we don't have notes from Billy Morgan); ?when he was last seen?  As noted he has mild renal insuffic w/ baseline Creat ~1.5-1.6 but labs today 2.0 after incr in diuretic; asked to decr Demadex back to prev level & caution w/ NSAIDs.Marland KitchenMarland Kitchen  DJD> on Voltaren 50Bid as needed & asked to minimize the NSAID rx, use Tylenol first...    Memory loss/ anxiety> on Xanax0.25 prn; he manages pretty well for 89 w/ son's help...  ~  November 13, 2012:  98mo ROV & [name] has been going downhill- saw Billy Morgan 11/09/12 w/ family noting memory loss, confusion, agitation, etc & now required 24h care Billy Morgan  & Billy Morgan - [facility]); they tried Billy Morgan but they state INTOL (too sedated even w/ 1/2 tab); per care giver he is depressed, tearful, and we decided to try Sertraline 25=>50mg /d... He is followed very freq by Billy Morgan/ Billy Morgan & was [facility] for CHF in Feb2014, diuresed w/ mult diuretics, and he notes labs done every week by Billy Morgan & he doesn't want additional lab work done here...      HBP> on MetoprololER25Qhs, Apres25Tid, Demadex20-2Bid, K20; BP= 104/64 & he  denies CP, palpit, dizzy, syncope, SOB; edema managed by Billy Morgan w/ freq adjustments in his diuretic regimen...    CAD, s/p CABG x5 in 1997, CHF> on ASA81+ Imdur-30am&60pm, followed by Billy Morgan & he was hosp 2/14 w/ CHF (mostly diastolic) & since then he has had freq (wkly) lab draws at Billy Morgan...     PAD> on ASA81 & followed by [facility]; prev sonar & CTAbd revealed extensive atherosclerotic changes, tortuous Ao & iliacs, no aneurysm...    Hyperlipid> on Crestor20 now; prev on Simva40 & ?changed by Billy Morgan?; last FLP on Simva 1/12 as noted below, not fasting today...    GI> GERD, IBS> on Prilosec OTC prn & Miralax prn; they deny GI issues at present...    GU> hx prostate ca & renal insuffic> treated w/ radium seed implants in 2000; slowly rising PSA noted but we don't have notes from Billy Morgan); ?when he was last seen?  As noted he has mild renal insuffic w/ baseline Creat ~1.5-1.6 but labs recently 2.2 after incr in diuretic; asked to decr Demadex back to prev level & caution w/ NSAIDs...    DJD> on Voltaren 50Bid as needed & asked to minimize the NSAID rx, use Tylenol first...    Memory loss/ anxiety/ depression> on Aricept5, Xanax0.25 prn; using Melatonin OTC for sleep (it helps); they note INTL Seroquel- we will try Sertraline 25/d...  We reviewed prob list, meds, xrays and labs> see below for updates >>  CT Head 4/14 showed sm vessel dis & global atrophy, no acute changes...  LABS here 4/14:  Chems- ok x BUN=24, Cr=2.2 (up from 1.6 in hosp 2/14);  CBC- wnl x WBC=13.4;  TSH=0.72;  VitB12=293;  RPR=neg;  UA- clear...          Problem List:   HEARING LOSS (ICD-389.9) - he has hearing aides...  HYPERTENSION (ICD-401.9) - on METOPROLOL 50mg - taking 1/2Bid,  FELODIPINE 10mg /d,  TORSEMIDE 20mg - increased by [facility] to 2-4 daily ...  ~  6/12: BP= 128/62 & similar at home, takes meds regularly & tolerates well... denies HA, visual changes, CP, palipit, dizziness, syncope, dyspnea, edema, etc...   ~  12/12: BP= 118/60 & Billy Morgan recently increased Demadex due to swelling & wt gain; Creat up to 2.0 & asked to decr the demadex to prev level (?2daily). ~  CXR 2/14 showed heart at upper lim for size, prior CABG, calcif pleural plaques at left base, bibasilar scarring, NAD...  ATHEROSCLEROTIC HEART DISEASE (ICD-414.00) - on ASA 81mg /d; followed by Billy Morgan for Cards> he had CABG x5 in 1997 by DrBartle; denies CP/ angina, palpit, SOB, etc... ~  NuclearStressTest 7/07 was  neg- no ischemia or infarct... non-gated due to ectopy. ~  2DEcho 11/07 showed norm LV wall thickness & LVF, mild RA & LA dil, mild MR... ~  Myoview 2/12 was neg> no scar or ischemia, & EF=74%... ~  10/12: Billy Morgan's note indicates new T-inversions in III & avF not seen before, pt asymptomatic & they are following. ~  EKG 2/14 showed NSR, rate69, 1st degree AVB, otherw wnl... ~  [facility] 2/14 for diastolic heart failure> diuresed & meds adjusted- disch on Demadex20-2Bid + Plavix added (?this was later stopped by [facility]?)... ~  2/14:  Billy Morgan/ Billy Morgan saw pt> post hosp for CHF & diuresed- trying Lasix, Demadex, Zaroxyln; known CAD, RI, right heart failure; pt states they do lab work on him every week & he doesn't want me checking this as well...  PERIPHERAL VASCULAR DISEASE (ICD-443.9) - on ASA 81mg /d and followed by [facility] as noted... ~  Abd Sonar 5/01 showed tortuous Ao w/ mod atherosclerotic changes, no aneurysm. ~  CT Abd 9/12 via ER for flank pain showed incidental finding of extensive atherosclerotic changes in Ao & iliacs, no aneurysm.  VENOUS INSUFFICIENCY (ICD-459.81)  HYPERLIPIDEMIA (ICD-272.4) - prev on Simva40, now lists CRESTOR 20mg /d (?changed by [facility]?). ~  FLP 4/07 on Simva20 showed TChol 123, TG 62, HDL 35, LDL 76... rec- continue same. ~  FLP 10/09 on Simva20 showed TChol 156, TG 84, HDL 36, LDL 103 ~  FLP 9//10 on Simva20 showed TChol 227, TG 79, HDL 36, LDL 176... ?taking regularly? incr Simva40. ~  FLP 1/12 on  Simva40 showed TChol 164, TG 108, HDL 38, LDL 104 ~  1/13: Pt was changed to Cres20 in the interval (?[facility]), not fasting today for lipid profile...  GERD (ICD-530.81) - uses OTC meds Prn... denies nausea, vomiting, heartburn, diarrhea, constipation, blood in stool, abdominal pain, swelling, gas...  ~  Abd sonar 2/14 showed ?hepatic steatosis, 3mm polyp in GB- no stones or wall thickening etc; calcif in Abd Ao...  IRRITABLE BOWEL SYNDROME (ICD-564.1) - on MIRALAX Prn... ~  last colonoscopy 8/07 by Dr Arlyce Dice showed divertics only...  PROSTATE CANCER (ICD-185) & RENAL INSUFFICIENCY (ICD-588.9) - he had radium seed implants 7/00 & is followed by Joneen Boers- we don't have any recent notes... ~  labs here 8/06 showed PSA = 0.11 ~  labs 10/09 showed PSA= 0.73... BUN= 18, Creat= 1.2 ~  labs 9/10 showed PSA= 1.10... slowly increasing PSA may mean recurrent Ca- rec> f/u w/ Billy. ~  labs 1/12 showed PSA= 1.23... BUN= 22, Creat= 1.6 ~  Labs 1/13 showed PSA= 1.72... BUN= 25, Creat= 2.0.Marland KitchenMarland Kitchen Rec> decr Demadex back to prev dose (1-2 daily), minimize NSAIDs & needs f/u Billy.  DEGENERATIVE JOINT DISEASE (ICD-715.90) - on DICLOFENAC 50mg Bid.. he sees DrAplington/ Contractor at [facility]...  MEMORY LOSS (ICD-780.93) - he notes prob w/ memory... "I don't drive at night" (he got lost and son had to get him)... MMSE= 28/30 last yr... ~  CT Brain 4/08 showed atrophy & sm vessel dis, no acute changes... ~  CT Head 4/14 showed sm vessel dis & global atrophy, no acute changes...  ANXIETY (ICD-300.00) - on ALPRAZOLAM 0.25mg  Prn...   Past Surgical History  Procedure Laterality Date  . Inguinal hernia repair    . Appendectomy    . Coronary artery bypass graft    . Coronary angioplasty with stent placement      Outpatient Encounter Prescriptions as of 11/13/2012  Medication Sig Dispense Refill  . acetaminophen (TYLENOL) 650 MG  CR tablet Take 650 mg by mouth every 8 (eight) hours as needed for pain (for  pain).      Marland Kitchen ALPRAZolam (XANAX) 0.25 MG tablet Take 0.25 mg by mouth at bedtime.      Marland Kitchen aspirin 81 MG tablet Take 81 mg by mouth daily.        Marland Kitchen donepezil (ARICEPT) 5 MG tablet Take 1 tablet (5 mg total) by mouth at bedtime.  30 tablet  5  . FIBER SELECT GUMMIES CHEW Chew 1 once daily      . hydrALAZINE (APRESOLINE) 25 MG tablet Take 25 mg by mouth 3 (three) times daily.      . isosorbide mononitrate (IMDUR) 60 MG 24 hr tablet 30 mg every morning and 60 mg every evening      . ketoconazole (NIZORAL) 2 % cream Apply 1 application topically daily. To left side of face, behind ears, and to scalp      . metoprolol succinate (TOPROL-XL) 50 MG 24 hr tablet 1/2 tab by mouth at bedtime      . Multiple Vitamin (MULTIVITAMIN WITH MINERALS) TABS Take 2 tablets by mouth daily.      . nitroGLYCERIN (NITROSTAT) 0.4 MG SL tablet Place 0.4 mg under the tongue every 5 (five) minutes as needed. For chest pain      . potassium chloride SA (K-DUR,KLOR-CON) 20 MEQ tablet Take 20 mEq by mouth daily. Takes 60 meq once weekly      . Psyllium (METAMUCIL PO) Take by mouth daily. Mix 1 teaspoonful with water every day      . rosuvastatin (CRESTOR) 20 MG tablet Take 20 mg by mouth daily.        Marland Kitchen torsemide (DEMADEX) 20 MG tablet Take 2 tablets (40 mg total) by mouth 2 (two) times daily.  120 tablet  5  . [DISCONTINUED] clopidogrel (PLAVIX) 75 MG tablet Take 75 mg by mouth daily.       No facility-administered encounter medications on file as of 11/13/2012.    Allergies  Allergen Reactions  . Sulfamethoxazole     REACTION: unspecified    Current Medications, Allergies, Past Medical History, Past Surgical History, Family History, and Social History were reviewed in [employer] record.    Review of Systems       See HPI - all other systems neg except as noted...      The patient complains of recent URI/ "cold" 1/13 + malaise, decreased hearing, dyspnea on exertion, constipation, nocturia,  joint pain, stiffness, arthritis, and anxiety.  The patient denies fever, chills, sweats, anorexia, fatigue, weakness, weight loss, sleep disorder, blurring, diplopia, eye irritation, eye discharge, vision loss, eye pain, photophobia, earache, ear discharge, tinnitus, nosebleeds, sore throat, chest pain, palpitations, syncope, orthopnea, PND, peripheral edema, dyspnea at rest, excessive sputum, hemoptysis, wheezing, pleurisy, nausea, vomiting, diarrhea, abdominal pain, melena, hematochezia, jaundice, gas/bloating, indigestion/heartburn, dysphagia, odynophagia, dysuria, hematuria, urinary frequency, urinary hesitancy, incontinence, back pain, joint swelling, muscle cramps, muscle weakness, sciatica, restless legs, leg pain at night, leg pain with exertion, rash, itching, dryness, suspicious lesions, paralysis, paresthesias, seizures, tremors, vertigo, transient blindness, frequent falls, frequent headaches, difficulty walking, depression, memory loss, confusion, cold intolerance, heat intolerance, polydipsia, polyphagia, polyuria, unusual weight change, abnormal bruising, bleeding, enlarged lymph nodes, urticaria, allergic rash, hay fever, and recurrent infections.     Objective:   Physical Exam     WD, WN, [name] in NAD... GENERAL:  Alert & oriented; pleasant & cooperative... HEENT:  [place]/AT, EOM-wnl,  PERRLA, EACs-clear, TMs-wnl, NOSE-sl congestion, THROAT-clear w/o exud... NECK:  Supple w/ fairROM; no JVD; normal carotid impulses w/o bruits; no thyromegaly or nodules palpated; no lymphadenopathy. CHEST:  Few scat rhonchi & wheezes; no rales or signs of consolidation... HEART:  Regular Rhythm; Gr 1/6 SEM w/o rubs or gallops. ABDOMEN:  Soft w/ normal bowel sounds; non-tender, no organomegaly or masses detected. EXT: without deformities, mild arthritic changes; no varicose veins/ +venous insuffic/ tr edema. NEURO:  CN's intact;  no focal neuro deficits... DERM:  No lesions noted; no rash  etc...  RADIOLOGY DATA:  Reviewed in the EPIC EMR & discussed w/ the patient...    >>CXR shows pleural thickening w/ some calcif, no infiltrate or mass, sl elev right hemidiaph, s/p CABG & tort calcif Ao.  LABORATORY DATA:  Reviewed in the EPIC EMR & discussed w/ the patient...    >>BUN 25, Creat 2.0, BNP 229, others essent wnl...   Assessment & Plan:    HBP>  Controlled on Metop, Apres, Torsemide> continue same & close f/u w/ Billy Morgan...  ASHD>  Followed by Billy Morgan & stable on ASA + above rx; neg Myoview 2/12, rec to continue to stay active etc...  ASPVD>  Aware, no signif claud symptoms & asked to remain as active as poss...  CHOL>  On Cres20 & stable, continue same...  GI>  GERD/  IBS>  Doing satis w/ Miralax regularly...  Prostate Cancer & mild Renal Insuffic>  Very slowly rising PSA s/p radium seeds 2000 & he is followed by Billy but we don't have notes...  DJD>  Aware, he uses Voltaren prn...  Memory Loss/ Depression>  Aware- try Zoloft25 to start...   Patient's Medications  New Prescriptions   SERTRALINE (ZOLOFT) 25 MG TABLET    Take 1 tablet (25 mg total) by mouth daily.  Previous Medications   ACETAMINOPHEN (TYLENOL) 650 MG CR TABLET    Take 650 mg by mouth every 8 (eight) hours as needed for pain (for pain).   ALPRAZOLAM (XANAX) 0.25 MG TABLET    Take 0.25 mg by mouth at bedtime.   ASPIRIN 81 MG TABLET    Take 81 mg by mouth daily.     DONEPEZIL (ARICEPT) 5 MG TABLET    Take 1 tablet (5 mg total) by mouth at bedtime.   FIBER SELECT GUMMIES CHEW    Chew 1 once daily   HYDRALAZINE (APRESOLINE) 25 MG TABLET    Take 25 mg by mouth 3 (three) times daily.   ISOSORBIDE MONONITRATE (IMDUR) 60 MG 24 HR TABLET    30 mg every morning and 60 mg every evening   KETOCONAZOLE (NIZORAL) 2 % CREAM    Apply 1 application topically daily. To left side of face, behind ears, and to scalp   METOPROLOL SUCCINATE (TOPROL-XL) 50 MG 24 HR TABLET    1/2 tab by mouth at bedtime   MULTIPLE  VITAMIN (MULTIVITAMIN WITH MINERALS) TABS    Take 2 tablets by mouth daily.   NITROGLYCERIN (NITROSTAT) 0.4 MG SL TABLET    Place 0.4 mg under the tongue every 5 (five) minutes as needed. For chest pain   POTASSIUM CHLORIDE SA (K-DUR,KLOR-CON) 20 MEQ TABLET    Take 20 mEq by mouth daily. Takes 60 meq once weekly   PSYLLIUM (METAMUCIL PO)    Take by mouth daily. Mix 1 teaspoonful with water every day   ROSUVASTATIN (CRESTOR) 20 MG TABLET    Take 20 mg by mouth daily.     TORSEMIDE (  DEMADEX) 20 MG TABLET    Take 2 tablets (40 mg total) by mouth 2 (two) times daily.  Modified Medications   No medications on file  Discontinued Medications   CLOPIDOGREL (PLAVIX) 75 MG TABLET    Take 75 mg by mouth daily.

## 2012-11-13 NOTE — Patient Instructions (Addendum)
Today we updated your med list in our EPIC system...    Continue your current medications the same...  We decided to add SERTRALINE 25mg  (low dose) one tab daily for depression...    Call me in 1 mo to report how he is doing on this med so we can make a mid-course adjustment if necessary...  Continue your regular medical follow up w/ drKelly...  Call for any questions...  Let's plan a follow up visit in 65mo, sooner if needed for problems.Marland KitchenMarland Kitchen

## 2012-11-25 ENCOUNTER — Other Ambulatory Visit: Payer: Self-pay | Admitting: Adult Health

## 2012-11-25 MED ORDER — TORSEMIDE 20 MG PO TABS: 20.0000 mg | ORAL_TABLET | Freq: Every day | ORAL | Status: DC

## 2012-11-25 NOTE — Progress Notes (Signed)
Quick Note:  Called spoke with patient's son Roe Coombs. Advised of CT results / recs as stated by TP. Don verbalized understanding and denied any questions. ______

## 2012-11-25 NOTE — Progress Notes (Signed)
Quick Note:  Called spoke with patient's son. Advised of lab results / recs as stated by TP. Don verbalized understanding and denied any questions - per son, demedex has already been decreased. Orders only encounter created to document med decrease. ______

## 2012-11-29 ENCOUNTER — Telehealth: Payer: Self-pay | Admitting: Pulmonary Disease

## 2012-11-29 MED ORDER — AMOXICILLIN-POT CLAVULANATE 875-125 MG PO TABS
1.0000 | ORAL_TABLET | Freq: Two times a day (BID) | ORAL | Status: DC
Start: 2012-11-29 — End: 2013-03-22

## 2012-11-29 MED ORDER — METHYLPREDNISOLONE 4 MG PO KIT: PACK | ORAL | Status: DC

## 2012-11-29 NOTE — Telephone Encounter (Signed)
Pt's son Dorene Sorrow returned call. [phone]. Hazel Sams

## 2012-11-29 NOTE — Telephone Encounter (Signed)
I spoke with Billy Morgan as Billy Morgan was not available. She is aware of recs. rx's have been sent. Nothing further was needed

## 2012-11-29 NOTE — Telephone Encounter (Signed)
Per SN---  augmentin 875 mg  #14  1 po bid Align once daily mucinex 600 mg  2 po bid Increase fluids Medrol dosepak  #1  Take as directed.

## 2012-11-29 NOTE — Telephone Encounter (Signed)
Pt's son, Dorene Sorrow called back.  Would like to know if pt will be coming in for an appt today or not.  Concerned b/c of the time now...     Billy Morgan

## 2012-11-29 NOTE — Telephone Encounter (Signed)
lmomtcb x1 for [employer]

## 2012-11-29 NOTE — Telephone Encounter (Signed)
I spoke with Dorene Sorrow. He stated pt has a pretty deep cough. He has had it since Wednesday. About 4:30 AM he expelled his food from last night and he didn't get any sleep last night. Pt has been using cough syrup and honey and was doing okay yesterday until last night. He stated pt care giver had a pretty bad cough and she went to the doctor and was giving different meds. Dorene Sorrow is concerned w/ pt age he doesn;t want it to turn into PNA. There are no available openings today. Please advise SN thanks Last OV 11/13/12 No pending appt Allergies  Allergen Reactions  . Sulfamethoxazole     REACTION: unspecified

## 2012-11-29 NOTE — Telephone Encounter (Signed)
Pt's son Dorene Sorrow called again in ref to previous messages says he has to leave so call Lynden Ang at [phone].Raylene Everts

## 2012-12-24 ENCOUNTER — Telehealth: Payer: Self-pay | Admitting: Cardiovascular Disease

## 2012-12-24 NOTE — Telephone Encounter (Signed)
Patricia(caregiver)  wants to know if they can get an order for Mr.Maragh  to have his lab work done . Please call her to let her know  [phone]   Thanks

## 2012-12-24 NOTE — Telephone Encounter (Signed)
Returned call.  Spoke w/ Elease Hashimoto, caregiver.  Stated she wanted to know if she can get an order for pt to get his creatinine checked.  Stated pt has been very quiet lately.  Stated since pt has "old timer's " she can only go by how he acts.  Elease Hashimoto informed Dr. Tresa Endo will be notified.  Message forwarded to Dr. Tresa Endo and paper chart placed on cart w/ this note.

## 2013-01-10 ENCOUNTER — Telehealth: Payer: Self-pay | Admitting: *Deleted

## 2013-01-10 DIAGNOSIS — Z79899 Other long term (current) drug therapy: Secondary | ICD-10-CM

## 2013-01-10 LAB — COMPREHENSIVE METABOLIC PANEL WITH GFR
ALT: 14 U/L (ref 0–53)
AST: 26 U/L (ref 0–37)
Albumin: 4.1 g/dL (ref 3.5–5.2)
Alkaline Phosphatase: 54 U/L (ref 39–117)
BUN: 19 mg/dL (ref 6–23)
CO2: 29 meq/L (ref 19–32)
Calcium: 9.2 mg/dL (ref 8.4–10.5)
Chloride: 100 meq/L (ref 96–112)
Creat: 1.85 mg/dL — ABNORMAL HIGH (ref 0.50–1.35)
Glucose, Bld: 85 mg/dL (ref 70–99)
Potassium: 4.2 meq/L (ref 3.5–5.3)
Sodium: 139 meq/L (ref 135–145)
Total Bilirubin: 0.8 mg/dL (ref 0.3–1.2)
Total Protein: 6.6 g/dL (ref 6.0–8.3)

## 2013-01-10 NOTE — Telephone Encounter (Signed)
Caregiver called back to inform me that she has not gotten bloodwork order. She would like to have patients potassium checked. States that he is experiencing lots of leg cramping. Advised her per Dr. Tresa Endo okay to order. Will send lab order downstairs today. Call me on Monday to remind me to pull results for review. Dr. Tresa Endo will only be here in the morning on Monday.

## 2013-01-10 NOTE — Telephone Encounter (Signed)
Left message to return call to inform me whether  she did or did not get the  lab order that she requested. If not, let me know what she wants him to have and I will get it ordered.

## 2013-01-13 ENCOUNTER — Telehealth: Payer: Self-pay | Admitting: *Deleted

## 2013-01-13 NOTE — Telephone Encounter (Signed)
Left message on caregiver Elease Hashimoto) phone that patient's bloodwork that was done on Friday is WNL creat. ^ but better than 2 months ago. Potassium is normal.Cannot explain the reason for his leg cramping. Once Dr. Tresa Endo reviews labs i will call them back with any changes if needed.

## 2013-02-06 ENCOUNTER — Other Ambulatory Visit: Payer: Self-pay | Admitting: Pharmacist Clinician (PhC)/ Clinical Pharmacy Specialist

## 2013-02-06 MED ORDER — ROSUVASTATIN CALCIUM 20 MG PO TABS: 20.0000 mg | ORAL_TABLET | Freq: Every day | ORAL | Status: DC

## 2013-02-18 ENCOUNTER — Other Ambulatory Visit: Payer: Self-pay | Admitting: *Deleted

## 2013-02-18 MED ORDER — ISOSORBIDE MONONITRATE ER 60 MG PO TB24: ORAL_TABLET | ORAL | Status: DC

## 2013-02-26 ENCOUNTER — Telehealth: Payer: Self-pay | Admitting: Cardiovascular Disease

## 2013-02-26 MED ORDER — TORSEMIDE 20 MG PO TABS: 20.0000 mg | ORAL_TABLET | Freq: Every day | ORAL | Status: DC

## 2013-02-26 NOTE — Telephone Encounter (Signed)
Paper chart received and reviewed.  Per last OV note on 4.16.14, Dr. Tresa Endo reduced torsemide from 40 mg daily to 20 mg daily.   Call to [place] and informed pt should be taking 20 mg daily and PRN torsemide was not documented at last OV.  Informed Dr. Tresa Endo will be notified for further instructions and that pt was supposed to see Dr. Tresa Endo this month for 3 month f/u.  [name] asked to speak w/ Dr. Tresa Endo and informed he is not in the office today, but can be notified.  [name] asked to speak w/ Vernona Rieger (NP) since she has seen pt before and informed she is in the office seeing patients and RN will discuss w/ her and call back.  Verbalized understanding and agreed w/ plan.  Discussed w/ L. Annie Paras, NP and advised send in Rx for Torsemide 20 mg daily w/ one extra dose (20 mg) no more than 3 times per week as needed.    Call to [place] and informed.  Refill(s) sent to pharmacy.  Advised to discuss w/ Dr. Tresa Endo at next visit if he would like to continue pt on that dose.  Verbalized understanding and agreed w/ plan.

## 2013-02-26 NOTE — Telephone Encounter (Signed)
Urgent call report that Caregiver needs to speak w/ nurse ASAP.  Call retrieved.  [name], caregiver, stated they are having a problem w/ refilling pt's torsemide.  Stated pt is supposed to be able to take it twice daily and an increased dose PRN and has run out.  Stated pharmacy will not refill it b/c of the directions.  Stated pt's weight is usually 183 lbs and today it is 185 lbs.  Informed pt's chart has been requested and RN will call back once reviewed as orders in Epic state pt to take torsemide one daily.

## 2013-03-10 ENCOUNTER — Encounter: Payer: Self-pay | Admitting: *Deleted

## 2013-03-10 ENCOUNTER — Encounter: Payer: Self-pay | Admitting: Cardiovascular Disease

## 2013-03-11 ENCOUNTER — Ambulatory Visit: Payer: Medicare HMO | Admitting: Cardiovascular Disease

## 2013-03-22 ENCOUNTER — Emergency Department (HOSPITAL_COMMUNITY)
Admission: EM | Admit: 2013-03-22 | Discharge: 2013-03-22 | Disposition: A | Discharge status: Home / Self Care | Payer: Medicare HMO | Attending: Emergency Medicine | Admitting: Emergency Medicine

## 2013-03-22 ENCOUNTER — Emergency Department (HOSPITAL_COMMUNITY): Payer: Medicare HMO

## 2013-03-22 DIAGNOSIS — Z7982 Long term (current) use of aspirin: Secondary | ICD-10-CM | POA: Insufficient documentation

## 2013-03-22 DIAGNOSIS — R197 Diarrhea, unspecified: Secondary | ICD-10-CM

## 2013-03-22 DIAGNOSIS — M1990 Unspecified osteoarthritis, unspecified site: Secondary | ICD-10-CM | POA: Insufficient documentation

## 2013-03-22 DIAGNOSIS — C61 Malignant neoplasm of prostate: Secondary | ICD-10-CM | POA: Insufficient documentation

## 2013-03-22 DIAGNOSIS — K589 Irritable bowel syndrome without diarrhea: Secondary | ICD-10-CM | POA: Insufficient documentation

## 2013-03-22 DIAGNOSIS — F411 Generalized anxiety disorder: Secondary | ICD-10-CM | POA: Insufficient documentation

## 2013-03-22 DIAGNOSIS — I509 Heart failure, unspecified: Secondary | ICD-10-CM | POA: Insufficient documentation

## 2013-03-22 DIAGNOSIS — Z79899 Other long term (current) drug therapy: Secondary | ICD-10-CM | POA: Insufficient documentation

## 2013-03-22 DIAGNOSIS — I2510 Atherosclerotic heart disease of native coronary artery without angina pectoris: Secondary | ICD-10-CM | POA: Insufficient documentation

## 2013-03-22 DIAGNOSIS — I739 Peripheral vascular disease, unspecified: Secondary | ICD-10-CM | POA: Insufficient documentation

## 2013-03-22 DIAGNOSIS — K219 Gastro-esophageal reflux disease without esophagitis: Secondary | ICD-10-CM | POA: Insufficient documentation

## 2013-03-22 DIAGNOSIS — F0390 Unspecified dementia without behavioral disturbance: Secondary | ICD-10-CM | POA: Insufficient documentation

## 2013-03-22 DIAGNOSIS — N289 Disorder of kidney and ureter, unspecified: Secondary | ICD-10-CM | POA: Insufficient documentation

## 2013-03-22 DIAGNOSIS — R609 Edema, unspecified: Secondary | ICD-10-CM | POA: Insufficient documentation

## 2013-03-22 DIAGNOSIS — E860 Dehydration: Secondary | ICD-10-CM | POA: Insufficient documentation

## 2013-03-22 DIAGNOSIS — R5381 Other malaise: Secondary | ICD-10-CM | POA: Insufficient documentation

## 2013-03-22 DIAGNOSIS — I10 Essential (primary) hypertension: Secondary | ICD-10-CM | POA: Insufficient documentation

## 2013-03-22 DIAGNOSIS — R0602 Shortness of breath: Secondary | ICD-10-CM | POA: Insufficient documentation

## 2013-03-22 LAB — CBC WITH DIFFERENTIAL/PLATELET
Basophils Relative: 0 % (ref 0–1)
Eosinophils Absolute: 0.1 10*3/uL (ref 0.0–0.7)
HCT: 38.6 % — ABNORMAL LOW (ref 39.0–52.0)
Hemoglobin: 13.5 g/dL (ref 13.0–17.0)
Lymphs Abs: 2 10*3/uL (ref 0.7–4.0)
MCH: 32.8 pg (ref 26.0–34.0)
MCHC: 35 g/dL (ref 30.0–36.0)
Monocytes Absolute: 1 10*3/uL (ref 0.1–1.0)
Monocytes Relative: 11 % (ref 3–12)
Neutro Abs: 6.1 10*3/uL (ref 1.7–7.7)
Neutrophils Relative %: 66 % (ref 43–77)
RBC: 4.11 MIL/uL — ABNORMAL LOW (ref 4.22–5.81)

## 2013-03-22 LAB — COMPREHENSIVE METABOLIC PANEL
Alkaline Phosphatase: 45 U/L (ref 39–117)
BUN: 21 mg/dL (ref 6–23)
Chloride: 103 mEq/L (ref 96–112)
Creatinine, Ser: 1.67 mg/dL — ABNORMAL HIGH (ref 0.50–1.35)
GFR calc Af Amer: 39 mL/min — ABNORMAL LOW (ref >90)
GFR calc non Af Amer: 34 mL/min — ABNORMAL LOW (ref >90)
Glucose, Bld: 85 mg/dL (ref 70–99)
Potassium: 3.6 mEq/L (ref 3.5–5.1)
Total Bilirubin: 0.7 mg/dL (ref 0.3–1.2)

## 2013-03-22 LAB — URINALYSIS, ROUTINE W REFLEX MICROSCOPIC
Ketones, ur: NEGATIVE mg/dL
Leukocytes, UA: NEGATIVE
Nitrite: NEGATIVE
Protein, ur: NEGATIVE mg/dL
Urobilinogen, UA: 0.2 mg/dL (ref 0.0–1.0)

## 2013-03-22 LAB — PRO B NATRIURETIC PEPTIDE: Pro B Natriuretic peptide (BNP): 554.5 pg/mL — ABNORMAL HIGH (ref 0–450)

## 2013-03-22 MED ORDER — SODIUM CHLORIDE 0.9 % IV SOLN
INTRAVENOUS | Status: DC
  Administered 2013-03-22: 125 mL/h via INTRAVENOUS

## 2013-03-22 MED ORDER — DIPHENOXYLATE-ATROPINE 2.5-0.025 MG PO TABS: 1.0000 | ORAL_TABLET | Freq: Four times a day (QID) | ORAL | Status: DC | PRN

## 2013-03-22 NOTE — ED Notes (Signed)
Pt to department reporting diarrhea for the past week. States that he has tried OTC medication without any relief. Also reports generalized weakness. Pt has not been eating and drinking well.

## 2013-03-22 NOTE — ED Provider Notes (Signed)
CSN: [id number]     Arrival date & time 03/22/13  1242 History     First MD Initiated Contact with Patient 03/22/13 1300     Chief Complaint  Patient presents with  . Diarrhea  . Weakness   (Consider location/radiation/quality/duration/timing/severity/associated sxs/prior Treatment) HPI Comments: Patient brought to the emergency department by family for evaluation of diarrhea and generalized weakness. Patient reports that he started to have watery diarrhea one week ago. He has gone through an entire box of Imodium without any improvement. Diarrhea worsened today. He went to urgent care and they were concerned about dehydration, referred him to the ER. Patient denies abdominal pain. There has not been any rectal bleeding. Patient and family both report that he has been more short of breath than usual today. She has not been able to eat or drink for the last couple of days due to diarrhea.  Patient is a 79 y.o. male presenting with diarrhea and weakness.  Diarrhea Weakness Associated symptoms include shortness of breath. Pertinent negatives include no chest pain.    Past Medical History  Diagnosis Date  . Unspecified hearing loss   . Unspecified essential hypertension   . Coronary atherosclerosis of unspecified type of vessel, native or graft   . Peripheral vascular disease, unspecified   . Unspecified venous (peripheral) insufficiency   . Other and unspecified hyperlipidemia   . Esophageal reflux   . Irritable bowel syndrome   . Unspecified disorder resulting from impaired renal function   . Osteoarthrosis, unspecified whether generalized or localized, unspecified site   . Memory loss   . Anxiety state, unspecified   . CHF (congestive heart failure)   . Malignant neoplasm of prostate   . Dementia   . Anasarca 09/09/2012  . Shortness of breath   . Hx of echocardiogram 2013    EF 55%-65% with grade 1 diatolic dyfunction.  Marland Kitchen History of stress test 07/2012    normal perfusion  without scar or ischemia.  . Renal insufficiency    Past Surgical History  Procedure Laterality Date  . Inguinal hernia repair    . Appendectomy    . Coronary artery bypass graft  1997  . Coronary angioplasty with stent placement     Family History  Problem Relation Age of Onset  . Hypertension Mother   . Heart disease Father    History  Substance Use Topics  . Smoking status: Never Smoker   . Smokeless tobacco: Never Used  . Alcohol Use: No    Review of Systems  Respiratory: Positive for shortness of breath.   Cardiovascular: Negative for chest pain.  Gastrointestinal: Positive for diarrhea. Negative for blood in stool.  Neurological: Positive for weakness.  All other systems reviewed and are negative.    Allergies  Sulfamethoxazole  Home Medications   Current Outpatient Rx  Name  Route  Sig  Dispense  Refill  . acetaminophen (TYLENOL) 650 MG CR tablet   Oral   Take 650 mg by mouth every 8 (eight) hours as needed for pain (for pain).         Marland Kitchen aspirin 81 MG tablet   Oral   Take 81 mg by mouth daily.           . calcium carbonate (TUMS - DOSED IN MG ELEMENTAL CALCIUM) 500 MG chewable tablet   Oral   Chew 2 tablets by mouth 2 (two) times daily.         Marland Kitchen donepezil (ARICEPT) 5 MG tablet  Oral   Take 1 tablet (5 mg total) by mouth at bedtime.   30 tablet   5   . FIBER SELECT GUMMIES CHEW      Chew 1 once daily         . hydrALAZINE (APRESOLINE) 25 MG tablet   Oral   Take 25 mg by mouth 2 (two) times daily.          . isosorbide mononitrate (IMDUR) 60 MG 24 hr tablet      30 mg every morning and 60 mg every evening   45 tablet   6   . ketoconazole (NIZORAL) 2 % cream   Topical   Apply 1 application topically daily. To left side of face, behind ears, and to scalp         . Lansoprazole (PREVACID PO)   Oral   Take 1 capsule by mouth daily.         . metoprolol succinate (TOPROL-XL) 50 MG 24 hr tablet   Oral   Take 50 mg by mouth  daily.          . Multiple Vitamin (MULTIVITAMIN WITH MINERALS) TABS   Oral   Take 2 tablets by mouth daily.         . nitroGLYCERIN (NITROSTAT) 0.4 MG SL tablet   Sublingual   Place 0.4 mg under the tongue every 5 (five) minutes as needed. For chest pain         . potassium chloride SA (K-DUR,KLOR-CON) 20 MEQ tablet   Oral   Take 20 mEq by mouth daily.          . rosuvastatin (CRESTOR) 20 MG tablet   Oral   Take 1 tablet (20 mg total) by mouth daily.   30 tablet   3   . sertraline (ZOLOFT) 25 MG tablet   Oral   Take 1 tablet (25 mg total) by mouth daily.   30 tablet   6   . torsemide (DEMADEX) 20 MG tablet   Oral   Take 1 tablet (20 mg total) by mouth daily. May take extra dose (20mg ) no more than 3 times per week as needed for wt gain   45 tablet   0     Please see updated directions.    BP 132/63  Pulse 65  Temp(Src) 97.7 F (36.5 C) (Oral)  Resp 20  SpO2 98% Physical Exam  Constitutional: He is oriented to person, place, and time. He appears well-developed and well-nourished. No distress.  HENT:  Head: Normocephalic and atraumatic.  Right Ear: Hearing normal.  Left Ear: Hearing normal.  Nose: Nose normal.  Mouth/Throat: Oropharynx is clear and moist and mucous membranes are normal.  Eyes: Conjunctivae and EOM are normal. Pupils are equal, round, and reactive to light.  Neck: Normal range of motion. Neck supple.  Cardiovascular: Regular rhythm, S1 normal and S2 normal.  Exam reveals no gallop and no friction rub.   No murmur heard. Pulmonary/Chest: Effort normal and breath sounds normal. No respiratory distress. He exhibits no tenderness.  Abdominal: Soft. Normal appearance and bowel sounds are normal. There is no hepatosplenomegaly. There is no tenderness. There is no rebound, no guarding, no tenderness at McBurney's point and negative Murphy's sign. No hernia.  Musculoskeletal: Normal range of motion.  Neurological: He is alert and oriented to  person, place, and time. He has normal strength. No cranial nerve deficit or sensory deficit. Coordination normal. GCS eye subscore is 4. GCS verbal subscore is  5. GCS motor subscore is 6.  Skin: Skin is warm, dry and intact. No rash noted. No cyanosis.  Psychiatric: He has a normal mood and affect. His speech is normal and behavior is normal. Thought content normal.    ED Course   Procedures (including critical care time)  EKG:  Date: 03/22/2013  Rate: 57  Rhythm: normal sinus rhythm and premature ventricular contractions (PVC)  QRS Axis: right  Intervals: normal  ST/T Wave abnormalities: normal  Conduction Disutrbances:none  Narrative Interpretation:   Old EKG Reviewed: axis change, possible due to lead reversal    Labs Reviewed  CBC WITH DIFFERENTIAL - Abnormal; Notable for the following:    RBC 4.11 (*)    HCT 38.6 (*)    All other components within normal limits  COMPREHENSIVE METABOLIC PANEL - Abnormal; Notable for the following:    Creatinine, Ser 1.67 (*)    GFR calc non Af Amer 34 (*)    GFR calc Af Amer 39 (*)    All other components within normal limits  PRO B NATRIURETIC PEPTIDE - Abnormal; Notable for the following:    Pro B Natriuretic peptide (BNP) 554.5 (*)    All other components within normal limits  STOOL CULTURE  CLOSTRIDIUM DIFFICILE BY PCR  TROPONIN I  URINALYSIS, ROUTINE W REFLEX MICROSCOPIC   Dg Abd Acute W/chest  03/22/2013   *RADIOLOGY REPORT*  Clinical Data: Diarrhea, weakness, shortness of breath, history CABG, unspecified essential hypertension, peripheral vascular disease, CHF, prostate cancer  ACUTE ABDOMEN SERIES (ABDOMEN 2 VIEW & CHEST 1 VIEW)  Comparison: Chest and abdominal radiographs of 09/09/2012  Findings: Normal heart size post CABG. Calcified tortuous aorta. Pulmonary vascularity and mediastinal contours otherwise normal. Minimal chronic atelectasis at right costophrenic angle. Lungs otherwise clear. No pleural effusion or  pneumothorax. Calcified pleural plaques at the lower left chest again noted.  Nonobstructive bowel gas pattern. No bowel dilatation or bowel wall thickening or free intraperitoneal air. Brachytherapy seed implants at prostate bed. No urinary tract calcification. Degenerative disc facet disease changes of the thoracolumbar spine with dextroconvex scoliosis.  IMPRESSION: No acute abnormalities.   Original Report Authenticated By: Ulyses Southward, M.D.   As: 1. Diarrhea 2. Mild dehydration  MDM  She presents to the ER for evaluation of repeated episodes of diarrhea over a period of one week. He has been trying to treat his symptoms with over-the-counter medication but has continued to have diarrhea. Patient has not had any recent antibiotic use. Abdominal exam is benign. He is not expressing any pain. He reports that he had some shortness of breath earlier, but that was concomitant with his weakness. He has a history of renal insufficiency which is improved over his baseline. He has a history of congestive heart failure, no active disease currently. Patient given a 500 mL IV fluid bolus, orthostatic vital signs are normal. All blood work is normal including electrolytes. There is nothing to warrant admission at this time. This was explained to the family. He discharge, use Lomotil sparingly over the course of the weekend and followup with primary doctor Monday.  Attempts were made to get a stool culture and C. difficile test. Unfortunately, patient's symptoms mixed with urine, but nurse reports it was formed stool, and therefore C. difficile is very unlikely.   Gilda Crease, MD 03/22/13 [phone]

## 2013-03-24 ENCOUNTER — Encounter: Payer: Self-pay | Admitting: Pulmonary Disease

## 2013-03-24 ENCOUNTER — Telehealth: Payer: Self-pay | Admitting: Gastroenterology

## 2013-03-24 ENCOUNTER — Ambulatory Visit (INDEPENDENT_AMBULATORY_CARE_PROVIDER_SITE_OTHER): Payer: Medicare HMO | Admitting: Pulmonary Disease

## 2013-03-24 VITALS — BP 122/60 | HR 80 | Temp 97.8°F | Ht 72.0 in | Wt 188.4 lb

## 2013-03-24 DIAGNOSIS — I739 Peripheral vascular disease, unspecified: Secondary | ICD-10-CM

## 2013-03-24 DIAGNOSIS — N259 Disorder resulting from impaired renal tubular function, unspecified: Secondary | ICD-10-CM

## 2013-03-24 DIAGNOSIS — C61 Malignant neoplasm of prostate: Secondary | ICD-10-CM

## 2013-03-24 DIAGNOSIS — E785 Hyperlipidemia, unspecified: Secondary | ICD-10-CM

## 2013-03-24 DIAGNOSIS — K589 Irritable bowel syndrome without diarrhea: Secondary | ICD-10-CM

## 2013-03-24 DIAGNOSIS — M1990 Unspecified osteoarthritis, unspecified site: Secondary | ICD-10-CM

## 2013-03-24 DIAGNOSIS — R413 Other amnesia: Secondary | ICD-10-CM

## 2013-03-24 DIAGNOSIS — I10 Essential (primary) hypertension: Secondary | ICD-10-CM

## 2013-03-24 DIAGNOSIS — R197 Diarrhea, unspecified: Secondary | ICD-10-CM

## 2013-03-24 DIAGNOSIS — R109 Unspecified abdominal pain: Secondary | ICD-10-CM

## 2013-03-24 DIAGNOSIS — I2510 Atherosclerotic heart disease of native coronary artery without angina pectoris: Secondary | ICD-10-CM

## 2013-03-24 DIAGNOSIS — K219 Gastro-esophageal reflux disease without esophagitis: Secondary | ICD-10-CM

## 2013-03-24 MED ORDER — CHOLESTYRAMINE 4 G PO PACK: PACK | ORAL | Status: DC

## 2013-03-24 NOTE — Telephone Encounter (Signed)
Left message for Billy Morgan to call back.

## 2013-03-24 NOTE — Patient Instructions (Addendum)
Today we updated your med list in our EPIC system...    Continue your current medications the same...  We added QUESTRAN Packets- start w/ 1/2 packet in water, juice or apple sauce daily...  We will arrange for a GI appt w/ DrKaplan's team ASAP...  We reviewed your ER records and XRays, Blood & stool tests today...  Call for any questions...  Lets plan a follow up appt in 4-6 weeks.Marland KitchenMarland Kitchen

## 2013-03-24 NOTE — Progress Notes (Signed)
Subjective:    Patient ID: Billy Morgan, male    DOB: January 26, 1918, 79 y.o.   MRN: [id number]  HPI [name] here for a follow up visit... he has multiple medical problems as noted below...   ~  August 16, 2010:  16 month ROV & review> he is quite remarkable for 79 y/o- still lives in own home & cares for wife w/ help of CNA's that they hire in shifts, his son supervises everyting but confirms that he is doing satis... pt has no new complaints or concerns... +HOH;  BP controlled on meds;  denies CP, palpit, SOB, edema, etc;  Chol looks good on Simva40;  GI & GU are stable;  DJD is mod & well managed by the diclofenac...   ~  January 23, 2011:  44mo ROV & he continues to do remarkably well for 89, no new complaints or concerns; he remains active, in his own home w/ caregivers helping daily, family nearby & on his own at night w/o problems identified;  Wife passed away 12-08-2022 on home hospice after hosp 3/12 w/ stroke etc.    Followed by Howell Rucks for [facility], seen 2/12 & his note reviewed; Myoview was neg- no scar or ischemia & EF=74%; no changes made...  ~  August 02, 2011:  6mo ROV & Billy Morgan is c/o URI "stopped up" he says; notes "chest cold" x 3d w/ chest congestion, "tight", mild cough, yellow phlegm, hoarse; denies f/c/s, SOB, edema; he had Flu vaccine 11/12;  We discussed RX w/ Depo120, ZPak, Mucinex, Fluids, Tylenol, etc...    He reports going to ER 9/12 w/ left flank pain per ER notes reviewed> Exam was neg; Urine was clear; Labs were essentially wnl (K 3.4, Creat 1.5, WBC 10.5); CT Abd done & no acute prob ident (see below for mult incidental findings in the 79 y/o man); pain was felt to be musculoskeletal & given Vicodin> he improved over time & resolved...     HBP> on MetoprololER50-1/2Bid, Felodipine10, Demadex20- DrKelly incr to 2/d & extra if needed; BP= 118/60 & they bring an extensive BP log but ave pressures are ok & he denies CP, palpit, dizzy, syncope, SOB; they report some edema which prompted  drKelly to incr the diuretic but Creat up to 2.0 today & cautioned to minimize the diuretic- avoid sodium, elev legs, wear support hose...    CAD, s/p CABG x5 in 1997> on ASA81+ above meds, followed by [facility] & seen 10/12, stable & no changes made; son tells me he was recently seen w/ incr edema & weight (wt today= 190# up 11#)==> told to increase the Demadex & we will fax copies of labs to [facility] (note BNP today= 229)...    PAD> on ASA81 & followed by [facility]; prev sonar & CTAbd revealed extensive atherosclerotic changes, tortuous Ao & iliacs, no aneurysm...    Hyperlipid> on Crestor20 now; prev on Simva40 & ?changed by [facility]?; last FLP on Simva 1/12 as noted below, not fasting today...    GI> GERD, IBS> on Prilosec OTC prn & Miralax prn; they deny GI issues at present...    GU> hx prostate ca & renal insuffic> treated w/ radium seed implants in 2000; slowly rising PSA noted but we don't have notes from Urology [facility]); ?when he was last seen?  As noted he has mild renal insuffic w/ baseline Creat ~1.5-1.6 but labs today 2.0 after incr in diuretic; asked to decr Demadex back to prev level & caution w/ NSAIDs.Marland KitchenMarland Kitchen  DJD> on Voltaren 50Bid as needed & asked to minimize the NSAID rx, use Tylenol first...    Memory loss/ anxiety> on Xanax0.25 prn; he manages pretty well for 89 w/ son's help...  ~  November 13, 2012:  88mo ROV & Billy Morgan has been going downhill- saw TP 11/09/12 w/ family noting memory loss, confusion, agitation, etc & now required 24h care ([place]); they tried [facility] but they state INTOL (too sedated even w/ 1/2 tab); per care giver he is depressed, tearful, and we decided to try Sertraline 25=>50mg /d... He is followed very freq by DrKelly/ [facility] & was [facility] for CHF in Feb2014, diuresed w/ mult diuretics, and he notes labs done every week by [facility] & he doesn't want additional lab work done here...     HBP> on MetoprololER25Qhs, Apres25Tid, Demadex20-2Bid, K20; BP= 104/64 & he  denies CP, palpit, dizzy, syncope, SOB; edema managed by [facility] w/ freq adjustments in his diuretic regimen...    CAD, s/p CABG x5 in 1997, CHF> on ASA81+ Imdur-30am&60pm, followed by [facility] & he was hosp 2/14 w/ CHF (mostly diastolic) & since then he has had freq (wkly) lab draws at [facility] office...     PAD> on ASA81 & followed by [facility]; prev sonar & CTAbd revealed extensive atherosclerotic changes, tortuous Ao & iliacs, no aneurysm...    Hyperlipid> on Crestor20 now; prev on Simva40 & ?changed by [facility]?; last FLP on Simva 1/12 as noted below, not fasting today...    GI> GERD, IBS> on Prilosec OTC prn & Miralax prn; they deny GI issues at present...    GU> hx prostate ca & renal insuffic> treated w/ radium seed implants in 2000; slowly rising PSA noted but we don't have notes from [facility]); ?when he was last seen?  As noted he has mild renal insuffic w/ baseline Creat ~1.5-1.6 but labs recently 2.2 after incr in diuretic; asked to decr Demadex back to prev level & caution w/ NSAIDs...    DJD> on Voltaren 50Bid as needed & asked to minimize the NSAID rx, use Tylenol first...    Memory loss/ anxiety/ depression> on Aricept5, Xanax0.25 prn; using Melatonin OTC for sleep (it helps); they note INTL Seroquel- we will try Sertraline 25/d... We reviewed prob list, meds, xrays and labs> see below for updates >>  CT Head 4/14 showed sm vessel dis & global atrophy, no acute changes...  LABS here 4/14:  Chems- ok x BUN=24, Cr=2.2 (up from 1.6 in hosp 2/14);  CBC- wnl x WBC=13.4;  TSH=0.72;  VitB12=293;  RPR=neg;  UA- clear...   ~  March 24, 2013:  54mo ROV & add-on for diarrhea>  Billy Morgan has had diarrhea for 1-2 weeks, started gradually, now going 3-4 times daily w/ soft mushy brown stool, not watery/ not bloody/ denies abd pain or cramping;  He has Hx GERD on Prev30 daily and IBS not on meds (prev took Miralax);  He does have some abd distention & incr gas, denies pain, n/v, blood seen;  Prev imaging  studies>  AbdXRay 03/22/13 showed nonobstructive bowel gas pattern, brachytherapy seeds in prostate, DJD & scoliosis of spine;  AbdSonar 09/11/12 showed 4mm GB polyp, atheromatous calcif in Ao, ?hepatic steatosis;  CTAbd&Pelvis 12/13 showed chr right basilar pleural thickening & calcif, brachytherapy seeds in prostate, diverticulosis, NAD...  His exam shows distended abd w/ incr BS but soft, nontender, w/o masses detected...  We discussed Rx w/ Questran starting 1/2 packet per day (in water, juice, or apple sauce), they  are already using Lomotil, and refer to GI- DrKaplan et al last colon was 8/07 showing divertics only...    We reviewed prob list, meds, xrays and labs> see below for updates >>  LABS 03/22/13 in ER>  UA= clear;  Stool CDiff- neg;  CBC- wnl;  Chems- ok x Cr=1.67 (improved);  BNP=555...          Problem List:   HEARING LOSS (ICD-389.9) - he has hearing aides...  HYPERTENSION (ICD-401.9) - on METOPROLOL 50mg - taking 1/2Bid,  FELODIPINE 10mg /d,  TORSEMIDE 20mg - increased by [facility] to 2-4 daily ...  ~  6/12: BP= 128/62 & similar at home, takes meds regularly & tolerates well... denies HA, visual changes, CP, palipit, dizziness, syncope, dyspnea, edema, etc...  ~  12/12: BP= 118/60 & DrKelly recently increased Demadex due to swelling & wt gain; Creat up to 2.0 & asked to decr the demadex to prev level (?2daily). ~  CXR 2/14 showed heart at upper lim for size, prior CABG, calcif pleural plaques at left base, bibasilar scarring, NAD...  ATHEROSCLEROTIC HEART DISEASE (ICD-414.00) - on ASA 81mg /d; followed by Howell Rucks for Cards> he had CABG x5 in 1997 by DrBartle; denies CP/ angina, palpit, SOB, etc... ~  NuclearStressTest 7/07 was neg- no ischemia or infarct... non-gated due to ectopy. ~  2DEcho 11/07 showed norm LV wall thickness & LVF, mild RA & LA dil, mild MR... ~  Myoview 2/12 was neg> no scar or ischemia, & EF=74%... ~  10/12: DrKelly's note indicates new T-inversions in III & avF not  seen before, pt asymptomatic & they are following. ~  EKG 2/14 showed NSR, rate69, 1st degree AVB, otherw wnl... ~  [facility] 2/14 for diastolic heart failure> diuresed & meds adjusted- disch on Demadex20-2Bid + Plavix added (?this was later stopped by [facility]?)... ~  2/14:  DrKelly/ [facility] saw pt> post hosp for CHF & diuresed- trying Lasix, Demadex, Zaroxyln; known CAD, RI, right heart failure; pt states they do lab work on him every week & he doesn't want me checking this as well...  PERIPHERAL VASCULAR DISEASE (ICD-443.9) - on ASA 81mg /d and followed by [facility] as noted... ~  Abd Sonar 5/01 showed tortuous Ao w/ mod atherosclerotic changes, no aneurysm. ~  CT Abd 9/12 via ER for flank pain showed incidental finding of extensive atherosclerotic changes in Ao & iliacs, no aneurysm.  VENOUS INSUFFICIENCY (ICD-459.81)  HYPERLIPIDEMIA (ICD-272.4) - prev on Simva40, now lists CRESTOR 20mg /d (?changed by [facility]?). ~  FLP 4/07 on Simva20 showed TChol 123, TG 62, HDL 35, LDL 76... rec- continue same. ~  FLP 10/09 on Simva20 showed TChol 156, TG 84, HDL 36, LDL 103 ~  FLP 9//10 on Simva20 showed TChol 227, TG 79, HDL 36, LDL 176... ?taking regularly? incr Simva40. ~  FLP 1/12 on Simva40 showed TChol 164, TG 108, HDL 38, LDL 104 ~  1/13: Pt was changed to Cres20 in the interval (?by [facility]), not fasting today for lipid profile...  GERD (ICD-530.81) - uses OTC meds Prn... denies nausea, vomiting, heartburn, diarrhea, constipation, blood in stool, abdominal pain, swelling, gas...  ~  Abd sonar 2/14 showed ?hepatic steatosis, 3mm polyp in GB- no stones or wall thickening etc; calcif in Abd Ao...  IRRITABLE BOWEL SYNDROME (ICD-564.1) - on MIRALAX Prn... ~  last colonoscopy 8/07 by Dr Arlyce Dice showed divertics only... ~  8/14: presented w/ diarrhea of 2wk duration, went to the ER for eval- then here- exam showed distended abd w/ incr BS but soft, nontender,  w/o masses detected...  We discussed Rx w/ Questran starting  1/2 packet per day (in water, juice, or apple sauce), they are already using Lomotil, and refer to GI- DrKaplan et al last colon was 8/07 showing divertics only...  AbdXRay 03/22/13 showed nonobstructive bowel gas pattern, brachytherapy seeds in prostate, DJD & scoliosis of spine.   AbdSonar 09/11/12 showed 3mm GB polyp, atheromatous calcif in Ao, ?hepatic steatosis.  CTAbd&Pelvis 12/13 showed chr right basilar pleural thickening & calcif, brachytherapy seeds in prostate, diverticulosis, NAD.   PROSTATE CANCER (ICD-185) & RENAL INSUFFICIENCY (ICD-588.9) - he had radium seed implants 7/00 & is followed by Joneen Boers- we don't have any recent notes... ~  labs here 8/06 showed PSA = 0.11 ~  labs 10/09 showed PSA= 0.73... BUN= 18, Creat= 1.2 ~  labs 9/10 showed PSA= 1.10... slowly increasing PSA may mean recurrent Ca- rec> f/u w/ Urology. ~  labs 1/12 showed PSA= 1.23... BUN= 22, Creat= 1.6 ~  Labs 1/13 showed PSA= 1.72... BUN= 25, Creat= 2.0.Marland KitchenMarland Kitchen Rec> decr Demadex back to prev dose (1-2 daily), minimize NSAIDs & needs f/u Urology.  DEGENERATIVE JOINT DISEASE (ICD-715.90) - on DICLOFENAC 50mg Bid.. he sees DrAplington/ Contract[facility] at [employer]...  MEMORY LOSS (ICD-780.93) - he notes prob w/ memory... "I don't drive at night" (he got lost and son had to get him)... MMSE= 28/30 last yr... ~  CT Brain 4/08 showed atrophy & sm vessel dis, no acute changes... ~  CT Head 4/14 showed sm vessel dis & global atrophy, no acute changes...  ANXIETY (ICD-300.00) - on ALPRAZOLAM 0.25mg  Prn...   Past Surgical History  Procedure Laterality Date  . Inguinal hernia repair    . Appendectomy    . Coronary artery bypass graft  1997  . Coronary angioplasty with stent placement      Outpatient Encounter Prescriptions as of 03/24/2013  Medication Sig Dispense Refill  . acetaminophen (TYLENOL) 650 MG CR tablet Take 650 mg by mouth every 8 (eight) hours as needed for pain (for pain).      Marland Kitchen aspirin 81 MG tablet Take 81  mg by mouth daily.        . calcium carbonate (TUMS - DOSED IN MG ELEMENTAL CALCIUM) 500 MG chewable tablet Chew 2 tablets by mouth 2 (two) times daily.      . diphenoxylate-atropine (LOMOTIL) 2.5-0.025 MG per tablet Take 1 tablet by mouth 4 (four) times daily as needed for diarrhea or loose stools.  30 tablet  0  . donepezil (ARICEPT) 5 MG tablet Take 1 tablet (5 mg total) by mouth at bedtime.  30 tablet  5  . FIBER SELECT GUMMIES CHEW Chew 1 once daily      . hydrALAZINE (APRESOLINE) 25 MG tablet Take 25 mg by mouth 2 (two) times daily.       . isosorbide mononitrate (IMDUR) 60 MG 24 hr tablet 30 mg every morning and 60 mg every evening  45 tablet  6  . ketoconazole (NIZORAL) 2 % cream Apply 1 application topically daily. To left side of face, behind ears, and to scalp      . Lansoprazole (PREVACID PO) Take 1 capsule by mouth daily.      . metoprolol succinate (TOPROL-XL) 50 MG 24 hr tablet Take 50 mg by mouth daily.       . Multiple Vitamin (MULTIVITAMIN WITH MINERALS) TABS Take 2 tablets by mouth daily.      . nitroGLYCERIN (NITROSTAT) 0.4 MG SL tablet Place 0.4 mg under the  tongue every 5 (five) minutes as needed. For chest pain      . potassium chloride SA (K-DUR,KLOR-CON) 20 MEQ tablet Take 20 mEq by mouth daily.       . rosuvastatin (CRESTOR) 20 MG tablet Take 1 tablet (20 mg total) by mouth daily.  30 tablet  3  . sertraline (ZOLOFT) 25 MG tablet Take 1 tablet (25 mg total) by mouth daily.  30 tablet  6  . torsemide (DEMADEX) 20 MG tablet Take 1 tablet (20 mg total) by mouth daily. May take extra dose (20mg ) no more than 3 times per week as needed for wt gain  45 tablet  0   No facility-administered encounter medications on file as of 03/24/2013.    Allergies  Allergen Reactions  . Sulfamethoxazole     REACTION: unspecified    Current Medications, Allergies, Past Medical History, Past Surgical History, Family History, and Social History were reviewed in [employer] record.    Review of Systems       See HPI - all other systems neg except as noted...      The patient complains of recent URI/ "cold" 1/13 + malaise, decreased hearing, dyspnea on exertion, constipation, nocturia, joint pain, stiffness, arthritis, and anxiety.  The patient denies fever, chills, sweats, anorexia, fatigue, weakness, weight loss, sleep disorder, blurring, diplopia, eye irritation, eye discharge, vision loss, eye pain, photophobia, earache, ear discharge, tinnitus, nosebleeds, sore throat, chest pain, palpitations, syncope, orthopnea, PND, peripheral edema, dyspnea at rest, excessive sputum, hemoptysis, wheezing, pleurisy, nausea, vomiting, diarrhea, abdominal pain, melena, hematochezia, jaundice, gas/bloating, indigestion/heartburn, dysphagia, odynophagia, dysuria, hematuria, urinary frequency, urinary hesitancy, incontinence, back pain, joint swelling, muscle cramps, muscle weakness, sciatica, restless legs, leg pain at night, leg pain with exertion, rash, itching, dryness, suspicious lesions, paralysis, paresthesias, seizures, tremors, vertigo, transient blindness, frequent falls, frequent headaches, difficulty walking, depression, memory loss, confusion, cold intolerance, heat intolerance, polydipsia, polyphagia, polyuria, unusual weight change, abnormal bruising, bleeding, enlarged lymph nodes, urticaria, allergic rash, hay fever, and recurrent infections.     Objective:   Physical Exam     WD, WN, [name] in NAD... GENERAL:  Alert & oriented; pleasant & cooperative... HEENT:  [place]/AT, EOM-wnl, PERRLA, EACs-clear, TMs-wnl, NOSE-sl congestion, THROAT-clear w/o exud... NECK:  Supple w/ fairROM; no JVD; normal carotid impulses w/o bruits; no thyromegaly or nodules palpated; no lymphadenopathy. CHEST:  Few scat rhonchi & wheezes; no rales or signs of consolidation... HEART:  Regular Rhythm; Gr 1/6 SEM w/o rubs or gallops. ABDOMEN:  Soft, distended, w/ increased bowel sounds;  non-tender, no organomegaly or masses detected. EXT: without deformities, mild arthritic changes; no varicose veins/ +venous insuffic/ tr edema. NEURO:  CN's intact;  no focal neuro deficits... DERM:  No lesions noted; no rash etc...  RADIOLOGY DATA:  Reviewed in the EPIC EMR & discussed w/ the patient...  LABORATORY DATA:  Reviewed in the EPIC EMR & discussed w/ the patient...   Assessment & Plan:    Diarrhea, Abd distention>> ?etiology, eval as noted above, we decided to try questran 1/2 packet/d to start & refer to GI- DrKaplan et al...   HBP>  Controlled on Metop, Apres, Torsemide> continue same & close f/u w/ DrKelly...  ASHD>  Followed by Howell Rucks & stable on ASA + above rx; neg Myoview 2/12, rec to continue to stay active etc...  ASPVD>  Aware, no signif claud symptoms & asked to remain as active as poss...  CHOL>  On Cres20 & stable, continue  same...  GI>  GERD/  IBS>  Followed by Dorris Singh; prev stable w/ Miralax prn; presented 8/14 w/ diarrhea x 1-2 weeks & went to ER=> refer back to GI...  Prostate Cancer & mild Renal Insuffic>  Very slowly rising PSA s/p radium seeds 2000 & he is followed by Urology but we don't have notes...  DJD>  Aware, he uses Voltaren prn...  Memory Loss/ Depression>  Aware- try Zoloft25 to start...   Patient's Medications  New Prescriptions   CHOLESTYRAMINE (QUESTRAN) 4 G PACKET    Take 1/2 packet daily in water, juice or apple sauce daily  Previous Medications   ACETAMINOPHEN (TYLENOL) 650 MG CR TABLET    Take 650 mg by mouth every 8 (eight) hours as needed for pain (for pain).   ASPIRIN 81 MG TABLET    Take 81 mg by mouth daily.     CALCIUM CARBONATE (TUMS - DOSED IN MG ELEMENTAL CALCIUM) 500 MG CHEWABLE TABLET    Chew 2 tablets by mouth 2 (two) times daily.   DIPHENOXYLATE-ATROPINE (LOMOTIL) 2.5-0.025 MG PER TABLET    Take 1 tablet by mouth 4 (four) times daily as needed for diarrhea or loose stools.   DONEPEZIL (ARICEPT) 5 MG TABLET     Take 1 tablet (5 mg total) by mouth at bedtime.   FIBER SELECT GUMMIES CHEW    Chew 1 once daily   HYDRALAZINE (APRESOLINE) 25 MG TABLET    Take 25 mg by mouth 2 (two) times daily.    ISOSORBIDE MONONITRATE (IMDUR) 60 MG 24 HR TABLET    30 mg every morning and 60 mg every evening   KETOCONAZOLE (NIZORAL) 2 % CREAM    Apply 1 application topically daily. To left side of face, behind ears, and to scalp   LANSOPRAZOLE (PREVACID PO)    Take 1 capsule by mouth daily.   METOPROLOL SUCCINATE (TOPROL-XL) 50 MG 24 HR TABLET    Take 50 mg by mouth daily.    MULTIPLE VITAMIN (MULTIVITAMIN WITH MINERALS) TABS    Take 2 tablets by mouth daily.   NITROGLYCERIN (NITROSTAT) 0.4 MG SL TABLET    Place 0.4 mg under the tongue every 5 (five) minutes as needed. For chest pain   POTASSIUM CHLORIDE SA (K-DUR,KLOR-CON) 20 MEQ TABLET    Take 20 mEq by mouth daily.    ROSUVASTATIN (CRESTOR) 20 MG TABLET    Take 1 tablet (20 mg total) by mouth daily.   SERTRALINE (ZOLOFT) 25 MG TABLET    Take 1 tablet (25 mg total) by mouth daily.   TORSEMIDE (DEMADEX) 20 MG TABLET    Take 1 tablet (20 mg total) by mouth daily. May take extra dose (20mg ) no more than 3 times per week as needed for wt gain  Modified Medications   No medications on file  Discontinued Medications   No medications on file

## 2013-03-25 ENCOUNTER — Ambulatory Visit (INDEPENDENT_AMBULATORY_CARE_PROVIDER_SITE_OTHER): Payer: Medicare HMO | Admitting: Nurse Practitioner

## 2013-03-25 ENCOUNTER — Encounter: Payer: Self-pay | Admitting: Nurse Practitioner

## 2013-03-25 VITALS — BP 130/60 | HR 70 | Ht 72.0 in | Wt 184.2 lb

## 2013-03-25 DIAGNOSIS — K219 Gastro-esophageal reflux disease without esophagitis: Secondary | ICD-10-CM

## 2013-03-25 DIAGNOSIS — R198 Other specified symptoms and signs involving the digestive system and abdomen: Secondary | ICD-10-CM

## 2013-03-25 DIAGNOSIS — R197 Diarrhea, unspecified: Secondary | ICD-10-CM

## 2013-03-25 DIAGNOSIS — R194 Change in bowel habit: Secondary | ICD-10-CM

## 2013-03-25 MED ORDER — MOVIPREP 100 G PO SOLR: ORAL | Status: DC

## 2013-03-25 MED ORDER — METRONIDAZOLE 500 MG PO TABS: 500.0000 mg | ORAL_TABLET | Freq: Three times a day (TID) | ORAL | Status: DC

## 2013-03-25 NOTE — Patient Instructions (Addendum)
You have been scheduled for a colonoscopy with propofol. Please follow written instructions given to you at your visit today.  Please pick up your prep kit at the pharmacy within the next 1-3 days. If you use inhalers (even only as needed), please bring them with you on the day of your procedure. Your physician has requested that you go to [email or web address] and enter the access code given to you at your visit today. This web site gives a general overview about your procedure. However, you should still follow specific instructions given to you by our office regarding your preparation for the procedure.  We have sent the following medications to your pharmacy for you to pick up at your convenience:  Flagyl 500 mg take 1 tablet three times a day for 10 days. Moviprep; you were given instructions today at your office visit.  You have been given a GERD handout                                               We are excited to introduce MyChart, a new best-in-class service that provides you online access to important information in your electronic medical record. We want to make it easier for you to view your health information - all in one secure location - when and where you need it. We expect MyChart will enhance the quality of care and service we provide.  When you register for MyChart, you can:    View your test results.    Request appointments and receive appointment reminders via email.    Request medication renewals.    View your medical history, allergies, medications and immunizations.    Communicate with your physician's office through a password-protected site.    Conveniently print information such as your medication lists.  To find out if MyChart is right for you, please talk to a member of our clinical staff today. We will gladly answer your questions about this free health and wellness tool.  If you are age 77 or older and want a member of your family to have access to your  record, you must provide written consent by completing a proxy form available at our office. Please speak to our clinical staff about guidelines regarding accounts for patients younger than age 38.  As you activate your MyChart account and need any technical assistance, please call the MyChart technical support line at [phone] 83-[phone]) or email your question to [email or web address]. If you email your question(s), please include your name, a return phone number and the best time to reach you.  If you have non-urgent health-related questions, you can send a message to our office through MyChart at [email or web address]. If you have a medical emergency, call 911.  Thank you for using MyChart as your new health and wellness resource!   MyChart licensed from [employer],  [id number]. Patents Pending.

## 2013-03-25 NOTE — Telephone Encounter (Signed)
Pt has been having diarrhea for several weeks. Requesting appt sooner than 1st available. Pt scheduled to see Billy Cluster NP today at 11am. Billy Morgan aware of appt date and time.

## 2013-03-25 NOTE — Progress Notes (Signed)
HPI :   Patient is a 79 year old male with multiple medical problems on multiple medications . Patient is new to this practice, referred by Dr. Kriste Basque after being seen in emergency department 3 days ago for diarrhea and weakness.  Plain abdominal films showed unremarkable bowel gas pattern. C-diff and stool culture negative. WBC normal.  Patient was given an IV fluid bolus for dehydration. He was given Lomotil and Questran at discharge which so far hasn't helped.  Patient's caregiver is here and helps with history as patient is hard of hearing. Bowel changes started two weeks ago. Stools unformed (puree consistency). Caregiver describes stools as low volume, malodorous occuring 4-5 times a day, sometimes nocturnally . Diarrhea is unusual for patient. Prior to onset of diarrhea he took Miralax and Fiber for chronic constipation.  No abdominal pain, fever, or blood in stool. No vomiting. No recent medication changes. No recent antibiotics. His weight fluctuates secondary to heart failure. Per caregiver, patient may have had a colonoscopy in the 1990's, none since.   Patient complains of heartburn. He has been on a daily PPI for years but still requires Tums at night.   Past Medical History  Diagnosis Date  . Unspecified hearing loss   . Unspecified essential hypertension   . Coronary atherosclerosis of unspecified type of vessel, native or graft   . Peripheral vascular disease, unspecified   . Unspecified venous (peripheral) insufficiency   . Other and unspecified hyperlipidemia   . Esophageal reflux   . Irritable bowel syndrome   . Unspecified disorder resulting from impaired renal function   . Osteoarthrosis, unspecified whether generalized or localized, unspecified site   . Memory loss   . Anxiety state, unspecified   . CHF (congestive heart failure)   . Malignant neoplasm of prostate   . Dementia   . Anasarca 09/09/2012  . Hx of echocardiogram 2013    EF 55%-65% with grade 1 diatolic  dyfunction.  Marland Kitchen History of stress test 07/2012    normal perfusion without scar or ischemia.  . Renal insufficiency    Family History  Problem Relation Age of Onset  . Hypertension Mother   . Heart disease Father   . Colon cancer Neg Hx    History  Substance Use Topics  . Smoking status: Never Smoker   . Smokeless tobacco: Never Used  . Alcohol Use: No   Current Outpatient Prescriptions  Medication Sig Dispense Refill  . acetaminophen (TYLENOL) 650 MG CR tablet Take 650 mg by mouth every 8 (eight) hours as needed for pain (for pain).      Marland Kitchen aspirin 81 MG tablet Take 81 mg by mouth daily.        . calcium carbonate (TUMS - DOSED IN MG ELEMENTAL CALCIUM) 500 MG chewable tablet Chew 2 tablets by mouth 2 (two) times daily.      . cholestyramine (QUESTRAN) 4 G packet Take 1/2 packet daily in water, juice or apple sauce daily  30 each  2  . diphenoxylate-atropine (LOMOTIL) 2.5-0.025 MG per tablet Take 1 tablet by mouth 4 (four) times daily as needed for diarrhea or loose stools.  30 tablet  0  . donepezil (ARICEPT) 5 MG tablet Take 1 tablet (5 mg total) by mouth at bedtime.  30 tablet  5  . FIBER SELECT GUMMIES CHEW Chew 1 once daily      . hydrALAZINE (APRESOLINE) 25 MG tablet Take 25 mg by mouth 2 (two) times daily.       Marland Kitchen  isosorbide mononitrate (IMDUR) 60 MG 24 hr tablet 30 mg every morning and 60 mg every evening  45 tablet  6  . ketoconazole (NIZORAL) 2 % cream Apply 1 application topically daily. To left side of face, behind ears, and to scalp      . Lansoprazole (PREVACID PO) Take 1 capsule by mouth daily.      . metoprolol succinate (TOPROL-XL) 50 MG 24 hr tablet Take 50 mg by mouth daily.       . Multiple Vitamin (MULTIVITAMIN WITH MINERALS) TABS Take 2 tablets by mouth daily.      . nitroGLYCERIN (NITROSTAT) 0.4 MG SL tablet Place 0.4 mg under the tongue every 5 (five) minutes as needed. For chest pain      . potassium chloride SA (K-DUR,KLOR-CON) 20 MEQ tablet Take 20 mEq by  mouth daily.       . rosuvastatin (CRESTOR) 20 MG tablet Take 1 tablet (20 mg total) by mouth daily.  30 tablet  3  . sertraline (ZOLOFT) 25 MG tablet Take 1 tablet (25 mg total) by mouth daily.  30 tablet  6  . torsemide (DEMADEX) 20 MG tablet Take 1 tablet (20 mg total) by mouth daily. May take extra dose (20mg ) no more than 3 times per week as needed for wt gain  45 tablet  0   No current facility-administered medications for this visit.   Allergies  Allergen Reactions  . Sulfamethoxazole     REACTION: unspecified   Review of Systems: Positive for confusion, depression, hearing problems, headaches, muscle pain, sleeping problems and swelling of the feet and legs. All other systems reviewed and negative except where noted in HPI.   Dg Abd Acute W/chest  03/22/2013   *RADIOLOGY REPORT*  Clinical Data: Diarrhea, weakness, shortness of breath, history CABG, unspecified essential hypertension, peripheral vascular disease, CHF, prostate cancer  ACUTE ABDOMEN SERIES (ABDOMEN 2 VIEW & CHEST 1 VIEW)  Comparison: Chest and abdominal radiographs of 09/09/2012  Findings: Normal heart size post CABG. Calcified tortuous aorta. Pulmonary vascularity and mediastinal contours otherwise normal. Minimal chronic atelectasis at right costophrenic angle. Lungs otherwise clear. No pleural effusion or pneumothorax. Calcified pleural plaques at the lower left chest again noted.  Nonobstructive bowel gas pattern. No bowel dilatation or bowel wall thickening or free intraperitoneal air. Brachytherapy seed implants at prostate bed. No urinary tract calcification. Degenerative disc facet disease changes of the thoracolumbar spine with dextroconvex scoliosis.  IMPRESSION: No acute abnormalities.   Original Report Authenticated By: Ulyses Southward, M.D.    Physical Exam: BP 130/60  Pulse 70  Ht 6' (1.829 m)  Wt 184 lb 3.2 oz (83.553 kg)  BMI 24.98 kg/m2 Constitutional: Pleasant,well-developed, white male in no acute  distress. HEENT: Normocephalic and atraumatic. Conjunctivae are normal. No scleral icterus. Neck supple.  Cardiovascular: Normal rate, regular rhythm.  Pulmonary/chest: Effort normal and breath sounds normal. No wheezing, rales or rhonchi. Abdominal: Soft, nondistended, nontender. Bowel sounds active throughout. There are no masses palpable. No hepatomegaly. Extremities: no edema Lymphadenopathy: No cervical adenopathy noted. Neurological: Alert and oriented to person place and time. Skin: Skin is warm and dry. No rashes noted. Psychiatric: Normal mood and affect. Behavior is normal.   ASSESSMENT AND PLAN: 79 year old male presenting with bowel changes. He has chronic constipation but now with two week history of frequent unformed stools. C-diff, stool culture negative. After excluding med changes, recent antibiotics and other possible  ulprits, the etiology of his bowel change is not apparent. Wth  history of constipation would consider overflow though stools are typically more loose with overflow and recent abdominal films didn't show much stool in colon. Given advanced age and comorbities will try to avoid invasive workup. Will treat empirically with a course of Flagyl and call patient for a condition update in a few days. If no improvement then we may need to proceed with colonoscopy.  2. Longstanding GERD, symptoms suboptimally controlled on daily PPI and H2 blocker. GERD literature given.

## 2013-03-25 NOTE — Telephone Encounter (Signed)
Left message for pt to call back  °

## 2013-03-26 LAB — STOOL CULTURE

## 2013-03-26 NOTE — Progress Notes (Signed)
Reviewed and agree.

## 2013-03-27 ENCOUNTER — Ambulatory Visit: Payer: Medicare HMO | Admitting: Cardiovascular Disease

## 2013-04-01 ENCOUNTER — Telehealth: Payer: Self-pay | Admitting: Internal Medicine

## 2013-04-01 NOTE — Telephone Encounter (Signed)
Per Willette Cluster, NP rescheduled procedure to 04/08/13 at 2:30 PM. Reviewed instructions with caregiver and gave her new times. Increase Lomotil to BID until 04/06/13. Have patient drink maximum of his fluid he can have. If gets weaker, SOB go to ED. Patient's caregiver aware.

## 2013-04-01 NOTE — Telephone Encounter (Signed)
Spoke with patient's caregiver and she states the patient is still having diarrhea 3-6 times/day. It is puree like. Patient is getting weaker. He does not want to eat. He is drinking fluids but this is limited due to CHF. Caregiver states his weight has bee 182 all week which is unusual for him because his weight usually changes due to CHF and renal issues. She is concerned patient may need IVF again for dehydration. Please, advise.

## 2013-04-02 ENCOUNTER — Other Ambulatory Visit: Payer: Self-pay | Admitting: Internal Medicine

## 2013-04-02 NOTE — Telephone Encounter (Signed)
Patient's daughter called because she is concerned about her father having a colonoscopy with his age and medical issues. She is wondering if he has bacterial overgrowth or microscopic colitis. She is asking if he must have a colonoscopy before treating for either of these. Discussed with her getting a release of information signed by her father so we can discuss his care with her. She will work on getting this.

## 2013-04-02 NOTE — Telephone Encounter (Signed)
I have spoken to P.Myer Haff NP about  Daughter's call. I wish she comes  With the pt for the next appointment so we can discuss his care. We will try  Empirical Prednisone 15 mg/day as per daughter's suggestions for possible microscopic colitis,x 2 weeks, and Cipro 250 mg po bid for possible infectious colitis x 2 weeks. to avoid colonoscopy.. He has already been treated for bacterial overgrowth.with Flagyl.He needs to be seen in the office  In 2 weeks by me and , please, have his daughter to come with him.

## 2013-04-03 ENCOUNTER — Ambulatory Visit (INDEPENDENT_AMBULATORY_CARE_PROVIDER_SITE_OTHER): Payer: Medicare HMO | Admitting: Cardiovascular Disease

## 2013-04-03 ENCOUNTER — Encounter: Payer: Self-pay | Admitting: Cardiovascular Disease

## 2013-04-03 ENCOUNTER — Encounter: Payer: Self-pay | Admitting: Internal Medicine

## 2013-04-03 VITALS — BP 128/70 | HR 61 | Ht 72.0 in | Wt 183.4 lb

## 2013-04-03 DIAGNOSIS — I2510 Atherosclerotic heart disease of native coronary artery without angina pectoris: Secondary | ICD-10-CM

## 2013-04-03 DIAGNOSIS — F411 Generalized anxiety disorder: Secondary | ICD-10-CM

## 2013-04-03 DIAGNOSIS — N183 Chronic kidney disease, stage 3 unspecified: Secondary | ICD-10-CM

## 2013-04-03 DIAGNOSIS — R197 Diarrhea, unspecified: Secondary | ICD-10-CM

## 2013-04-03 MED ORDER — TORSEMIDE 20 MG PO TABS: 20.0000 mg | ORAL_TABLET | Freq: Every day | ORAL | Status: DC

## 2013-04-03 MED ORDER — CIPROFLOXACIN HCL 250 MG PO TABS: ORAL_TABLET | ORAL | Status: DC

## 2013-04-03 MED ORDER — PREDNISONE 5 MG PO TABS: ORAL_TABLET | ORAL | Status: DC

## 2013-04-03 NOTE — Patient Instructions (Addendum)
Your physician has recommended you make the following change in your medication:  Stop torsemide if  Diarrhea develop.  Your physician recommends that you schedule a follow-up appointment in: 4  MONTHS

## 2013-04-03 NOTE — Telephone Encounter (Signed)
Spoke with caregiver and scheduled on 04/18/13 at 2:00 PM with Dr. Juanda Chance.

## 2013-04-03 NOTE — Telephone Encounter (Signed)
Spoke with patient's daughter and gave her Dr. Regino Schultze recommendations. Rx's sent. Patient's daughter or son will come with patient on OV in 2 weeks. Caregiver to call to schedule OV.

## 2013-04-08 ENCOUNTER — Encounter: Payer: Medicare HMO | Admitting: Internal Medicine

## 2013-04-09 ENCOUNTER — Encounter: Payer: Self-pay | Admitting: *Deleted

## 2013-04-09 ENCOUNTER — Other Ambulatory Visit: Payer: Self-pay | Admitting: *Deleted

## 2013-04-09 MED ORDER — HYDRALAZINE HCL 25 MG PO TABS: 25.0000 mg | ORAL_TABLET | Freq: Three times a day (TID) | ORAL | Status: DC

## 2013-04-16 ENCOUNTER — Encounter: Payer: Medicare HMO | Admitting: Internal Medicine

## 2013-04-18 ENCOUNTER — Ambulatory Visit: Payer: Medicare HMO | Admitting: Internal Medicine

## 2013-04-18 ENCOUNTER — Encounter: Payer: Self-pay | Admitting: Gastroenterology

## 2013-04-18 ENCOUNTER — Ambulatory Visit (INDEPENDENT_AMBULATORY_CARE_PROVIDER_SITE_OTHER): Payer: Medicare HMO | Admitting: Gastroenterology

## 2013-04-18 VITALS — BP 130/70 | HR 72 | Ht 72.0 in | Wt 186.4 lb

## 2013-04-18 DIAGNOSIS — R197 Diarrhea, unspecified: Secondary | ICD-10-CM

## 2013-04-18 NOTE — Progress Notes (Signed)
History of Present Illness:  Billy Morgan has returned for followup of diarrhea.  Symptoms have entirely subsided.  He apparently received Cipro and prednisone after diarrhea was unimproved with Flagyl alone.  He is also taking apple cider vinegar daily.  Bowels are now back to normal.  They are solid and without urgency.    Review of Systems: Pertinent positive and negative review of systems were noted in the above HPI section. All other review of systems were otherwise negative.    Current Medications, Allergies, Past Medical History, Past Surgical History, Family History and Social History were reviewed in [employer] electronic medical record  Vital signs were reviewed in today's medical record. Physical Exam: General: Well developed , well nourished, no acute distress

## 2013-04-18 NOTE — Assessment & Plan Note (Signed)
Recent diarrheal episode probably of infectious origin.  Empiric therapy with Cipro, Flagyl and prednisone seems to have resolved the problem.  Recommendations #1 no further workup or therapy #2 should diarrhea recur I would consider sigmoidoscopy

## 2013-04-18 NOTE — Patient Instructions (Addendum)
Follow up as needed

## 2013-04-28 ENCOUNTER — Telehealth: Payer: Self-pay | Admitting: Gastroenterology

## 2013-04-28 ENCOUNTER — Encounter: Payer: Self-pay | Admitting: Cardiovascular Disease

## 2013-04-28 NOTE — Telephone Encounter (Signed)
Pts nurse called and states the pt has started to have diarrhea again. States it started this weekend and he has had 3-4 diarrhea stools/day. Pt took cipro and prednisone and it helped in the past. States they were trying to keep from doing any procedures due to the pts age. Want to know if pt could be placed on cipro and prednisone to see if this helps again. Please advise.

## 2013-04-28 NOTE — Telephone Encounter (Signed)
He needs a sigmoidoscopy.  This is a very limited exam which I think he can tolerate.

## 2013-04-28 NOTE — Telephone Encounter (Signed)
Pts nurse is very concerned about a procedure. States that the pt has been hospitalized 3 times this year for fluid around his heart. States he has cardiac issues and they are concerned about him being sedated. Also states his BP is quite high. Please advise.

## 2013-04-28 NOTE — Telephone Encounter (Signed)
He can have a sigmoidoscopy without sedation.  This can be a very limited exam.  I don't think this will have any impact on his cardiac status.  We can do it without sedation

## 2013-04-28 NOTE — Progress Notes (Signed)
Patient ID: Billy Morgan, male   DOB: [birth year], 79 y.o.   MRN: [id number]     HPI: Billy Morgan, is a 79 y.o. male who presents for cardiology followup evaluation.  [name] has known coronary artery disease and underwent CABG revascularization surgery in 1997 the has chronic renal insufficiency, history of prostate cancer, diastolic dysfunction, venous insufficiency, and Lotrimin edema. He recently developed diarrhea and had seen Drs. Billy Morgan and Billy Morgan. Apparently, he was having at least 4 stools a day for over a month. He apparently received Cipro and prednisone since the diarrhea was not improved with Flagyl alone. He had some issues with depression intermittently and anxiety. He is here with his caretaker today. His diarrhea has improved. He denies recent increasing swelling. He denies chest pain. He did have a nuclear perfusion study in January 2014 which was essentially normal.    Past Medical History  Diagnosis Date  . Unspecified hearing loss   . Unspecified essential hypertension   . Coronary atherosclerosis of unspecified type of vessel, native or graft   . Peripheral vascular disease, unspecified   . Unspecified venous (peripheral) insufficiency   . Other and unspecified hyperlipidemia   . Esophageal reflux   . Irritable bowel syndrome   . Unspecified disorder resulting from impaired renal function   . Osteoarthrosis, unspecified whether generalized or localized, unspecified site   . Memory loss   . Anxiety state, unspecified   . CHF (congestive heart failure)   . Malignant neoplasm of prostate   . Dementia   . Anasarca 09/09/2012  . Shortness of breath   . Hx of echocardiogram 2013    EF 55%-65% with grade 1 diatolic dyfunction.  Marland Kitchen History of stress test 07/2012    normal perfusion without scar or ischemia.  . Renal insufficiency     Past Surgical History  Procedure Laterality Date  . Inguinal hernia repair    . Appendectomy    . Coronary artery bypass graft   1997  . Coronary angioplasty with stent placement      Allergies  Allergen Reactions  . Sulfamethoxazole     REACTION: unspecified    Current Outpatient Prescriptions  Medication Sig Dispense Refill  . acetaminophen (TYLENOL) 650 MG CR tablet Take 650 mg by mouth every 8 (eight) hours as needed for pain (for pain).      Marland Kitchen aspirin 81 MG tablet Take 81 mg by mouth daily.        Marland Kitchen donepezil (ARICEPT) 5 MG tablet Take 1 tablet (5 mg total) by mouth at bedtime.  30 tablet  5  . isosorbide mononitrate (IMDUR) 60 MG 24 hr tablet 30 mg every morning and 60 mg every evening  45 tablet  6  . ketoconazole (NIZORAL) 2 % cream Apply 1 application topically daily. To left side of face, behind ears, and to scalp      . metoprolol succinate (TOPROL-XL) 50 MG 24 hr tablet Take 50 mg by mouth daily.       . Multiple Vitamin (MULTIVITAMIN WITH MINERALS) TABS Take 2 tablets by mouth daily.      . nitroGLYCERIN (NITROSTAT) 0.4 MG SL tablet Place 0.4 mg under the tongue every 5 (five) minutes as needed. For chest pain      . potassium chloride SA (K-DUR,KLOR-CON) 20 MEQ tablet Take 20 mEq by mouth daily.       . rosuvastatin (CRESTOR) 20 MG tablet Take 1 tablet (20 mg total) by mouth daily.  30 tablet  3  . sertraline (ZOLOFT) 25 MG tablet Take 1 tablet (25 mg total) by mouth daily.  30 tablet  6  . torsemide (DEMADEX) 20 MG tablet Take 1 tablet (20 mg total) by mouth daily. May take extra dose (20mg ) no more than 3 times per week as needed for wt gain  45 tablet  6  . hydrALAZINE (APRESOLINE) 25 MG tablet Take 1 tablet (25 mg total) by mouth 3 (three) times daily.  90 tablet  5  . NON FORMULARY 1 tbls vinegar with 8 oz water daily for GERD and diarreah       No current facility-administered medications for this visit.    History   Social History  . Marital Status: Widowed    Spouse Name: N/A    Number of Children: 4  . Years of Education: N/A   Occupational History  . retired    Social History  Main Topics  . Smoking status: Never Smoker   . Smokeless tobacco: Never Used  . Alcohol Use: No  . Drug Use: No  . Sexual Activity: Not on file   Other Topics Concern  . Not on file   Social History Narrative  . No narrative on file    Family History  Problem Relation Age of Onset  . Hypertension Mother   . Heart disease Father   . Colon cancer Neg Hx     ROS is negative for fevers, chills or night sweats. He denies palpitations. He denies presyncope. There is some mild shortness of breath. He denies chest pressure per time she does still have some crying spells. There is no further diarrhea. There is some intermittent leg swelling. Other system review is negative.  PE BP 128/70  Pulse 61  Ht 6' (1.829 m)  Wt 183 lb 6.4 oz (83.19 kg)  BMI 24.87 kg/m2  General: Alert, oriented, no distress.  Skin: normal turgor, no rashes HEENT: Normocephalic, atraumatic. Pupils round and reactive; sclera anicteric;no lid lag.  Nose without nasal septal hypertrophy Mouth/Parynx benign; Mallinpatti scale 3 Neck: No JVD, no carotid briuts Lungs: clear to ausculatation and percussion; no wheezing or rales Heart: RRR, s1 s2 normal 1/6 systolic murmur Abdomen: soft, nontender; no hepatosplenomehaly, BS+; abdominal aorta nontender and not dilated by palpation. Pulses 2+ Extremities: no clubbing cyanosis or edema, Homan's sign negative  Neurologic: grossly nonfocal  ECG: Sinus rhythm at 61 beats per minute per QTc interval 420 ms. Borderline first-degree block with a PR interval at 208 ms.  LABS:  BMET    Component Value Date/Time   NA 140 03/22/2013 1305   K 3.6 03/22/2013 1305   CL 103 03/22/2013 1305   CO2 26 03/22/2013 1305   GLUCOSE 85 03/22/2013 1305   BUN 21 03/22/2013 1305   CREATININE 1.67* 03/22/2013 1305   CREATININE 1.85* 01/10/2013 1410   CALCIUM 9.1 03/22/2013 1305   GFRNONAA 34* 03/22/2013 1305   GFRAA 39* 03/22/2013 1305     Hepatic Function Panel     Component Value  Date/Time   PROT 6.8 03/22/2013 1305   ALBUMIN 3.5 03/22/2013 1305   AST 28 03/22/2013 1305   ALT 15 03/22/2013 1305   ALKPHOS 45 03/22/2013 1305   BILITOT 0.7 03/22/2013 1305   BILIDIR 0.2 11/07/2012 1728   IBILI 0.6 09/12/2012 1020     CBC    Component Value Date/Time   WBC 9.3 03/22/2013 1305   RBC 4.11* 03/22/2013 1305   HGB 13.5 03/22/2013 1305  HCT 38.6* 03/22/2013 1305   PLT 179 03/22/2013 1305   MCV 93.9 03/22/2013 1305   MCH 32.8 03/22/2013 1305   MCHC 35.0 03/22/2013 1305   RDW 13.5 03/22/2013 1305   LYMPHSABS 2.0 03/22/2013 1305   MONOABS 1.0 03/22/2013 1305   EOSABS 0.1 03/22/2013 1305   BASOSABS 0.0 03/22/2013 1305     BNP    Component Value Date/Time   PROBNP 554.5* 03/22/2013 1313    Lipid Panel     Component Value Date/Time   CHOL 124 02/06/2012 1721   TRIG 165.0* 02/06/2012 1721   HDL 35.90* 02/06/2012 1721   CHOLHDL 3 02/06/2012 1721   VLDL 33.0 02/06/2012 1721   LDLCALC 55 02/06/2012 1721     RADIOLOGY: No results found.    ASSESSMENT AND PLAN:  Mr. Billy Morgan is now 79 years old. He is 19 years status post CABG revascularization surgery. He has renal insufficiency, and recently had a prolonged bout of diarrhea per at that time, he was still taking his diuretic regimen. I recommended that he is to hold his torsemide and only take this on an as-needed basis. Certainly if diarrhea occurs he should not take this at all. His rhythm is stable Blood pressure today is controlled without orthostatic change. I will see him in 4 months for followup evaluation.    Lennette Bihari, MD, [facility]  04/28/2013 3:48 PM

## 2013-04-29 NOTE — Telephone Encounter (Signed)
Imodium 2 tabs every morning.  He can take 2 tabs every 6 hours as needed provided that he does not get distended.

## 2013-04-29 NOTE — Telephone Encounter (Signed)
Pt scheduled for previsit 05/05/13@1 :30pm. Pt scheduled for sigmoidoscopy 05/15/13@3 :30pm with no sedation. Pts nurse wants to know what they can do in the meantime until flex-sig can be done. Pt is still having diarrhea. Please advise.

## 2013-04-29 NOTE — Telephone Encounter (Signed)
Spoke with Nicki Guadalajara and she is aware. She is also aware of appts.

## 2013-05-05 ENCOUNTER — Other Ambulatory Visit: Payer: Self-pay | Admitting: Adult Health

## 2013-05-07 ENCOUNTER — Ambulatory Visit (AMBULATORY_SURGERY_CENTER): Payer: Self-pay

## 2013-05-07 VITALS — Ht 72.0 in | Wt 183.0 lb

## 2013-05-07 DIAGNOSIS — R197 Diarrhea, unspecified: Secondary | ICD-10-CM

## 2013-05-09 ENCOUNTER — Encounter: Payer: Self-pay | Admitting: Gastroenterology

## 2013-05-15 ENCOUNTER — Encounter: Payer: Self-pay | Admitting: Gastroenterology

## 2013-05-15 ENCOUNTER — Ambulatory Visit (AMBULATORY_SURGERY_CENTER): Payer: Medicare HMO | Admitting: Gastroenterology

## 2013-05-15 VITALS — BP 140/72 | HR 63 | Temp 98.2°F | Resp 26 | Ht 72.0 in | Wt 181.4 lb

## 2013-05-15 DIAGNOSIS — K5730 Diverticulosis of large intestine without perforation or abscess without bleeding: Secondary | ICD-10-CM

## 2013-05-15 DIAGNOSIS — R197 Diarrhea, unspecified: Secondary | ICD-10-CM

## 2013-05-15 DIAGNOSIS — K5289 Other specified noninfective gastroenteritis and colitis: Secondary | ICD-10-CM

## 2013-05-15 MED ORDER — SODIUM CHLORIDE 0.9 % IV SOLN: 500.0000 mL | INTRAVENOUS | Status: DC

## 2013-05-15 NOTE — Progress Notes (Signed)
Patient did not experience any of the following events: a burn prior to discharge; a fall within the facility; wrong site/side/patient/procedure/implant event; or a hospital transfer or hospital admission upon discharge from the facility. (G8907) Patient did not have preoperative order for IV antibiotic SSI prophylaxis. (G8918)  

## 2013-05-15 NOTE — Op Note (Signed)
[facility] [address] [place], [address]   FLEXIBLE SIGMOIDOSCOPY PROCEDURE REPORT  PATIENT: Antonino, Nienhuis  MR#: [id number] BIRTHDATE: March 18, 1918 , 89  yrs. old GENDER: Male ENDOSCOPIST: Louis Meckel, MD REFERRED BY: PROCEDURE DATE:  05/15/2013 PROCEDURE:   Sigmoidoscopy with biopsy ASA CLASS:   Class III INDICATIONS:unexplained diarrhea. MEDICATIONS:  DESCRIPTION OF PROCEDURE:   After the risks benefits and alternatives of the procedure were thoroughly explained, informed consent was obtained.  revealed no abnormalities of the rectum. The [id number]  endoscope was introduced through the anus  and advanced to the proximal sigmoid colon at 40 cm from the anus , limited by No adverse events experienced.   The quality of the prep was    .  The instrument was then slowly withdrawn as the mucosa was fully examined.         COLON FINDINGS: There were no gross mucosal abnormalities throughout the rectum and sigmoid.  At most, there may be very slight erythema.  biopsies were taken.  There was moderate diverticulosis with areas of luminal narrowing throughout the sigmoid colon. The scope was then withdrawn from the patient and the procedure terminated.  COMPLICATIONS: There were no complications.  ENDOSCOPIC IMPRESSION: probably normal sigmoidoscopy  RECOMMENDATIONS: Trial of align one tab daily Await biopsy findings Office visit 1-2 weeks  REPEAT EXAM:   _______________________________ eSignedLouis Meckel, MD 05/15/2013 3:49 PM   WU:[id number] Elayne Snare, MD Zelphia Cairo MD

## 2013-05-15 NOTE — Patient Instructions (Signed)

## 2013-05-16 ENCOUNTER — Telehealth: Payer: Self-pay | Admitting: *Deleted

## 2013-05-16 NOTE — Telephone Encounter (Signed)
  Follow up Call-  Call back number 05/15/2013  Post procedure Call Back phone  # [phone]  Permission to leave phone message Yes     Patient questions:  Do you have a fever, pain , or abdominal swelling? no Pain Score  0 *  Have you tolerated food without any problems? yes  Have you been able to return to your normal activities? yes  Do you have any questions about your discharge instructions: Diet   no Medications  no Follow up visit  no  Do you have questions or concerns about your Care? no  Actions: * If pain score is 4 or above: No action needed, pain <4.

## 2013-05-20 ENCOUNTER — Telehealth: Payer: Self-pay | Admitting: Gastroenterology

## 2013-05-20 NOTE — Telephone Encounter (Signed)
Pt needed follow-up appt from flex-sig. Pt is already scheduled for appt in November. Discussed that this is Dr. Marzetta Board soonest appt unless he has any cancellations. [name] verbalized understanding.

## 2013-05-22 ENCOUNTER — Other Ambulatory Visit: Payer: Self-pay | Admitting: *Deleted

## 2013-05-22 ENCOUNTER — Encounter: Payer: Self-pay | Admitting: Gastroenterology

## 2013-05-22 DIAGNOSIS — K52839 Microscopic colitis, unspecified: Secondary | ICD-10-CM

## 2013-05-22 MED ORDER — MESALAMINE 1.2 G PO TBEC: DELAYED_RELEASE_TABLET | ORAL | Status: DC

## 2013-06-03 ENCOUNTER — Other Ambulatory Visit: Payer: Self-pay | Admitting: *Deleted

## 2013-06-03 MED ORDER — ROSUVASTATIN CALCIUM 20 MG PO TABS: 20.0000 mg | ORAL_TABLET | Freq: Every day | ORAL | Status: DC

## 2013-06-06 ENCOUNTER — Other Ambulatory Visit (INDEPENDENT_AMBULATORY_CARE_PROVIDER_SITE_OTHER): Payer: Medicare HMO

## 2013-06-06 ENCOUNTER — Telehealth: Payer: Self-pay | Admitting: Gastroenterology

## 2013-06-06 DIAGNOSIS — K5289 Other specified noninfective gastroenteritis and colitis: Secondary | ICD-10-CM

## 2013-06-06 DIAGNOSIS — K52839 Microscopic colitis, unspecified: Secondary | ICD-10-CM

## 2013-06-06 LAB — BASIC METABOLIC PANEL
BUN: 16 mg/dL (ref 6–23)
CO2: 28 mEq/L (ref 19–32)
Calcium: 9.2 mg/dL (ref 8.4–10.5)
Chloride: 104 mEq/L (ref 96–112)
Creatinine, Ser: 1.4 mg/dL (ref 0.4–1.5)
Potassium: 3.9 mEq/L (ref 3.5–5.1)

## 2013-06-06 NOTE — Telephone Encounter (Signed)
Spoke with Billy Morgan and she states pt is still having diarrhea and headaches. She was confused because pts son received a call telling them to come in before scheduled OV. Discussed with her that pt needs labs drawn. She will bring him in for labs today.

## 2013-06-09 ENCOUNTER — Telehealth: Payer: Self-pay | Admitting: Gastroenterology

## 2013-06-09 NOTE — Telephone Encounter (Signed)
Caregiver called and states that the pt is not eating due to headaches. Pt had labs done Friday and she is calling to check on the lab results. Wants to know if the lab results indicate that the pt needs IV fluids. Please advise.

## 2013-06-09 NOTE — Telephone Encounter (Signed)
Lab results are stable.  He needs to be seen by his primary care physician.  His lap work does not indicate dehydration but that is also a clinical diagnosis.

## 2013-06-09 NOTE — Telephone Encounter (Signed)
Spoke with caregiver and she states that the pt did not start having headaches until he was started on Lialda. States the pts diarrhea is not any better and he will not eat now due to the headaches. Wants to know if he can stop the lialda. Please advise.

## 2013-06-09 NOTE — Telephone Encounter (Signed)
Discontinue lialda. Begin Pepto-Bismol 2 tabs 4 times a day

## 2013-06-09 NOTE — Telephone Encounter (Signed)
Left message for caregiver to call back.

## 2013-06-10 NOTE — Telephone Encounter (Signed)
Left message for Tricia to call back

## 2013-06-11 NOTE — Telephone Encounter (Signed)
Attempted to leave a voicemail but was not able to, voicemail states has not been set up.

## 2013-06-12 NOTE — Telephone Encounter (Signed)
Spoke with Nicki [place] and she is aware.

## 2013-06-25 ENCOUNTER — Ambulatory Visit: Payer: Medicare HMO | Admitting: Gastroenterology

## 2013-07-04 IMAGING — CR DG CHEST 2V
2 series · 2 of 2 positions shown · non-contrast
Comparison: 07/04/2012

CLINICAL DATA: Rhonchi.  Evaluate for possible pneumonia.

CHEST - 2 VIEW

[w chest pa]
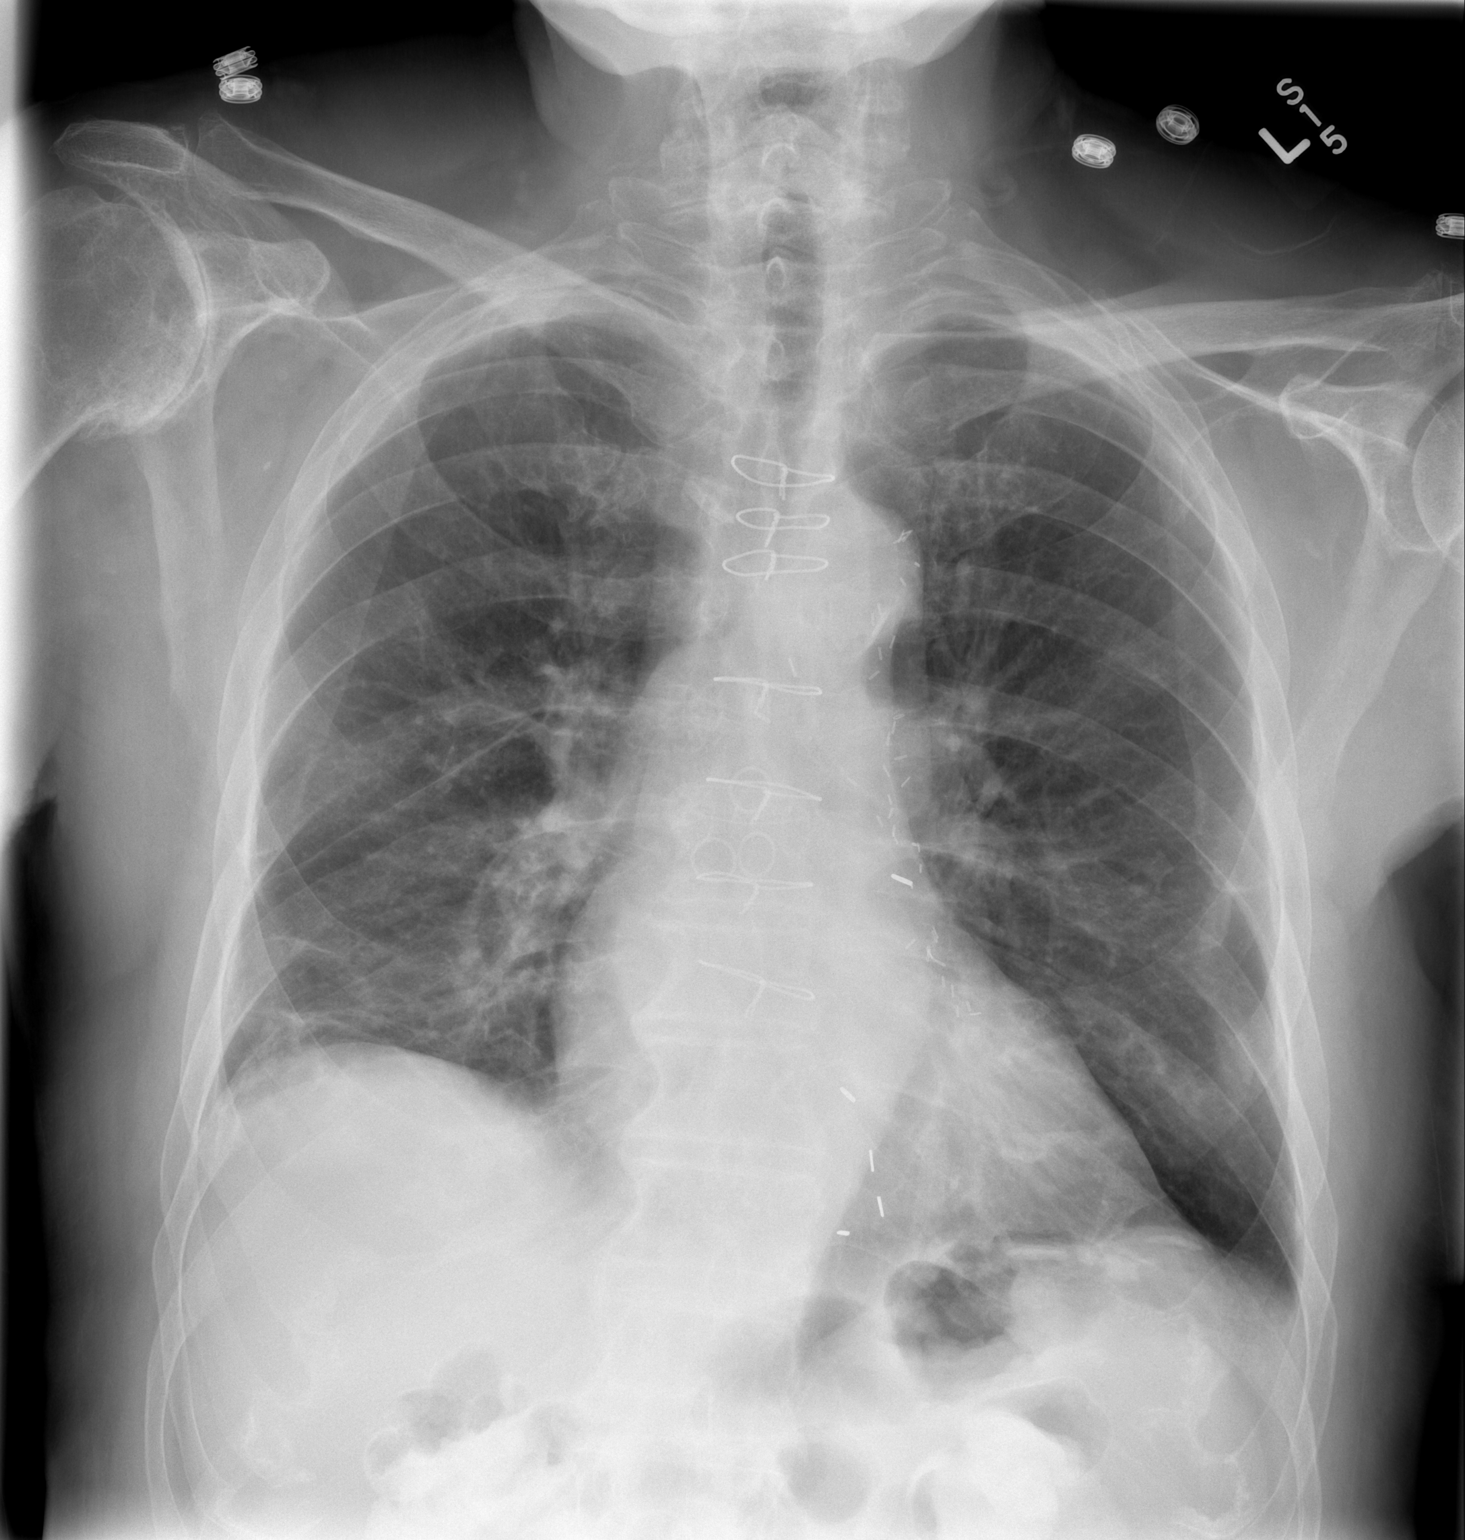

[w chest lat]
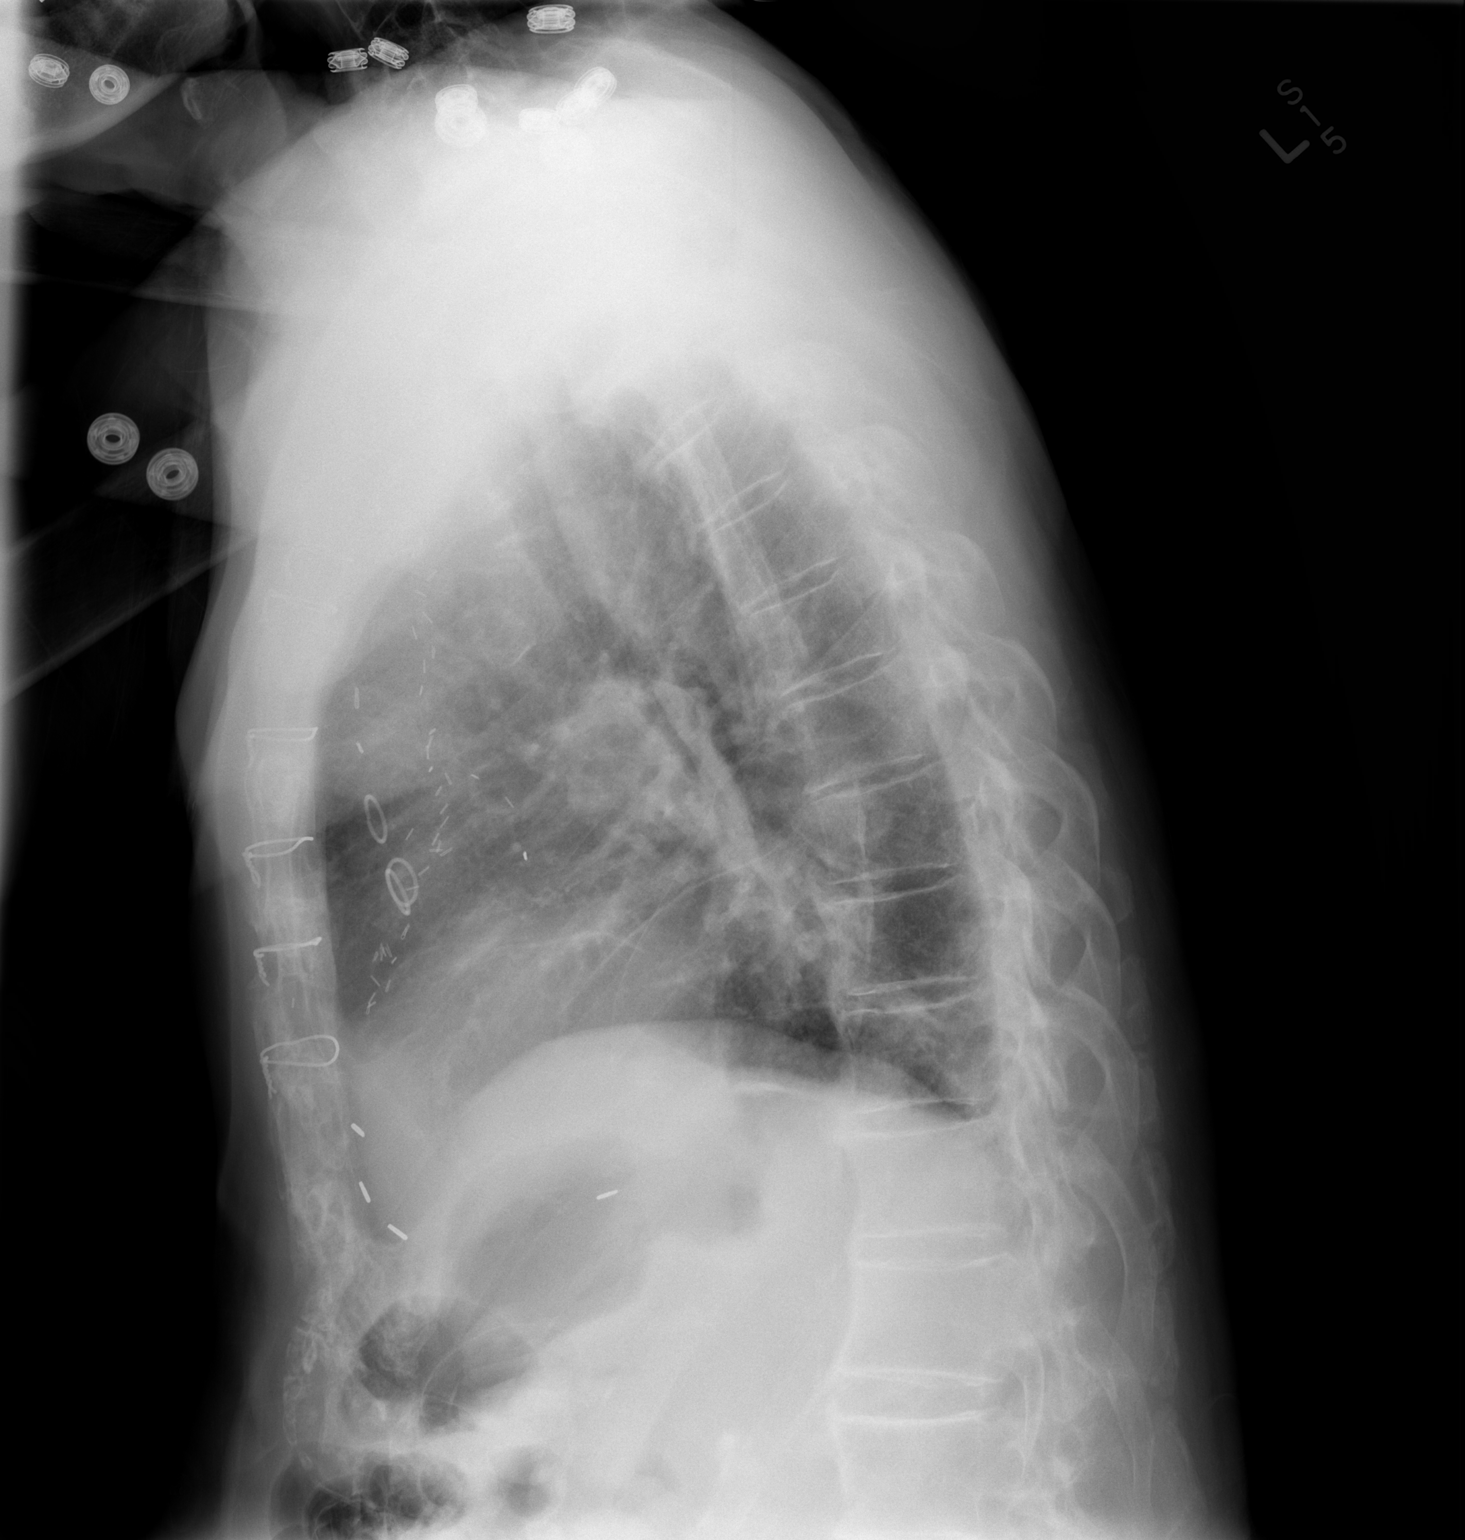

[2 of 2 positions shown; findings below may reference images not displayed]

FINDINGS: Changes from CABG surgery are stable.  The cardiac
silhouette is normal in size and configuration.  No mediastinal or
hilar masses or adenopathy.

Calcified pleural plaque along the left hemidiaphragm.  The right
hemidiaphragm is mildly elevated.

There are coarse reticular opacities above the right hemidiaphragm
most likely scarring and / or subsegmental atelectasis.  There is
no convincing infiltrate and no pulmonary edema.  No pleural
effusion or pneumothorax is seen.

The bony thorax is demineralized.  There are arthropathic changes
of the right shoulder.
IMPRESSION: No acute cardiopulmonary disease.

## 2013-07-18 ENCOUNTER — Other Ambulatory Visit: Payer: Self-pay | Admitting: Pulmonary Disease

## 2013-08-04 ENCOUNTER — Other Ambulatory Visit: Payer: Self-pay | Admitting: *Deleted

## 2013-08-04 MED ORDER — POTASSIUM CHLORIDE CRYS ER 20 MEQ PO TBCR: 20.0000 meq | EXTENDED_RELEASE_TABLET | Freq: Every day | ORAL | Status: DC

## 2013-08-18 ENCOUNTER — Ambulatory Visit: Payer: Medicare HMO | Admitting: Pulmonary Disease

## 2013-09-02 ENCOUNTER — Other Ambulatory Visit: Payer: Self-pay | Admitting: Pulmonary Disease

## 2013-09-29 ENCOUNTER — Other Ambulatory Visit: Payer: Self-pay

## 2013-09-29 MED ORDER — ISOSORBIDE MONONITRATE ER 60 MG PO TB24: ORAL_TABLET | ORAL | Status: DC

## 2013-09-29 NOTE — Telephone Encounter (Signed)
Rx was sent to pharmacy electronically. 

## 2013-10-01 ENCOUNTER — Telehealth: Payer: Self-pay | Admitting: *Deleted

## 2013-10-01 DIAGNOSIS — E782 Mixed hyperlipidemia: Secondary | ICD-10-CM

## 2013-10-01 DIAGNOSIS — Z79899 Other long term (current) drug therapy: Secondary | ICD-10-CM

## 2013-10-01 NOTE — Telephone Encounter (Signed)
Billy Morgan called and stated that Mr. Study needed to come in to see someone. He was last seen in September and she wanted to know if he needed to go get blood work done before his appointment with [place] on Friday 3/6.  TK

## 2013-10-01 NOTE — Telephone Encounter (Signed)
SPOKE TO TRISHA -  RN INFORMED TRISHA -WILL MAIL LAB SLIP SHE STATES THAT HOSPICE PALLIATIVE CARE WILL BE STARTED TODAY  RN INFORMED TRISHA TO LET HOSPICE KNOW THEY MAY BE ABLE TO DRAW LABS AT PATIENT 'S HOME. SHE VERBALIZED UNDERSTANDING.

## 2013-10-03 ENCOUNTER — Ambulatory Visit (INDEPENDENT_AMBULATORY_CARE_PROVIDER_SITE_OTHER): Payer: Medicare HMO | Admitting: Cardiology

## 2013-10-03 ENCOUNTER — Other Ambulatory Visit: Payer: Self-pay | Admitting: Cardiology

## 2013-10-03 VITALS — BP 126/80 | HR 65 | Ht 72.0 in | Wt 189.0 lb

## 2013-10-03 DIAGNOSIS — R0609 Other forms of dyspnea: Secondary | ICD-10-CM

## 2013-10-03 DIAGNOSIS — R0989 Other specified symptoms and signs involving the circulatory and respiratory systems: Secondary | ICD-10-CM

## 2013-10-03 DIAGNOSIS — N289 Disorder of kidney and ureter, unspecified: Secondary | ICD-10-CM

## 2013-10-03 DIAGNOSIS — I509 Heart failure, unspecified: Secondary | ICD-10-CM

## 2013-10-03 DIAGNOSIS — I5033 Acute on chronic diastolic (congestive) heart failure: Secondary | ICD-10-CM

## 2013-10-03 LAB — BASIC METABOLIC PANEL
BUN: 25 mg/dL — ABNORMAL HIGH (ref 6–23)
CO2: 32 meq/L (ref 19–32)
Calcium: 8.8 mg/dL (ref 8.4–10.5)
Chloride: 96 mEq/L (ref 96–112)
Creat: 1.6 mg/dL — ABNORMAL HIGH (ref 0.50–1.35)
Glucose, Bld: 67 mg/dL — ABNORMAL LOW (ref 70–99)
Potassium: 3.7 mEq/L (ref 3.5–5.3)
Sodium: 140 mEq/L (ref 135–145)

## 2013-10-03 LAB — BRAIN NATRIURETIC PEPTIDE: BRAIN NATRIURETIC PEPTIDE: 90.8 pg/mL (ref 0.0–100.0)

## 2013-10-03 MED ORDER — SODIUM CHLORIDE 0.9 % IV SOLN
Freq: Once | INTRAVENOUS | Status: AC
  Administered 2013-10-03: 15:00:00 via INTRAVENOUS

## 2013-10-03 MED ORDER — METOLAZONE 2.5 MG PO TABS: 2.5000 mg | ORAL_TABLET | ORAL | Status: DC

## 2013-10-03 MED ORDER — FUROSEMIDE 10 MG/ML IJ SOLN
40.0000 mg | Freq: Once | INTRAMUSCULAR | Status: AC
  Administered 2013-10-03: 40 mg via INTRAVENOUS

## 2013-10-03 NOTE — Progress Notes (Signed)
Patient ID: Billy Morgan, male   DOB: 10/09/1917, 80 y.o.   MRN: [id number]    10/05/2013 Billy Morgan   July 23, 1918  [id number]  Primary Physicia NADEL,SCOTT M, MD Primary Cardiologist: Dr. Claiborne Billings   HPI:  Mr. Billy Morgan presents to clinic today for routine 6 month cardiovascular assessment. He is a 81 y/o male, followed by Dr. Claiborne Billings. Dr. Lenna Gilford follows him medically. He  has known coronary artery disease and underwent CABG revascularization surgery in 1997. His last ischemic evaluation was a NST in Jan. 2014 which demonstrated no signs of ischemia. LV function was normal with an estimated EF of 67%. His last 2D echo was Dec. 2013 which demonstrated an EF of 55-65%, no WMA, but grade I diastolic dysfunction. No valvular abnormalities were noted. He also has chronic renal insufficiency (Scr ~1.6), history of prostate cancer, venous insufficiency, and chronic LEE edema.  He presents to clinic today with his Home Health RN. He presents with a complaint of mild DOE for the last 7 days and notes  increased weight gain and abdominal girth, despite medication compliance and adherence to a low sodium diet. He denies LEE. He states that he typically retains increased fluid in his abdomen when in acute CHF. He has attempted to increase his home torsemide from 20 mg to 40 mg over the last 3 days without any improvement in his symptoms.  His dry weight at home is 184 lbs. He has consistently stayed at 189 lb for the last several days. His HH RN is concerned about his renal function, as a result of the increase in recent oral diuretic. He denies CP, dizziness, syncope and near syncope.     Current Outpatient Prescriptions  Medication Sig Dispense Refill  . acetaminophen (TYLENOL) 650 MG CR tablet Take 650 mg by mouth every 8 (eight) hours as needed for pain (for pain).      Marland Kitchen aspirin 81 MG tablet Take 81 mg by mouth daily.        Marland Kitchen donepezil (ARICEPT) 5 MG tablet take 1 tablet by mouth at bedtime  30 tablet  3    . hydrALAZINE (APRESOLINE) 25 MG tablet Take 25 mg by mouth 2 (two) times daily.      . isosorbide mononitrate (IMDUR) 60 MG 24 hr tablet 30 mg every morning and 60 mg every evening  45 tablet  6  . ketoconazole (NIZORAL) 2 % cream Apply 1 application topically daily. To left side of face, behind ears, and to scalp      . metoprolol succinate (TOPROL-XL) 50 MG 24 hr tablet Take 50 mg by mouth daily.       . Multiple Vitamin (MULTIVITAMIN WITH MINERALS) TABS Take 2 tablets by mouth daily.      . nitroGLYCERIN (NITROSTAT) 0.4 MG SL tablet Place 0.4 mg under the tongue every 5 (five) minutes as needed. For chest pain      . potassium chloride SA (K-DUR,KLOR-CON) 20 MEQ tablet Take 1 tablet (20 mEq total) by mouth daily.  30 tablet  6  . rosuvastatin (CRESTOR) 20 MG tablet Take 1 tablet (20 mg total) by mouth daily.  30 tablet  10  . sertraline (ZOLOFT) 25 MG tablet take 1 tablet by mouth once daily  30 tablet  6  . torsemide (DEMADEX) 20 MG tablet Take 1 tablet (20 mg total) by mouth daily. May take extra dose (20mg ) no more than 3 times per week as needed for wt gain  45 tablet  6  . metolazone (ZAROXOLYN) 2.5 MG tablet Take 1 tablet (2.5 mg total) by mouth every other day.  90 tablet  3   No current facility-administered medications for this visit.    Allergies  Allergen Reactions  . Sulfamethoxazole     REACTION: unspecified    History   Social History  . Marital Status: Widowed    Spouse Name: N/A    Number of Children: 4  . Years of Education: N/A   Occupational History  . retired    Social History Main Topics  . Smoking status: Never Smoker   . Smokeless tobacco: Never Used  . Alcohol Use: No  . Drug Use: No  . Sexual Activity: Not on file   Other Topics Concern  . Not on file   Social History Narrative  . No narrative on file     Review of Systems: General: negative for chills, fever, night sweats or weight changes.  Cardiovascular: positive for dyspnea on  exertion, edema negative for chest pain,  orthopnea, palpitations, paroxysmal nocturnal dyspnea Respiratory: negative for cough or wheezing Urologic: negative for hematuria Abdominal: negative for nausea, vomiting, diarrhea, bright red blood per rectum, melena, or hematemesis Neurologic: negative for visual changes, syncope, or dizziness All other systems reviewed and are otherwise negative except as noted above.    Blood pressure 126/80, pulse 65, height 6' (1.829 m), weight 189 lb (85.73 kg).  General appearance: alert, cooperative and no distress Neck: no carotid bruit and no JVD Lungs: bilateral rales at bases that do not clear with coughing Heart: regular rate and rhythm Abdomen: soft, non-tender; bowel sounds normal; no masses,  no organomegaly and mildly edematous Extremities: no LEE Pulses: 2+ and symmetric Skin: warm and dry Neurologic: Grossly normal  EKG NSR with 1st degree AV block   ASSESSMENT AND PLAN:   Acute on chronic diastolic HF (heart failure), EF 55-65% 12/13 with grade 1 diastolic dysfunction Pt with complaints of DOE and weight gain, despite reported medication compliance, with recent increase in his home diuretic and adherence to a low sodium diet. Physical exam reveals a mildly edematous appearing abdomen. However his lungs are clear to ausculation bilaterally and no JVD or LEE is appreciated. Based on his symptoms, weight gain and inability to diurese on his home toresemide, I feel that he needs IV diureses, however I don't feel he is at the stage that would require hospitalization. I have discused this case with Dr. Gwenlyn Found. We have decided to obtain IV access in clinic and administer a one time dose of 40 mg IV Lasix. His BP is stable. We will obtain a BNP and BMP today as well. We will add 2.5 mg of Metolazone to be used as an adjunct to his home torsemide, every other day. He will f/u up in clinic with an extender on Monday 3/9 for reassessment.     PLAN  The  patient is in agreement to the above plan. He was monitored post IV diuretic administration and blood pressure remained stable. Pt was given paper orders for BNP and BMP to be collected today. Rx for Metolazone was e-prescribed to pharmacy. He will f/u with Cecilie Kicks, NP, on Monday for reassessment. Dr. Claiborne Billings, his primary cardiologist, will also be available in clinic on Monday if needed.   NOTE:  > 45 MINUTES OF DIRECT PATIENT CARE WAS SPENT DURING THIS OFFICE VISIT  Arvilla Market 10/05/2013 12:41 PM

## 2013-10-03 NOTE — Patient Instructions (Addendum)
Continue taking torsemide, 20 mg daily. Add Metolazone (Zaroxolyn) 2.5 mg every other day. Get Labs done today BMP and BNP. Return to clinic on Monday 10/06/13 for follow up with either Dr. Claiborne Billings or Cecilie Kicks, NP. Go to ER if symptoms worsen before Monday.

## 2013-10-05 ENCOUNTER — Encounter: Payer: Self-pay | Admitting: Cardiology

## 2013-10-05 NOTE — Assessment & Plan Note (Signed)
Pt with complaints of DOE and weight gain, despite reported medication compliance, with recent increase in his home diuretic and adherence to a low sodium diet. Physical exam reveals a mildly edematous appearing abdomen. However his lungs are clear to ausculation bilaterally and no JVD or LEE is appreciated. Based on his symptoms, weight gain and inability to diurese on his home toresemide, I feel that he needs IV diureses, however I don't feel he is at the stage that would require hospitalization. I have discused this case with Dr. Gwenlyn Found. We have decided to obtain IV access in clinic and administer a one time dose of 40 mg IV Lasix. His BP is stable. We will obtain a BNP and BMP today as well. We will add 2.5 mg of Metolazone to be used as an adjunct to his home torsemide, every other day. He will f/u up in clinic with an extender on Monday 3/9 for reassessment.

## 2013-10-06 ENCOUNTER — Encounter: Payer: Self-pay | Admitting: Cardiology

## 2013-10-06 ENCOUNTER — Ambulatory Visit (INDEPENDENT_AMBULATORY_CARE_PROVIDER_SITE_OTHER): Payer: Medicare HMO | Admitting: Cardiology

## 2013-10-06 VITALS — BP 102/64 | HR 68 | Ht 72.0 in | Wt 185.2 lb

## 2013-10-06 DIAGNOSIS — R609 Edema, unspecified: Secondary | ICD-10-CM

## 2013-10-06 DIAGNOSIS — I2510 Atherosclerotic heart disease of native coronary artery without angina pectoris: Secondary | ICD-10-CM

## 2013-10-06 DIAGNOSIS — I5033 Acute on chronic diastolic (congestive) heart failure: Secondary | ICD-10-CM

## 2013-10-06 DIAGNOSIS — N183 Chronic kidney disease, stage 3 unspecified: Secondary | ICD-10-CM

## 2013-10-06 DIAGNOSIS — N259 Disorder resulting from impaired renal tubular function, unspecified: Secondary | ICD-10-CM

## 2013-10-06 NOTE — Progress Notes (Signed)
10/06/2013   PCP: Noralee Space, MD   Chief Complaint  Patient presents with  . Follow-up    follow up from 10/03/13, lasix given in office. fatigued yesterday and slight SOB    Primary Cardiologist: Dr. Claiborne Billings  HPI:  80 year old male here for follow up after recent visit with increased edema and DOE.  Pt was given IV lasix on last visit and metolazone was added to meds.  Today he feels much better with mild SOB but wt loss of 4 pounds. We discussed importance of doing this slowly to protect kidneys.   He has known coronary artery disease and underwent CABG revascularization surgery in 1997. His last ischemic evaluation was a NST in Jan. 2014 which demonstrated no signs of ischemia. LV function was normal with an estimated EF of 67%. His last 2D echo was Dec. 2013 which demonstrated an EF of 55-65%, no WMA, but grade I diastolic dysfunction. No valvular abnormalities were noted. He also has chronic renal insufficiency (Scr ~1.6), history of prostate cancer, venous insufficiency, and chronic LEE edema.   Allergies  Allergen Reactions  . Sulfamethoxazole     REACTION: unspecified    Current Outpatient Prescriptions  Medication Sig Dispense Refill  . acetaminophen (TYLENOL) 650 MG CR tablet Take 650 mg by mouth every 8 (eight) hours as needed for pain (for pain).      Marland Kitchen aspirin 81 MG tablet Take 81 mg by mouth daily.        Marland Kitchen donepezil (ARICEPT) 5 MG tablet take 1 tablet by mouth at bedtime  30 tablet  3  . hydrALAZINE (APRESOLINE) 25 MG tablet Take 25 mg by mouth 2 (two) times daily.      . isosorbide mononitrate (IMDUR) 60 MG 24 hr tablet 30 mg every morning and 60 mg every evening  45 tablet  6  . ketoconazole (NIZORAL) 2 % cream Apply 1 application topically daily. To left side of face, behind ears, and to scalp      . metolazone (ZAROXOLYN) 2.5 MG tablet Take 1 tablet (2.5 mg total) by mouth every other day.  90 tablet  3  . metoprolol succinate (TOPROL-XL) 50 MG 24  hr tablet Take 50 mg by mouth daily.       . Multiple Vitamin (MULTIVITAMIN WITH MINERALS) TABS Take 2 tablets by mouth daily.      . nitroGLYCERIN (NITROSTAT) 0.4 MG SL tablet Place 0.4 mg under the tongue every 5 (five) minutes as needed. For chest pain      . potassium chloride SA (K-DUR,KLOR-CON) 20 MEQ tablet Take 1 tablet (20 mEq total) by mouth daily.  30 tablet  6  . rosuvastatin (CRESTOR) 20 MG tablet Take 1 tablet (20 mg total) by mouth daily.  30 tablet  10  . sertraline (ZOLOFT) 25 MG tablet take 1 tablet by mouth once daily  30 tablet  6  . torsemide (DEMADEX) 20 MG tablet Take 1 tablet (20 mg total) by mouth daily. May take extra dose (20mg ) no more than 3 times per week as needed for wt gain  45 tablet  6   No current facility-administered medications for this visit.    Past Medical History  Diagnosis Date  . Unspecified hearing loss   . Unspecified essential hypertension   . Coronary atherosclerosis of unspecified type of vessel, native or graft   . Peripheral vascular disease, unspecified   . Unspecified venous (peripheral) insufficiency   .  Other and unspecified hyperlipidemia   . Esophageal reflux   . Irritable bowel syndrome   . Unspecified disorder resulting from impaired renal function   . Osteoarthrosis, unspecified whether generalized or localized, unspecified site   . Memory loss   . Anxiety state, unspecified   . CHF (congestive heart failure)   . Malignant neoplasm of prostate   . Dementia   . Anasarca 09/09/2012  . Shortness of breath   . Hx of echocardiogram 2013    EF 55%-65% with grade 1 diatolic dyfunction.  Marland Kitchen History of stress test 07/2012    normal perfusion without scar or ischemia.  . Renal insufficiency     Past Surgical History  Procedure Laterality Date  . Inguinal hernia repair    . Appendectomy    . Coronary artery bypass graft  1997  . Coronary angioplasty with stent placement      SAY:[id number]:XN colds or fevers,  weight down 4  pounds Skin:no rashes or ulcers HEENT:no blurred vision, no congestion CV:see HPI PUL:see HPI GI:no diarrhea constipation or melena, no indigestion GU:no hematuria, no dysuria MS:no joint pain, no claudication Neuro:no syncope, no lightheadedness Endo:no diabetes, no thyroid disease  PHYSICAL EXAM BP 102/64  Pulse 68  Ht 6' (1.829 m)  Wt 185 lb 3.2 oz (84.006 kg)  BMI 25.11 kg/m2 General:Pleasant affect, NAD Skin:Warm and dry, brisk capillary refill HEENT:normocephalic, sclera clear, mucus membranes moist Neck:supple, no JVD, no bruits  Heart:S1S2 RRR with 1/6 systolic murmur, no gallup, rub or click Lungs:clear without rales, rhonchi, or wheezes ATF:TDDU, non tender, + BS, do not palpate liver spleen or masses Ext:no lower ext edema, 2+ pedal pulses, 2+ radial pulses Neuro:alert and oriented, MAE, follows commands, + facial symmetry   ASSESSMENT AND PLAN Edema Improved and wt is down 4 pounds.  His Cr is up slightly which we will need to monitor.  Continue current meds and be seen next Monday.  Have BMP done on Friday.   Acute on chronic diastolic HF (heart failure), EF 55-65% 12/13 with grade 1 diastolic dysfunction Pro BNP was 70, today no to mild SOB.   CAD, CABG X 5 '97. Low risk Myoview 1/14 No chest pain  RENAL INSUFFICIENCY Monitor cr. Was up slightly but will continue current diuretics for one more week.

## 2013-10-06 NOTE — Assessment & Plan Note (Signed)
Pro BNP was 70, today no to mild SOB.

## 2013-10-06 NOTE — Assessment & Plan Note (Signed)
Monitor cr. Was up slightly but will continue current diuretics for one more week.

## 2013-10-06 NOTE — Assessment & Plan Note (Signed)
No chest pain

## 2013-10-06 NOTE — Patient Instructions (Signed)
Continue medications as they are, follow up in 1 week on Monday for further eval.  Have lab work done on Friday this week.

## 2013-10-06 NOTE — Assessment & Plan Note (Signed)
Improved and wt is down 4 pounds.  His Cr is up slightly which we will need to monitor.  Continue current meds and be seen next Monday.  Have BMP done on Friday.

## 2013-10-10 ENCOUNTER — Other Ambulatory Visit: Payer: Self-pay | Admitting: Cardiology

## 2013-10-11 LAB — BASIC METABOLIC PANEL
BUN: 38 mg/dL — ABNORMAL HIGH (ref 6–23)
CHLORIDE: 91 meq/L — AB (ref 96–112)
CO2: 32 mEq/L (ref 19–32)
Calcium: 9.6 mg/dL (ref 8.4–10.5)
Creat: 1.97 mg/dL — ABNORMAL HIGH (ref 0.50–1.35)
Glucose, Bld: 88 mg/dL (ref 70–99)
Potassium: 3.3 mEq/L — ABNORMAL LOW (ref 3.5–5.3)
Sodium: 140 mEq/L (ref 135–145)

## 2013-10-13 ENCOUNTER — Telehealth: Payer: Self-pay | Admitting: *Deleted

## 2013-10-13 NOTE — Telephone Encounter (Signed)
Home care nurse informed that we will cancel appt. for tomorrow  And she is to reschedule an appt. For next Tuesday with Kerin Ransom and we will get labs on Thursday per Dr. Sallyanne Kuster

## 2013-10-13 NOTE — Telephone Encounter (Signed)
I have called pt.s son with the lab results and instructions for potassium and metolazone ,I also gave these results and orders to the healthcare nurse ; the nurse stated his wt. was down so she had already been holding the diuretics for a week so that he would not become dehydrated. Nurse also requested that she bring the pt next week instead of tomarrow ; also wanted to repeat the labs later this week  For the appt . Next week

## 2013-10-13 NOTE — Progress Notes (Signed)
Per Dr. Sallyanne Kuster ok to move appt. to next Tuesday and repeat labs on Thursday  ; Home care nurse informed

## 2013-10-14 ENCOUNTER — Ambulatory Visit: Payer: Medicare HMO | Admitting: Cardiology

## 2013-10-17 LAB — LIPID PANEL
CHOL/HDL RATIO: 4.2 ratio
Cholesterol: 139 mg/dL (ref 0–200)
HDL: 33 mg/dL — AB (ref >39)
LDL CALC: 66 mg/dL (ref 0–99)
TRIGLYCERIDES: 198 mg/dL — AB (ref <150)
VLDL: 40 mg/dL (ref 0–40)

## 2013-10-17 LAB — COMPREHENSIVE METABOLIC PANEL
ALK PHOS: 56 U/L (ref 39–117)
ALT: 15 U/L (ref 0–53)
AST: 25 U/L (ref 0–37)
Albumin: 3.9 g/dL (ref 3.5–5.2)
BUN: 25 mg/dL — ABNORMAL HIGH (ref 6–23)
CO2: 30 meq/L (ref 19–32)
CREATININE: 1.68 mg/dL — AB (ref 0.50–1.35)
Calcium: 9.2 mg/dL (ref 8.4–10.5)
Chloride: 98 mEq/L (ref 96–112)
Glucose, Bld: 103 mg/dL — ABNORMAL HIGH (ref 70–99)
Potassium: 4.6 mEq/L (ref 3.5–5.3)
Sodium: 138 mEq/L (ref 135–145)
Total Bilirubin: 0.7 mg/dL (ref 0.2–1.2)
Total Protein: 7.3 g/dL (ref 6.0–8.3)

## 2013-10-22 ENCOUNTER — Ambulatory Visit (INDEPENDENT_AMBULATORY_CARE_PROVIDER_SITE_OTHER): Payer: Medicare HMO | Admitting: Physician Assistant

## 2013-10-22 ENCOUNTER — Encounter: Payer: Self-pay | Admitting: Physician Assistant

## 2013-10-22 VITALS — BP 128/66 | HR 79 | Ht 72.0 in | Wt 185.6 lb

## 2013-10-22 DIAGNOSIS — N183 Chronic kidney disease, stage 3 unspecified: Secondary | ICD-10-CM

## 2013-10-22 DIAGNOSIS — I5033 Acute on chronic diastolic (congestive) heart failure: Secondary | ICD-10-CM

## 2013-10-22 DIAGNOSIS — I10 Essential (primary) hypertension: Secondary | ICD-10-CM

## 2013-10-22 DIAGNOSIS — I2510 Atherosclerotic heart disease of native coronary artery without angina pectoris: Secondary | ICD-10-CM

## 2013-10-22 DIAGNOSIS — F0390 Unspecified dementia without behavioral disturbance: Secondary | ICD-10-CM | POA: Insufficient documentation

## 2013-10-22 NOTE — Progress Notes (Addendum)
[facility], [place], [place]  [address] Phone: [phone] Fax:  [phone]  Date:  10/22/2013   Patient ID:  Billy, Morgan Oct 26, 1917, MRN [id number]   PCP:  Noralee Space, MD  Cardiologist: Dr. Claiborne Billings  History of Present Illness: Billy Morgan is a 80 y.o. male with history of Alzheimer's dementia, chronic diastolic CHF, CAD s/p CABG 1997 with low risk nuc 07/2012, dementia, CKD stage III (baseline report 1.6) who presents to clinic today for followup. Last echo 06/2012: mild LVH, EF 55-60%, no RWMA, grade 1 diastolic dysfunction. Normal albumin 3/20. Last seen 3/9 at which time weight was down 4 lbs and diuretics were continued. He is brought in by his home caregiver who appears to be managing his diuretics very well on an outpatient basis. Dry weight 184-185. He is 185 today and gleefully denies any known symptoms. His caregiver reports that he has chronic SOB and intermittent orthopnea, usually worse this time of year. She is noticing he is slowing down. No chest pain, syncope, LEE. He is taking torsemide 20mg  daily with extra 1 tablet PRN up to 3x/week for extra fluid gain. On Monday (3/23) he required an extra Torsemide for weight 189. Weight is now back down to baseline. Compliant with salt restriction. Has not had to use metolazone recently.  Recent Labs: 11/07/2012: TSH 0.72  03/22/2013: Hemoglobin 13.5; Pro B Natriuretic peptide (BNP) 554.5*  10/17/2013: ALT 15; Creatinine 1.68*; HDL Cholesterol 33*; LDL (calc) 66; Potassium 4.6   Wt Readings from Last 3 Encounters:  10/22/13 185 lb 9.6 oz (84.188 kg)  10/06/13 185 lb 3.2 oz (84.006 kg)  10/03/13 189 lb (85.73 kg)     Past Medical History  Diagnosis Date  . Unspecified hearing loss   . HTN (hypertension)   . CAD (coronary artery disease)     a. 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 b. Low risk Myoview 1/14.  Marland Kitchen Peripheral vascular disease, unspecified   . Venous insufficiency   . Other and unspecified hyperlipidemia   .  Esophageal reflux   . Irritable bowel syndrome   . Osteoarthrosis, unspecified whether generalized or localized, unspecified site   . Anxiety state, unspecified   . Malignant neoplasm of prostate   . Dementia   . Anasarca 09/09/2012  . Hx of echocardiogram 2013    EF 55%-65% with grade 1 diatolic dyfunction.  Marland Kitchen History of stress test 07/2012    normal perfusion without scar or ischemia.  . CKD (chronic kidney disease), stage III     a. Hx of more acute RF in 2013 - Cr 2.75. b. Baseline ~1.6.  . Chronic diastolic CHF (congestive heart failure)   . Prostate cancer   . Chronic edema     Current Outpatient Prescriptions  Medication Sig Dispense Refill  . acetaminophen (TYLENOL) 650 MG CR tablet Take 650 mg by mouth every 8 (eight) hours as needed for pain (for pain).      Marland Kitchen aspirin 81 MG tablet Take 81 mg by mouth daily.        Marland Kitchen donepezil (ARICEPT) 5 MG tablet take 1 tablet by mouth at bedtime  30 tablet  3  . hydrALAZINE (APRESOLINE) 25 MG tablet Take 25 mg by mouth 2 (two) times daily.      . isosorbide mononitrate (IMDUR) 60 MG 24 hr tablet 30 mg every morning and 60 mg every evening  45 tablet  6  . ketoconazole (NIZORAL) 2 % cream Apply 1 application  topically daily. To left side of face, behind ears, and to scalp      . Melatonin 5 MG TABS Take 2 tablets by mouth at bedtime.      . metolazone (ZAROXOLYN) 2.5 MG tablet Take 2.5 mg by mouth as needed.      . metoprolol succinate (TOPROL-XL) 50 MG 24 hr tablet Take 50 mg by mouth daily.       . Multiple Vitamin (MULTIVITAMIN WITH MINERALS) TABS Take 2 tablets by mouth daily.      . nitroGLYCERIN (NITROSTAT) 0.4 MG SL tablet Place 0.4 mg under the tongue every 5 (five) minutes as needed. For chest pain      . rosuvastatin (CRESTOR) 20 MG tablet Take 1 tablet (20 mg total) by mouth daily.  30 tablet  10  . sertraline (ZOLOFT) 25 MG tablet take 1 tablet by mouth once daily  30 tablet  6  . torsemide (DEMADEX) 20 MG tablet Take 1 tablet  (20 mg total) by mouth daily. May take extra dose (20mg ) no more than 3 times per week as needed for wt gain  45 tablet  6  . potassium chloride SA (K-DUR,KLOR-CON) 20 MEQ tablet Take 1 tablet (20 mEq total) by mouth daily.  30 tablet  6   No current facility-administered medications for this visit.    Allergies:   Sulfamethoxazole   Social History:  The patient  reports that he has never smoked. He has never used smokeless tobacco. He reports that he does not drink alcohol or use illicit drugs.   Family History:  The patient's family history includes Heart disease in his father; Hypertension in his mother. There is no history of Colon cancer.   ROS:  Please see the history of present illness.  All other systems reviewed and negative.   PHYSICAL EXAM: VS:  BP 128/66  Pulse 79  Ht 6' (1.829 m)  Wt 185 lb 9.6 oz (84.188 kg)  BMI 25.17 kg/m2 Well nourished, well developed, in no acute distress HEENT: normal Neck: no JVD Cardiac:  normal S1, S2; RRR; no murmur Lungs:  clear to auscultation bilaterally, no wheezing, rhonchi or rales Abd: soft, nontender, no hepatomegaly Ext: no edema Skin: warm and dry Neuro:  moves all extremities spontaneously, hard of hearing with hearing aids in  ASSESSMENT AND PLAN:  1. Chronic diastolic CHF: currently appears euvolemic. Weight is at baseline. Suspect some of his chronic dyspnea is from deconditioning and advanced age. Will update 2D echocardiogram to reassess LV function and exclude interim development of valvular disease. Continue current regimen. His caregiver seems to have excellent understanding of how to manage his diuretics. She will be assessing his O2 this week to determine if there are any needs for home O2 and will contact us with this information. 2. CKD stage III: stable by last labs.  3. CAD s/p remote CABG; low risk myoview 07/2012: continue aspirin, BB, statin. No recent CP.  4. HTN: caregiver reports that when fluid status is up, BP  also goes up. She sometimes gives him an extra hydralazine (BID --> TID) on these days which is reasonable. BP stable today so will not permanently make this increase. 5. Dementia: at baseline today.  Dispo: f/u Dr. Claiborne Billings or PA/NP in 1-2 weeks (this time of year is apparently tenuous for him in terms of volume status, so if doing well at that visit can likely extend out further).  Signed, Melina Copa, PA-C  10/22/2013 3:16 PM

## 2013-10-22 NOTE — Patient Instructions (Signed)
Your physician has requested that you have an echocardiogram. Echocardiography is a painless test that uses sound waves to create images of your heart. It provides your doctor with information about the size and shape of your heart and how well your heart's chambers and valves are working. This procedure takes approximately one hour. There are no restrictions for this procedure.   Your physician recommends that you schedule a follow-up appointment in: 1-2 weeks with extender.

## 2013-10-29 ENCOUNTER — Ambulatory Visit (HOSPITAL_COMMUNITY)
Admission: RE | Admit: 2013-10-29 | Discharge: 2013-10-29 | Disposition: A | Discharge status: Home / Self Care | Payer: Medicare HMO | Source: Ambulatory Visit | Attending: Cardiovascular Disease | Admitting: Cardiovascular Disease

## 2013-10-29 DIAGNOSIS — I519 Heart disease, unspecified: Secondary | ICD-10-CM

## 2013-10-29 DIAGNOSIS — I5033 Acute on chronic diastolic (congestive) heart failure: Secondary | ICD-10-CM | POA: Insufficient documentation

## 2013-10-29 NOTE — Progress Notes (Signed)
  Echocardiogram 2D Echocardiogram has been performed.  Basilia Jumbo 10/29/2013, 8:35 AM

## 2013-10-30 ENCOUNTER — Telehealth: Payer: Self-pay | Admitting: Physician Assistant

## 2013-10-30 NOTE — Telephone Encounter (Signed)
Results related to 3/25 office visit: there has been a disruption in automatic transfer to inbasket. Reviewed through Epic chart. Please call patient/caregiver to let know that 2D echo preserved heart function with normal EF, continued diastolic dysfunction, no new significant valvular disease. Continue plan as discussed. Dayna Dunn PA-C

## 2013-10-31 NOTE — Telephone Encounter (Signed)
Pt.s echo information/results given to caregiver

## 2013-11-05 ENCOUNTER — Ambulatory Visit: Payer: Medicare HMO | Admitting: Cardiology

## 2013-11-06 ENCOUNTER — Ambulatory Visit: Payer: Medicare HMO | Admitting: Cardiovascular Disease

## 2013-11-14 ENCOUNTER — Encounter: Payer: Self-pay | Admitting: *Deleted

## 2013-11-14 ENCOUNTER — Telehealth: Payer: Self-pay | Admitting: *Deleted

## 2013-11-14 NOTE — Telephone Encounter (Signed)
Called patient's son to give lab results. No answer. Will send a letter.

## 2013-12-11 ENCOUNTER — Telehealth: Payer: Self-pay | Admitting: Cardiovascular Disease

## 2013-12-11 MED ORDER — METOPROLOL SUCCINATE ER 50 MG PO TB24
50.0000 mg | ORAL_TABLET | Freq: Every day | ORAL | Status: DC
Start: 2013-12-11 — End: 2014-10-14

## 2013-12-11 NOTE — Telephone Encounter (Signed)
Rx was sent to pharmacy electronically. 

## 2013-12-11 NOTE — Telephone Encounter (Signed)
Pt have been waiting for his Metoprolol since last Monday. Please call it in today,he is completely out of it. Please call it to [place]. Metoprolol 50 mg #30

## 2014-01-02 ENCOUNTER — Ambulatory Visit: Payer: Medicare HMO | Admitting: Cardiovascular Disease

## 2014-01-06 ENCOUNTER — Other Ambulatory Visit: Payer: Self-pay | Admitting: Pulmonary Disease

## 2014-02-24 ENCOUNTER — Other Ambulatory Visit: Payer: Self-pay | Admitting: Pulmonary Disease

## 2014-02-25 ENCOUNTER — Other Ambulatory Visit: Payer: Self-pay | Admitting: *Deleted

## 2014-02-25 MED ORDER — POTASSIUM CHLORIDE CRYS ER 20 MEQ PO TBCR: 20.0000 meq | EXTENDED_RELEASE_TABLET | Freq: Every day | ORAL | Status: DC

## 2014-03-18 ENCOUNTER — Telehealth: Payer: Self-pay | Admitting: Cardiovascular Disease

## 2014-03-18 ENCOUNTER — Ambulatory Visit (INDEPENDENT_AMBULATORY_CARE_PROVIDER_SITE_OTHER): Payer: Medicare HMO | Admitting: Cardiology

## 2014-03-18 ENCOUNTER — Ambulatory Visit
Admission: RE | Admit: 2014-03-18 | Discharge: 2014-03-18 | Disposition: A | Discharge status: Home / Self Care | Payer: Medicare HMO | Source: Ambulatory Visit | Attending: Cardiology | Admitting: Cardiology

## 2014-03-18 ENCOUNTER — Encounter: Payer: Self-pay | Admitting: Cardiology

## 2014-03-18 ENCOUNTER — Telehealth: Payer: Self-pay | Admitting: Pulmonary Disease

## 2014-03-18 VITALS — BP 132/74 | HR 73 | Ht 72.0 in | Wt 189.0 lb

## 2014-03-18 DIAGNOSIS — E785 Hyperlipidemia, unspecified: Secondary | ICD-10-CM

## 2014-03-18 DIAGNOSIS — I5033 Acute on chronic diastolic (congestive) heart failure: Secondary | ICD-10-CM

## 2014-03-18 DIAGNOSIS — I2510 Atherosclerotic heart disease of native coronary artery without angina pectoris: Secondary | ICD-10-CM

## 2014-03-18 DIAGNOSIS — I10 Essential (primary) hypertension: Secondary | ICD-10-CM

## 2014-03-18 LAB — CBC WITH DIFFERENTIAL/PLATELET
Basophils Absolute: 0 10*3/uL (ref 0.0–0.1)
Basophils Relative: 0 % (ref 0–1)
Eosinophils Absolute: 0.2 10*3/uL (ref 0.0–0.7)
Eosinophils Relative: 2 % (ref 0–5)
HCT: 45.8 % (ref 39.0–52.0)
HEMOGLOBIN: 15.8 g/dL (ref 13.0–17.0)
LYMPHS ABS: 2.2 10*3/uL (ref 0.7–4.0)
LYMPHS PCT: 20 % (ref 12–46)
MCH: 32 pg (ref 26.0–34.0)
MCHC: 34.5 g/dL (ref 30.0–36.0)
MCV: 92.7 fL (ref 78.0–100.0)
Monocytes Absolute: 1 10*3/uL (ref 0.1–1.0)
Monocytes Relative: 9 % (ref 3–12)
NEUTROS ABS: 7.6 10*3/uL (ref 1.7–7.7)
Neutrophils Relative %: 69 % (ref 43–77)
PLATELETS: 216 10*3/uL (ref 150–400)
RBC: 4.94 MIL/uL (ref 4.22–5.81)
RDW: 13.6 % (ref 11.5–15.5)
WBC: 11 10*3/uL — ABNORMAL HIGH (ref 4.0–10.5)

## 2014-03-18 LAB — BASIC METABOLIC PANEL
BUN: 35 mg/dL — ABNORMAL HIGH (ref 6–23)
CHLORIDE: 95 meq/L — AB (ref 96–112)
CO2: 34 mEq/L — ABNORMAL HIGH (ref 19–32)
Calcium: 9.4 mg/dL (ref 8.4–10.5)
Creat: 1.71 mg/dL — ABNORMAL HIGH (ref 0.50–1.35)
Glucose, Bld: 139 mg/dL — ABNORMAL HIGH (ref 70–99)
POTASSIUM: 4 meq/L (ref 3.5–5.3)
Sodium: 140 mEq/L (ref 135–145)

## 2014-03-18 LAB — TSH: TSH: 1.494 u[IU]/mL (ref 0.350–4.500)

## 2014-03-18 NOTE — Telephone Encounter (Signed)
lmtcb

## 2014-03-18 NOTE — Telephone Encounter (Signed)
Noted and patient arrived for appt.

## 2014-03-18 NOTE — Telephone Encounter (Signed)
Pt has appt today at 25 with Dr. Peter Martinique.  Pt will need to keep this appt per SN.

## 2014-03-18 NOTE — Progress Notes (Signed)
[facility], [place], [place]  [address] Phone: [phone] Fax:  [phone]  Date:  03/18/2014   Patient ID:  Billy Morgan, Billy Morgan July 19, 1918, MRN [id number]   PCP:  Noralee Space, MD  Cardiologist: Dr. Claiborne Billings  History of Present Illness: FRUTOSO DIMARE is a 80 y.o. male with history of Alzheimer's dementia, chronic diastolic CHF, CAD s/p CABG 1997 with low risk nuc 07/2012, dementia, CKD stage III (baseline report 1.61.8) who is seen as a work in today. His caregiver reports that he is much more SOB. Abdominal girth has increased. Baseline weight 183 lbs but recently weight has increased. He received one dose of metolazone last week with only transient effect. Oxygen levels have been 95-97%. No lower extremity edema. Normally takes torsemide 20 mg daily but may take an extra dose for increased swelling.   Normal albumin 3/20.   No chest pain, syncope, LEE.  Compliant with salt restriction.   Recent Labs: 03/22/2013: Hemoglobin 13.5; Pro B Natriuretic peptide (BNP) 554.5*  10/17/2013: ALT 15; Creatinine 1.68*; HDL Cholesterol by NMR 33*; LDL (calc) 66; Potassium 4.6   Wt Readings from Last 3 Encounters:  03/18/14 189 lb (85.73 kg)  10/22/13 185 lb 9.6 oz (84.188 kg)  10/06/13 185 lb 3.2 oz (84.006 kg)     Past Medical History  Diagnosis Date  . Unspecified hearing loss   . HTN (hypertension)   . CAD (coronary artery disease)     a. 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 b. Low risk Myoview 1/14.  Marland Kitchen Peripheral vascular disease, unspecified   . Venous insufficiency   . Other and unspecified hyperlipidemia   . Esophageal reflux   . Irritable bowel syndrome   . Osteoarthrosis, unspecified whether generalized or localized, unspecified site   . Anxiety state, unspecified   . Malignant neoplasm of prostate   . Dementia   . Anasarca 09/09/2012  . Hx of echocardiogram 2013    EF 55%-65% with grade 1 diatolic dyfunction.  Marland Kitchen History of stress test 07/2012    normal perfusion without scar or  ischemia.  . CKD (chronic kidney disease), stage III     a. Hx of more acute RF in 2013 - Cr 2.75. b. Baseline ~1.6.  . Chronic diastolic CHF (congestive heart failure)   . Prostate cancer   . Chronic edema     Current Outpatient Prescriptions  Medication Sig Dispense Refill  . acetaminophen (TYLENOL) 650 MG CR tablet Take 650 mg by mouth every 8 (eight) hours as needed for pain (for pain).      Marland Kitchen aspirin 81 MG tablet Take 81 mg by mouth daily.        Marland Kitchen donepezil (ARICEPT) 5 MG tablet take 1 tablet by mouth at bedtime  30 tablet  3  . hydrALAZINE (APRESOLINE) 25 MG tablet Take 25 mg by mouth 2 (two) times daily.      . isosorbide mononitrate (IMDUR) 60 MG 24 hr tablet 30 mg every morning and 60 mg every evening  45 tablet  6  . ketoconazole (NIZORAL) 2 % cream Apply 1 application topically daily. To left side of face, behind ears, and to scalp      . Melatonin 5 MG TABS Take 2 tablets by mouth at bedtime.      . metolazone (ZAROXOLYN) 2.5 MG tablet Take 2.5 mg by mouth as needed.      . metoprolol succinate (TOPROL-XL) 50 MG 24 hr tablet Take 1 tablet (50 mg  total) by mouth daily.  30 tablet  6  . Multiple Vitamin (MULTIVITAMIN WITH MINERALS) TABS Take 2 tablets by mouth daily.      . nitroGLYCERIN (NITROSTAT) 0.4 MG SL tablet Place 0.4 mg under the tongue every 5 (five) minutes as needed. For chest pain      . potassium chloride SA (K-DUR,KLOR-CON) 20 MEQ tablet Take 1 tablet (20 mEq total) by mouth daily.  30 tablet  6  . rosuvastatin (CRESTOR) 20 MG tablet Take 1 tablet (20 mg total) by mouth daily.  30 tablet  10  . sertraline (ZOLOFT) 25 MG tablet take 1 tablet by mouth once daily  30 tablet  1  . torsemide (DEMADEX) 20 MG tablet Take 1 tablet (20 mg total) by mouth daily. May take extra dose (20mg ) no more than 3 times per week as needed for wt gain  45 tablet  6   No current facility-administered medications for this visit.    Allergies:   Sulfamethoxazole   Social History:  The  patient  reports that he has never smoked. He has never used smokeless tobacco. He reports that he does not drink alcohol or use illicit drugs.   Family History:  The patient's family history includes Heart disease in his father; Hypertension in his mother. There is no history of Colon cancer.   ROS:  Please see the history of present illness.  All other systems reviewed and negative.   PHYSICAL EXAM: VS:  BP 132/74  Pulse 73  Ht 6' (1.829 m)  Wt 189 lb (85.73 kg)  BMI 25.63 kg/m2 Well nourished, well developed, elderly male in no acute distress HEENT: normal Neck: 4 cm JVD at 30 degrees Cardiac:  normal S1, S2; RRR; no murmur Lungs:  Rales in right base Abd: soft, nontender, no hepatomegaly. Mildly distended with ? Ascites. Ext: no edema Skin: warm and dry Neuro:  moves all extremities spontaneously, hard of hearing with hearing aids in  Laboratory data:  Lab Results  Component Value Date   WBC 9.3 03/22/2013   HGB 13.5 03/22/2013   HCT 38.6* 03/22/2013   PLT 179 03/22/2013   GLUCOSE 103* 10/17/2013   CHOL 139 10/17/2013   TRIG 198* 10/17/2013   HDL 33* 10/17/2013   LDLDIRECT 176.3 04/01/2009   LDLCALC 66 10/17/2013   ALT 15 10/17/2013   AST 25 10/17/2013   NA 138 10/17/2013   K 4.6 10/17/2013   CL 98 10/17/2013   CREATININE 1.68* 10/17/2013   BUN 25* 10/17/2013   CO2 30 10/17/2013   TSH 0.72 11/07/2012   PSA 1.72 08/02/2011   INR 1.05 09/09/2012   Echo: 10/29/13:Study Conclusions  - Left ventricle: The cavity size was normal. Wall thickness was normal. Systolic function was normal. The estimated ejection fraction was in the range of 55% to 60%. Wall motion was normal; there were no regional wall motion abnormalities. Doppler parameters are consistent with abnormal left ventricular relaxation (grade 1 diastolic dysfunction). - Aortic valve: Trivial regurgitation. Impressions:  - Compared to 07/04/12, no significant change.    ASSESSMENT AND PLAN:  1. Acute on Chronic  diastolic CHF: Weight is up. More symptomatic.  Will check CXR today and update labs with CBC, BMET, BNP and TSH.  Echo shows normal LV function. Will increase Torsemide to 40 mg daily until weight returns to baseline. Recommend follow up with Dr. Claiborne Billings in 3-4 weeks. She is to call if weight increases further. 2. CKD stage III: repeat BMET today. 3.  CAD s/p remote CABG; normal myoview 07/2012: continue aspirin, BB, statin. No recent CP.  4. HTN: well controlled. 5. Dementia: at baseline today.    Signed, Peter Martinique MD, [facility]  03/18/2014 11:50 AM

## 2014-03-18 NOTE — Telephone Encounter (Signed)
Spoke with patient's caregiver. Patient is extremely SOB. Appointment given to see Dr. Peter Martinique @ 11:00 am for evaluation.

## 2014-03-18 NOTE — Telephone Encounter (Signed)
Billy Morgan states that [name]'s O2 sat is 97 and he is very short of breath.  Please call.

## 2014-03-18 NOTE — Patient Instructions (Signed)
We will check lab work and a chest Xray today.  Increase torsemide to 40 mg daily for 3-4 days until weight is back to baseline.  Continue sodium restriction.

## 2014-03-18 NOTE — Telephone Encounter (Signed)
Billy Morgan called back-states Mr Stgermaine needs to be seen as Cardiology can not see him for another 2 days. Pt is having Increased SOB and Increased swelling-pt is having to take extra Fluid pill daily and O2 is holding steady at 97%.   SN please advise.

## 2014-03-19 ENCOUNTER — Telehealth: Payer: Self-pay | Admitting: Cardiovascular Disease

## 2014-03-19 LAB — BRAIN NATRIURETIC PEPTIDE: Brain Natriuretic Peptide: 47.8 pg/mL (ref 0.0–100.0)

## 2014-03-19 NOTE — Telephone Encounter (Signed)
Left message on caregiver's [name]) voicemail of results that was previously given to son. Instructed to call back if questions or concerns.

## 2014-03-19 NOTE — Telephone Encounter (Signed)
Please call,need to get his lab results,his son did not understand the results.

## 2014-03-20 ENCOUNTER — Telehealth: Payer: Self-pay | Admitting: Cardiovascular Disease

## 2014-03-20 NOTE — Telephone Encounter (Signed)
Returned call to patient's daughter Lovey Newcomer lab results given.

## 2014-03-20 NOTE — Telephone Encounter (Signed)
She would like to get the last lab wirk results,says she wants the actual numbers.

## 2014-03-23 ENCOUNTER — Ambulatory Visit: Payer: Medicare HMO | Admitting: Cardiology

## 2014-03-27 ENCOUNTER — Other Ambulatory Visit: Payer: Self-pay | Admitting: *Deleted

## 2014-03-27 ENCOUNTER — Telehealth: Payer: Self-pay | Admitting: Cardiovascular Disease

## 2014-03-27 ENCOUNTER — Encounter: Payer: Self-pay | Admitting: Cardiovascular Disease

## 2014-03-27 MED ORDER — TORSEMIDE 20 MG PO TABS: 20.0000 mg | ORAL_TABLET | Freq: Every day | ORAL | Status: DC

## 2014-03-27 NOTE — Telephone Encounter (Signed)
Rx was sent to pharmacy electronically. 

## 2014-03-30 NOTE — Telephone Encounter (Signed)
Closed enounter °

## 2014-04-09 ENCOUNTER — Ambulatory Visit: Payer: Medicare HMO | Admitting: Cardiovascular Disease

## 2014-04-10 ENCOUNTER — Other Ambulatory Visit: Payer: Self-pay | Admitting: *Deleted

## 2014-04-10 MED ORDER — HYDRALAZINE HCL 25 MG PO TABS: 25.0000 mg | ORAL_TABLET | Freq: Two times a day (BID) | ORAL | Status: DC

## 2014-04-10 NOTE — Telephone Encounter (Signed)
Rx refill sent to patient pharmacy   

## 2014-04-13 ENCOUNTER — Ambulatory Visit (INDEPENDENT_AMBULATORY_CARE_PROVIDER_SITE_OTHER): Payer: Medicare HMO | Admitting: Cardiovascular Disease

## 2014-04-13 ENCOUNTER — Encounter: Payer: Self-pay | Admitting: Cardiovascular Disease

## 2014-04-13 VITALS — BP 96/70 | HR 63 | Ht 72.0 in | Wt 188.6 lb

## 2014-04-13 DIAGNOSIS — H9190 Unspecified hearing loss, unspecified ear: Secondary | ICD-10-CM

## 2014-04-13 DIAGNOSIS — K219 Gastro-esophageal reflux disease without esophagitis: Secondary | ICD-10-CM

## 2014-04-13 DIAGNOSIS — I10 Essential (primary) hypertension: Secondary | ICD-10-CM

## 2014-04-13 DIAGNOSIS — E785 Hyperlipidemia, unspecified: Secondary | ICD-10-CM

## 2014-04-13 DIAGNOSIS — I2510 Atherosclerotic heart disease of native coronary artery without angina pectoris: Secondary | ICD-10-CM

## 2014-04-13 NOTE — Patient Instructions (Signed)
Your physician wants you to follow-up in:  6 months. You will receive a reminder letter in the mail two months in advance. If you don't receive a letter, please call our office to schedule the follow-up appointment.   

## 2014-04-13 NOTE — Progress Notes (Signed)
Patient ID: Billy Morgan, male   DOB: 28-Jun-1918, 80 y.o.   MRN: [id number]     HPI: Billy Morgan, is a 80 y.o. male who presents for cardiology followup evaluation.  I last saw approximately one year ago.  Apparently, he was seen by Dr. Martinique one month ago.  He presents for re-evaluation.  Billy Morgan has known coronary artery disease and underwent CABG revascularization surgery in 1997 . He has chronic renal insufficiency, history of prostate cancer, diastolic dysfunction, venous insufficiency, and lower extremity edema.  Last year, had difficulty with diarrhea and required several medications for ultimate stabilization.  His last nuclear perfusion study was in January 2014 which showed normal perfusion and function.  He was recently seen one month ago by Dr. Martinique with complaints of increasing leg swelling.  At that time, adjustments were made to his medical regimen and he was advised to increase his torsemide says that he Billy take an extra 20 mg dose with present dose of 20 mg as needed.  He also is a prescription for metolazone, but has not taken this.  His leg swelling did improve.  I have reviewed laboratory that it had done in March 2015 which rhetorical to 139, triglycerides 198, HDL was low at 33, LDL 66.  Recent laboratory August Dr. Martinique showed a TSH of 1.49.  He did have a creatinine of 1.71 in the past.  This has been as high as 1.97 in March 2015, which subsequently improved to 1.68.  He denies chest pain.  He denies PND or orthopnea.  He is unaware of palpitations.  He does have dementia.  He is hard of hearing and has bilateral hearing aids.  Past Medical History  Diagnosis Date  . Unspecified hearing loss   . HTN (hypertension)   . CAD (coronary artery disease)     a. 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 b. Low risk Myoview 1/14.  Marland Kitchen Peripheral vascular disease, unspecified   . Venous insufficiency   . Other and unspecified hyperlipidemia   . Esophageal reflux   . Irritable bowel syndrome     . Osteoarthrosis, unspecified whether generalized or localized, unspecified site   . Anxiety state, unspecified   . Malignant neoplasm of prostate   . Dementia   . Anasarca 09/09/2012  . Hx of echocardiogram 2013    EF 55%-65% with grade 1 diatolic dyfunction.  Marland Kitchen History of stress test 07/2012    normal perfusion without scar or ischemia.  . CKD (chronic kidney disease), stage III     a. Hx of more acute RF in 2013 - Cr 2.75. b. Baseline ~1.6.  . Chronic diastolic CHF (congestive heart failure)   . Prostate cancer   . Chronic edema     Past Surgical History  Procedure Laterality Date  . Inguinal hernia repair    . Appendectomy    . Coronary artery bypass graft  1997  . Coronary angioplasty with stent placement      Allergies  Allergen Reactions  . Sulfamethoxazole     REACTION: unspecified    Current Outpatient Prescriptions  Medication Sig Dispense Refill  . acetaminophen (TYLENOL) 650 MG CR tablet Take 650 mg by mouth every 8 (eight) hours as needed for pain (for pain).      Marland Kitchen aspirin 81 MG tablet Take 81 mg by mouth daily.        Marland Kitchen donepezil (ARICEPT) 5 MG tablet take 1 tablet by mouth at bedtime  30 tablet  3  . hydrALAZINE (APRESOLINE) 25 MG tablet Take 1 tablet (25 mg total) by mouth 2 (two) times daily.  90 tablet  2  . isosorbide mononitrate (IMDUR) 60 MG 24 hr tablet 30 mg every morning and 60 mg every evening  45 tablet  6  . ketoconazole (NIZORAL) 2 % cream Apply 1 application topically daily. To left side of face, behind ears, and to scalp      . Melatonin 5 MG TABS Take 2 tablets by mouth at bedtime.      . metolazone (ZAROXOLYN) 2.5 MG tablet Take 2.5 mg by mouth as needed.      . metoprolol succinate (TOPROL-XL) 50 MG 24 hr tablet Take 1 tablet (50 mg total) by mouth daily.  30 tablet  6  . Multiple Vitamin (MULTIVITAMIN WITH MINERALS) TABS Take 2 tablets by mouth daily.      . nitroGLYCERIN (NITROSTAT) 0.4 MG SL tablet Place 0.4 mg under the tongue every 5  (five) minutes as needed. For chest pain      . potassium chloride SA (K-DUR,KLOR-CON) 20 MEQ tablet Take 1 tablet (20 mEq total) by mouth daily.  30 tablet  6  . rosuvastatin (CRESTOR) 20 MG tablet Take 1 tablet (20 mg total) by mouth daily.  30 tablet  10  . sertraline (ZOLOFT) 25 MG tablet take 1 tablet by mouth once daily  30 tablet  1  . torsemide (DEMADEX) 20 MG tablet Take 1 tablet (20 mg total) by mouth daily. May take extra dose (20mg ) up to 3 times per week as needed for weight gain  45 tablet  1   No current facility-administered medications for this visit.    History   Social History  . Marital Status: Widowed    Spouse Name: N/A    Number of Children: 4  . Years of Education: N/A   Occupational History  . retired    Social History Main Topics  . Smoking status: Never Smoker   . Smokeless tobacco: Never Used  . Alcohol Use: No  . Drug Use: No  . Sexual Activity: Not on file   Other Topics Concern  . Not on file   Social History Narrative  . No narrative on file    Family History  Problem Relation Age of Onset  . Hypertension Mother   . Heart disease Father   . Colon cancer Neg Hx    ROS General: Negative; No fevers, chills, or night sweats;  HEENT: He has bilateral hearing aids for hearing difficulty sinus congestion, difficulty swallowing Pulmonary: Negative; No cough, wheezing, shortness of breath, hemoptysis Cardiovascular: Negative; No chest pain, presyncope, syncope, palpitations Positive for intermittent leg swelling, which has improved GI: Negative; No nausea, vomiting, diarrhea, or abdominal pain GU: Negative; No dysuria, hematuria, or difficulty voiding Musculoskeletal: Negative; no myalgias, joint pain, or weakness Hematologic/Oncology: Negative; no easy bruising, bleeding Endocrine: Negative; no heat/cold intolerance; no diabetes Neuro: Positive for mild dementia.; no changes in balance, headaches Skin: Negative; No rashes or skin  lesions Psychiatric: Negative; No behavioral problems, depression Sleep: Negative; No snoring, daytime sleepiness, hypersomnolence, bruxism, restless legs, hypnogognic hallucinations, no cataplexy Other comprehensive 14 point system review is negative.   PE BP 96/70  Pulse 63  Ht 6' (1.829 m)  Wt 188 lb 9.6 oz (85.548 kg)  BMI 25.57 kg/m2  Repeat blood pressure by me was 124/70, supine, and was 120/70, sitting. General: Alert, oriented, no distress.  Skin: normal turgor, no rashes HEENT: Normocephalic,  atraumatic. Pupils round and reactive; sclera anicteric;no lid lag.  Nose without nasal septal hypertrophy Mouth/Parynx benign; Mallinpatti scale 3 Neck: No JVD, no carotid bruits Chest wall: Nontender to palpation Lungs: clear to ausculatation and percussion; no wheezing or rales Heart: RRR, s1 s2 normal 1/6 systolic murmur; no S3 or S4 gallop.  No diastolic murmur.  No heaves. Abdomen: soft, nontender; no hepatosplenomehaly, BS+; abdominal aorta nontender and not dilated by palpation. Back: No CVA tenderness Pulses 2+ Extremities: no clubbing cyanosis or edema, Homan's sign negative  Neurologic: grossly nonfocal Psychological: Normal affect today.  He is upbeat.  ECG (independently read by me): Normal sinus rhythm at 63 beats per minute.  First AV block with PR interval of 228 ms.  QTc interval 431 ms.  Prior September 2014 ECG: Sinus rhythm at 61 beats per minute per QTc interval 420 ms. Borderline first-degree block with a PR interval at 208 ms.  LABS:  BMET    Component Value Date/Time   NA 140 03/18/2014 1130   K 4.0 03/18/2014 1130   CL 95* 03/18/2014 1130   CO2 34* 03/18/2014 1130   GLUCOSE 139* 03/18/2014 1130   BUN 35* 03/18/2014 1130   CREATININE 1.71* 03/18/2014 1130   CREATININE 1.4 06/06/2013 1250   CALCIUM 9.4 03/18/2014 1130   GFRNONAA 34* 03/22/2013 1305   GFRAA 39* 03/22/2013 1305     Hepatic Function Panel     Component Value Date/Time   PROT 7.3 10/17/2013  1335   ALBUMIN 3.9 10/17/2013 1335   AST 25 10/17/2013 1335   ALT 15 10/17/2013 1335   ALKPHOS 56 10/17/2013 1335   BILITOT 0.7 10/17/2013 1335   BILIDIR 0.2 11/07/2012 1728   IBILI 0.6 09/12/2012 1020     CBC    Component Value Date/Time   WBC 11.0* 03/18/2014 1130   RBC 4.94 03/18/2014 1130   HGB 15.8 03/18/2014 1130   HCT 45.8 03/18/2014 1130   PLT 216 03/18/2014 1130   MCV 92.7 03/18/2014 1130   MCH 32.0 03/18/2014 1130   MCHC 34.5 03/18/2014 1130   RDW 13.6 03/18/2014 1130   LYMPHSABS 2.2 03/18/2014 1130   MONOABS 1.0 03/18/2014 1130   EOSABS 0.2 03/18/2014 1130   BASOSABS 0.0 03/18/2014 1130     BNP    Component Value Date/Time   PROBNP 554.5* 03/22/2013 1313    Lipid Panel     Component Value Date/Time   CHOL 139 10/17/2013 1335   TRIG 198* 10/17/2013 1335   HDL 33* 10/17/2013 1335   CHOLHDL 4.2 10/17/2013 1335   VLDL 40 10/17/2013 1335   LDLCALC 66 10/17/2013 1335     RADIOLOGY: No results found.    ASSESSMENT AND PLAN:  Billy Morgan is a 80 year old white male who is 18 years status post CABG revascularization surgery. He has renal insufficiency and most recent creatinine was 1.71 with a BUN of 35.  His peripheral edema has resolved.  He is not taking metolazone.  And has not required extra torsemide recently.  He denies anginal symptoms with reference to his coronary obstructive disease.  He is on nitrates and hydralazine.  An echo Doppler study in December 2013 showed an EF of 55-65% with grade 1 diastolic dysfunction.  Presently, he is not having any overt signs of CHF.  He's not having any arrhythmia.  His hypertension is controlled.  Is not having any orthostatic hypotension. His dementia is at baseline, for which she is on Aricept 5 mg at  bedtime.  He is tolerating Crestor 20 mg for hyperlipidemia with an LDL in March 2000 1566.  However, advised reduction in carbohydrate intake.  He walks with a cane.  Troy Sine, MD, [facility]  04/13/2014 3:14 PM

## 2014-05-06 ENCOUNTER — Other Ambulatory Visit: Payer: Self-pay

## 2014-05-06 ENCOUNTER — Other Ambulatory Visit: Payer: Self-pay | Admitting: Pulmonary Disease

## 2014-05-06 MED ORDER — ROSUVASTATIN CALCIUM 20 MG PO TABS: 20.0000 mg | ORAL_TABLET | Freq: Every day | ORAL | Status: AC

## 2014-05-06 MED ORDER — ISOSORBIDE MONONITRATE ER 60 MG PO TB24: ORAL_TABLET | ORAL | Status: DC

## 2014-05-06 NOTE — Telephone Encounter (Signed)
Rx sent to pharmacy   

## 2014-05-18 ENCOUNTER — Telehealth: Payer: Self-pay | Admitting: Pulmonary Disease

## 2014-05-18 MED ORDER — SERTRALINE HCL 25 MG PO TABS: ORAL_TABLET | ORAL | Status: DC

## 2014-05-18 MED ORDER — DONEPEZIL HCL 5 MG PO TABS: ORAL_TABLET | ORAL | Status: DC

## 2014-05-18 NOTE — Telephone Encounter (Signed)
Called spoke with pt nurse Trisha. Pt is out of zoloft and aricept. RX sent in. Pt scheduled yearly follow up with SN on 10/26. Nothing further needed

## 2014-05-25 ENCOUNTER — Ambulatory Visit (INDEPENDENT_AMBULATORY_CARE_PROVIDER_SITE_OTHER): Payer: Medicare HMO | Admitting: Pulmonary Disease

## 2014-05-25 ENCOUNTER — Encounter: Payer: Self-pay | Admitting: Pulmonary Disease

## 2014-05-25 ENCOUNTER — Other Ambulatory Visit (INDEPENDENT_AMBULATORY_CARE_PROVIDER_SITE_OTHER): Payer: Medicare HMO

## 2014-05-25 VITALS — BP 124/60 | HR 72 | Temp 97.9°F | Ht 72.0 in | Wt 188.0 lb

## 2014-05-25 DIAGNOSIS — I739 Peripheral vascular disease, unspecified: Secondary | ICD-10-CM

## 2014-05-25 DIAGNOSIS — I872 Venous insufficiency (chronic) (peripheral): Secondary | ICD-10-CM

## 2014-05-25 DIAGNOSIS — M150 Primary generalized (osteo)arthritis: Secondary | ICD-10-CM

## 2014-05-25 DIAGNOSIS — E785 Hyperlipidemia, unspecified: Secondary | ICD-10-CM

## 2014-05-25 DIAGNOSIS — C61 Malignant neoplasm of prostate: Secondary | ICD-10-CM

## 2014-05-25 DIAGNOSIS — R0600 Dyspnea, unspecified: Secondary | ICD-10-CM

## 2014-05-25 DIAGNOSIS — K219 Gastro-esophageal reflux disease without esophagitis: Secondary | ICD-10-CM

## 2014-05-25 DIAGNOSIS — N183 Chronic kidney disease, stage 3 unspecified: Secondary | ICD-10-CM

## 2014-05-25 DIAGNOSIS — I10 Essential (primary) hypertension: Secondary | ICD-10-CM

## 2014-05-25 DIAGNOSIS — F411 Generalized anxiety disorder: Secondary | ICD-10-CM

## 2014-05-25 DIAGNOSIS — R413 Other amnesia: Secondary | ICD-10-CM

## 2014-05-25 DIAGNOSIS — I5033 Acute on chronic diastolic (congestive) heart failure: Secondary | ICD-10-CM

## 2014-05-25 DIAGNOSIS — K589 Irritable bowel syndrome without diarrhea: Secondary | ICD-10-CM

## 2014-05-25 DIAGNOSIS — I2510 Atherosclerotic heart disease of native coronary artery without angina pectoris: Secondary | ICD-10-CM

## 2014-05-25 DIAGNOSIS — M159 Polyosteoarthritis, unspecified: Secondary | ICD-10-CM

## 2014-05-25 DIAGNOSIS — Z23 Encounter for immunization: Secondary | ICD-10-CM

## 2014-05-25 LAB — BASIC METABOLIC PANEL
BUN: 44 mg/dL — AB (ref 6–23)
CO2: 31 meq/L (ref 19–32)
Calcium: 9.7 mg/dL (ref 8.4–10.5)
Chloride: 93 mEq/L — ABNORMAL LOW (ref 96–112)
Creatinine, Ser: 2.3 mg/dL — ABNORMAL HIGH (ref 0.4–1.5)
GFR: 29 mL/min — AB (ref >60.00)
Glucose, Bld: 123 mg/dL — ABNORMAL HIGH (ref 70–99)
Potassium: 3.1 mEq/L — ABNORMAL LOW (ref 3.5–5.1)
Sodium: 138 mEq/L (ref 135–145)

## 2014-05-25 LAB — HEPATIC FUNCTION PANEL
ALT: 16 U/L (ref 0–53)
AST: 31 U/L (ref 0–37)
Albumin: 3.7 g/dL (ref 3.5–5.2)
Alkaline Phosphatase: 48 U/L (ref 39–117)
Bilirubin, Direct: 0.2 mg/dL (ref 0.0–0.3)
Total Bilirubin: 1.2 mg/dL (ref 0.2–1.2)
Total Protein: 8.2 g/dL (ref 6.0–8.3)

## 2014-05-25 LAB — BRAIN NATRIURETIC PEPTIDE: PRO B NATRI PEPTIDE: 42 pg/mL (ref 0.0–100.0)

## 2014-05-25 MED ORDER — DONEPEZIL HCL 5 MG PO TABS: ORAL_TABLET | ORAL | Status: AC

## 2014-05-25 MED ORDER — SERTRALINE HCL 25 MG PO TABS: ORAL_TABLET | ORAL | Status: AC

## 2014-05-25 NOTE — Patient Instructions (Signed)
Today we updated your med list in our EPIC system...    Continue your current medications the same...  We refilled your meds per request...  We gave you the 2015 Flu vaccine today...  Today we did your follow up blood work...    We will contact you w/ the results when available...   Call for any questions.Marland KitchenMarland Kitchen

## 2014-05-25 NOTE — Progress Notes (Addendum)
Subjective:    Patient ID: Billy Morgan, male    DOB: [birth year], 80 y.o.   MRN: [id number]  HPI [name] here for a follow up visit... he has multiple medical problems as noted below...   ~  August 16, 2010:  16 month ROV & review> he is quite remarkable for 80 y/o- still lives in own home & cares for wife w/ help of CNA's that they hire in shifts, his son supervises everyting but confirms that he is doing satis... pt has no new complaints or concerns... +HOH;  BP controlled on meds;  denies CP, palpit, SOB, edema, etc;  Chol looks good on Simva40;  GI & GU are stable;  DJD is mod & well managed by the diclofenac...   ~  January 23, 2011:  67mo ROV & he continues to do remarkably well for 89, no new complaints or concerns; he remains active, in his own home w/ caregivers helping daily, family nearby & on his own at night w/o problems identified;  Wife passed away 11-18-22 on home hospice after hosp 3/12 w/ stroke etc.    Followed by Billy Morgan for [facility], seen 2/12 & his note reviewed; Myoview was neg- no scar or ischemia & EF=74%; no changes made...  ~  August 02, 2011:  64mo ROV & Billy Morgan is c/o URI "stopped up" he says; notes "chest cold" x 3d w/ chest congestion, "tight", mild cough, yellow phlegm, hoarse; denies f/c/s, SOB, edema; he had Flu vaccine 11/12;  We discussed RX w/ Depo120, ZPak, Mucinex, Fluids, Tylenol, etc...    He reports going to ER 9/12 w/ left flank pain per ER notes reviewed> Exam was neg; Urine was clear; Labs were essentially wnl (K 3.4, Creat 1.5, WBC 10.5); CT Abd done & no acute prob ident (see below for mult incidental findings in the 80 y/o man); pain was felt to be musculoskeletal & given Vicodin> he improved over time & resolved...     HBP> on MetoprololER50-1/2Bid, Felodipine10, Demadex20- DrKelly incr to 2/d & extra if needed; BP= 118/60 & they bring an extensive BP log but ave pressures are ok & he denies CP, palpit, dizzy, syncope, SOB; they report some edema which prompted  drKelly to incr the diuretic but Creat up to 2.0 today & cautioned to minimize the diuretic- avoid sodium, elev legs, wear support hose...    CAD, s/p CABG x5 in 1997> on ASA81+ above meds, followed by [facility] & seen 10/12, stable & no changes made; son tells me he was recently seen w/ incr edema & weight (wt today= 190# up 11#)==> told to increase the Demadex & we will [facility] of [facility] (note BNP today= 229)...    PAD> on ASA81 & followed by [facility]; prev sonar & CTAbd revealed extensive atherosclerotic changes, tortuous Ao & iliacs, no aneurysm...    Hyperlipid> on Crestor20 now; prev on Simva40 & ?changed by [facility]?; last FLP on Simva 1/12 as noted below, not fasting today...    GI> GERD, IBS> on Prilosec OTC prn & Miralax prn; they deny GI issues at present...    GU> hx prostate ca & renal insuffic> treated w/ radium seed implants in 2000; slowly rising PSA noted but we don't have notes from [facility]); ?when he was last seen?  As noted he has mild renal insuffic w/ baseline Creat ~1.5-1.6 but labs today 2.0 after incr in diuretic; asked to decr Demadex back to prev level & caution w/ NSAIDs.Billy KitchenMarland Morgan  DJD> on Voltaren 50Bid as needed & asked to minimize the NSAID rx, use Tylenol first...    Memory loss/ anxiety> on Xanax0.25 prn; he manages pretty well for 89 w/ son's help...  ~  November 13, 2012:  88mo ROV & Billy Morgan has been going downhill- saw TP 11/09/12 w/ family noting memory loss, confusion, agitation, etc & now required 24h care ([place]); they tried [facility] [facility] but they state INTOL (too sedated even w/ 1/2 tab); per care giver he is depressed, tearful, and we decided to try Sertraline 25=>50mg /d... He is followed very freq by DrKelly/ [facility] & was [facility] for CHF in Feb2014, diuresed w/ mult diuretics, and he notes labs done every week by [facility] & he doesn't want additional lab work done here...     HBP> on MetoprololER25Qhs, Apres25Tid, Demadex20-2Bid, K20; BP= 104/64 & he  denies CP, palpit, dizzy, syncope, SOB; edema managed by [facility] w/ freq adjustments in his diuretic regimen...    CAD, s/p CABG x5 in 1997, CHF> on ASA81+ Imdur-30am&60pm, followed by [facility] & he was hosp 2/14 w/ CHF (mostly diastolic) & since then he has had freq (wkly) lab draws at [facility] office...     PAD> on ASA81 & followed by [facility]; prev sonar & CTAbd revealed extensive atherosclerotic changes, tortuous Ao & iliacs, no aneurysm...    Hyperlipid> on Crestor20 now; prev on Simva40 & ?changed by [facility]?; last FLP on Simva 1/12 as noted below, not fasting today...    GI> GERD, IBS> on Prilosec OTC prn & Miralax prn; they deny GI issues at present...    GU> hx prostate ca & renal insuffic> treated w/ radium seed implants in 2000; slowly rising PSA noted but we don't have notes from [facility]); ?when he was last seen?  As noted he has mild renal insuffic w/ baseline Creat ~1.5-1.6 but labs recently 2.2 after incr in diuretic; asked to decr Demadex back to prev level & caution w/ NSAIDs...    DJD> on Voltaren 50Bid as needed & asked to minimize the NSAID rx, use Tylenol first...    Memory loss/ anxiety/ depression> on Aricept5, Xanax0.25 prn; using Melatonin OTC for sleep (it helps); they note INTL Seroquel- we will try Sertraline 25/d... We reviewed prob list, meds, xrays and labs> see below for updates >>  CT Head 4/14 showed sm vessel dis & global atrophy, no acute changes...  LABS here 4/14:  Chems- ok x BUN=24, Cr=2.2 (up from 1.6 in hosp 2/14);  CBC- wnl x WBC=13.4;  TSH=0.72;  VitB12=293;  RPR=neg;  UA- clear...   ~  March 24, 2013:  54mo ROV & add-on for diarrhea>  Billy Morgan has had diarrhea for 1-2 weeks, started gradually, now going 3-4 times daily w/ soft mushy brown stool, not watery/ not bloody/ denies abd pain or cramping;  He has Hx GERD on Prev30 daily and IBS not on meds (prev took Miralax);  He does have some abd distention & incr gas, denies pain, n/v, blood seen;  Prev imaging  studies>  AbdXRay 03/22/13 showed nonobstructive bowel gas pattern, brachytherapy seeds in prostate, DJD & scoliosis of spine;  AbdSonar 09/11/12 showed 4mm GB polyp, atheromatous calcif in Ao, ?hepatic steatosis;  CTAbd&Pelvis 12/13 showed chr right basilar pleural thickening & calcif, brachytherapy seeds in prostate, diverticulosis, NAD...  His exam shows distended abd w/ incr BS but soft, nontender, w/o masses detected...  We discussed Rx w/ Questran starting 1/2 packet per day (in water, juice, or apple sauce), they  are already using Lomotil, and refer to GI- DrKaplan et al last colon was 8/07 showing divertics only...    We reviewed prob list, meds, xrays and labs> see below for updates >>  LABS 03/22/13 in ER>  UA= clear;  Stool CDiff- neg;  CBC- wnl;  Chems- ok x Cr=1.67 (improved);  BNP=555...  ~  May 25, 2014:  88mo ROV & Dmarco is here w/ his care giver, they note he is getting most of his regular medical care thru DrKelly's office, they are c/o "wt gain" but measured weight is the same as 58yr ago= 188# (BMI=25); we reviewed the following medical problems during today's office visit >>     HBP> on MetoprololER50, Apres25Bid, Demadex20, K20 & Zaroxyln2.5 "prn" (hasn't taken in months); BP= 124/60 & he denies CP, palpit, dizzy, syncope, SOB; edema (resolved); Today's labs w/ worsening Cr=2.3 & we decr the Demadex to 20Qod...    CAD, s/p CABG x5 in 1997, CHF> on ASA81+ Imdur-30am&60pm, followed by [facility] & last seen 9/15- CAD, s/pCABG, DD, VI, edema & CKDIII; norm nuclear study 1/14; 2DEcho 12/13 showed EF~60% & Gr1DD; no change in meds...    PAD> on ASA81 & followed by [facility]; prev sonar & CTAbd revealed extensive atherosclerotic changes, tortuous Ao & iliacs, no aneurysm...    Hyperlipid> on Crestor20 per [facility]; last FLP 3/15 showed TChol 139, TG 198, HDL 33, LDL 66 and rec to follow a better low fat diet...     GI> GERD, IBS> on Prilosec OTC prn, +Miralax prn and Tums prn; he had diarrhea last yr &  saw DrKaplan (treated w/ Cipro&Flagyl plus apple cider vinegar)- they deny GI issues at present...    GU> hx prostate ca & renal insuffic> treated w/ radium seed implants in 2000; slowly rising PSA noted but we don't have notes from [facility]); ?when he was last seen?  As noted he has mild renal insuffic w/ baseline Creat ~1.5-1.6 but labs today Cr=2.3; asked to decr Demadex20 to Qod for now & caution w/ NSAIDs...    DJD> off Voltaren & asked to minimize the NSAID rx, INTOL to Tramadol, use Tylenol first...    Memory loss/ anxiety/ depression> on Aricept5, Zoloft25 prn; using Melatonin OTC for sleep (it helps); they note INTOL Seroquel in past...  We reviewed prob list, meds, xrays and labs> see below for updates >> He was given the 2015 flu vaccine today...  LABS 10/15:  Chems- K=3.1 BS=123 BUN=44 Cr=2.3 BNP=42... PLAN>> Renal function is sl worse on Demadex20, K20, and NOT taking any of the prn Zaroxyln ("he took maybe 2 tabs all year"); Rec to incr fluid intake, avoid sodium, decrease Demadex to 20mg  Qod, keep K20 daily; plan recheck BMet in 2 weeks...  Follow up [facility] 11/12 showed Cr improved to 1.6 & rec to continue Demadex 20mg  Qod...          Problem List:   HEARING LOSS (ICD-389.9) - he is hard of hearing & has bilat hearing aides...  HYPERTENSION (ICD-401.9) - on METOPROLOL 50mg - taking 1/2Bid,  FELODIPINE 10mg /d,  TORSEMIDE 20mg - increased by [facility] to 2-4 daily ...  ~  6/12: BP= 128/62 & similar at home, takes meds regularly & tolerates well... denies HA, visual changes, CP, palipit, dizziness, syncope, dyspnea, edema, etc...  ~  12/12: BP= 118/60 & DrKelly recently increased Demadex due to swelling & wt gain; Creat up to 2.0 & asked to decr the demadex to prev level (?2daily). ~  CXR 2/14 showed heart  at upper lim for size, prior CABG, calcif pleural plaques at left base, bibasilar scarring, NAD... ~  10/15: on MetoprololER50, Apres25Bid, Demadex20, K20 & Zaroxyln2.5 "prn"  (hasn't taken in months); BP= 124/60 & he denies CP, palpit, dizzy, syncope, SOB; edema (resolved); Today's labs w/ worsening Cr=2.3 & we decr the Demadex to 20Qod.  ATHEROSCLEROTIC HEART DISEASE (ICD-414.00) - on ASA 81mg /d; followed by Billy Morgan for Cards> he had CABG x5 in 1997 by DrBartle; denies CP/ angina, palpit, SOB, etc... ~  NuclearStressTest 7/07 was neg- no ischemia or infarct... non-gated due to ectopy. ~  2DEcho 11/07 showed norm LV wall thickness & LVF, mild RA & LA dil, mild MR... ~  Myoview 2/12 was neg> no scar or ischemia, & EF=74%... ~  10/12: DrKelly's note indicates new T-inversions in III & avF not seen before, pt asymptomatic & they are following. ~  EKG 2/14 showed NSR, rate69, 1st degree AVB, otherw wnl... ~  [facility] 8/31 for diastolic heart failure> diuresed & meds adjusted- disch on Demadex20-2Bid + Plavix added (?this was later stopped by [facility]?)... ~  2/14:  DrKelly/ [place] saw pt> post hosp for CHF & diuresed- trying Lasix, Demadex, Zaroxyln; known CAD, RI, right heart failure; pt states they do lab work on him every week & he doesn't want me checking this as well... ~  9/15: on ASA81+ Imdur-30am&60pm, followed by [facility] & last seen 9/15- CAD, s/pCABG, DD, VI, edema & CKDIII; norm nuclear study 1/14; 2DEcho 12/13 showed EF~60% & Gr1DD; no change in meds  PERIPHERAL VASCULAR DISEASE (ICD-443.9) - on ASA 81mg /d and followed by [facility] as noted... ~  Abd Sonar 5/01 showed tortuous Ao w/ mod atherosclerotic changes, no aneurysm. ~  CT Abd 9/12 via ER for flank pain showed incidental finding of extensive atherosclerotic changes in Ao & iliacs, no aneurysm.  VENOUS INSUFFICIENCY (ICD-459.81)  HYPERLIPIDEMIA (ICD-272.4) - prev on Simva40, now lists CRESTOR 20mg /d (?changed by [facility]?). ~  [place] 4/07 on Simva20 showed TChol 123, TG 62, HDL 35, LDL 76... rec- continue same. ~  [place] 10/09 on Simva20 showed TChol 156, TG 84, HDL 36, LDL 103 ~  FLP 9//10 on Simva20 showed TChol 227, TG  79, HDL 36, LDL 176... ?taking regularly? incr Simva40. ~  FLP 1/12 on Simva40 showed TChol 164, TG 108, HDL 38, LDL 104 ~  1/13: Pt was changed to Cres20 in the interval (?by [facility]), not fasting today for lipid profile... ~  [place] 3/15 by [facility] on Cres20 showed TChol 139, TG 198, HDL 33, LDL 66   GERD (ICD-530.81) - uses OTC meds Prn... denies nausea, vomiting, heartburn, diarrhea, constipation, blood in stool, abdominal pain, swelling, gas...  ~  Abd sonar 2/14 showed ?hepatic steatosis, 25mm polyp in GB- no stones or wall thickening etc; calcif in Abd Ao...  IRRITABLE BOWEL SYNDROME (ICD-564.1) - on MIRALAX Prn... ~  last colonoscopy 8/07 by Dr Deatra Ina showed divertics only... ~  8/14: presented w/ diarrhea of 2wk duration, went to the ER for eval- then here- exam showed distended abd w/ incr BS but soft, nontender, w/o masses detected...  We discussed Rx w/ Questran starting 1/2 packet per day (in water, juice, or apple sauce), they are already using Lomotil, and refer to GI- DrKaplan et al last colon was 8/07 showing divertics only => diarrhea resolved w/ Cipro, Flagyl, and "apple cider vinegar"  AbdXRay 03/22/13 showed nonobstructive bowel gas pattern, brachytherapy seeds in prostate, DJD & scoliosis of spine.   AbdSonar 09/11/12 showed 40mm  GB polyp, atheromatous calcif in Ao, ?hepatic steatosis.  CTAbd&Pelvis 12/13 showed chr right basilar pleural thickening & calcif, brachytherapy seeds in prostate, diverticulosis, NAD.   PROSTATE CANCER (ICD-185) & RENAL INSUFFICIENCY (ICD-588.9) - he had radium seed implants 7/00 & is followed by Dur[facility] Reges- we don't have any recent notes... ~  labs here 8/06 showed PSA = 0.11 ~  labs 10/09 showed PSA= 0.73... BUN= 18, Creat= 1.2 ~  labs 9/10 showed PSA= 1.10... slowly increasing PSA may mean recurrent Ca- rec> f/u w/ Urology. ~  labs 1/12 showed PSA= 1.23... BUN= 22, Creat= 1.6 ~  Labs 1/13 showed PSA= 1.72... BUN= 25, Creat= 2.0.Billy KitchenMarland Morgan Rec> decr Demadex back  to prev dose (1-2 daily), minimize NSAIDs & needs f/u Urology. ~  ? If he has been following up w/ DrRDavis- no data in "Care Everywhere"...   DEGENERATIVE JOINT DISEASE (ICD-715.90) - on DICLOFENAC 50mg Bid.. he sees DrAplington/ Armed forces training and education officer at [employer]...  MEMORY LOSS (ICD-780.93) - he notes prob w/ memory... "I don't drive at night" (he got lost and son had to get him)... MMSE= 28/30 last yr... ~  CT Brain 4/08 showed atrophy & sm vessel dis, no acute changes... ~  CT Head 4/14 showed sm vessel dis & global atrophy, no acute changes... ~  10/15: he remains on Aricept5 & care giver indicates he's doing satis & they don't want to incr the dose...  ANXIETY (ICD-300.00) - on ALPRAZOLAM 0.25mg  Prn...   Past Surgical History  Procedure Laterality Date  . Inguinal hernia repair    . Appendectomy    . Coronary artery bypass graft  1997  . Coronary angioplasty with stent placement      Outpatient Encounter Prescriptions as of 05/25/2014  Medication Sig  . acetaminophen (TYLENOL) 650 MG CR tablet Take 650 mg by mouth every 8 (eight) hours as needed for pain (for pain).  Billy Morgan aspirin 81 MG tablet Take 81 mg by mouth daily.    Billy Morgan donepezil (ARICEPT) 5 MG tablet take 1 tablet by mouth at bedtime  . hydrALAZINE (APRESOLINE) 25 MG tablet Take 1 tablet (25 mg total) by mouth 2 (two) times daily.  . isosorbide mononitrate (IMDUR) 60 MG 24 hr tablet 30 mg every morning and 60 mg every evening  . ketoconazole (NIZORAL) 2 % cream Apply 1 application topically daily. To left side of face, behind ears, and to scalp  . Melatonin 5 MG TABS Take 2 tablets by mouth at bedtime.  . metolazone (ZAROXOLYN) 2.5 MG tablet Take 2.5 mg by mouth as needed.  . metoprolol succinate (TOPROL-XL) 50 MG 24 hr tablet Take 1 tablet (50 mg total) by mouth daily.  . Multiple Vitamin (MULTIVITAMIN WITH MINERALS) TABS Take 2 tablets by mouth daily.  . nitroGLYCERIN (NITROSTAT) 0.4 MG SL tablet Place 0.4 mg under the tongue every 5  (five) minutes as needed. For chest pain  . potassium chloride SA (K-DUR,KLOR-CON) 20 MEQ tablet Take 1 tablet (20 mEq total) by mouth daily.  . rosuvastatin (CRESTOR) 20 MG tablet Take 1 tablet (20 mg total) by mouth daily.  . sertraline (ZOLOFT) 25 MG tablet take 1 tablet by mouth once daily  . torsemide (DEMADEX) 20 MG tablet Take 1 tablet (20 mg total) by mouth daily. May take extra dose (20mg ) up to 3 times per week as needed for weight gain    Allergies  Allergen Reactions  . Sulfamethoxazole     REACTION: unspecified    Current Medications, Allergies, Past Medical History, Past Surgical  History, Family History, and Social History were reviewed in [employer] record.    Review of Systems       See HPI - all other systems neg except as noted...      The patient complains of recent URI/ "cold" 1/13 + malaise, decreased hearing, dyspnea on exertion, constipation, nocturia, joint pain, stiffness, arthritis, and anxiety.  The patient denies fever, chills, sweats, anorexia, fatigue, weakness, weight loss, sleep disorder, blurring, diplopia, eye irritation, eye discharge, vision loss, eye pain, photophobia, earache, ear discharge, tinnitus, nosebleeds, sore throat, chest pain, palpitations, syncope, orthopnea, PND, peripheral edema, dyspnea at rest, excessive sputum, hemoptysis, wheezing, pleurisy, nausea, vomiting, diarrhea, abdominal pain, melena, hematochezia, jaundice, gas/bloating, indigestion/heartburn, dysphagia, odynophagia, dysuria, hematuria, urinary frequency, urinary hesitancy, incontinence, back pain, joint swelling, muscle cramps, muscle weakness, sciatica, restless legs, leg pain at night, leg pain with exertion, rash, itching, dryness, suspicious lesions, paralysis, paresthesias, seizures, tremors, vertigo, transient blindness, frequent falls, frequent headaches, difficulty walking, depression, memory loss, confusion, cold intolerance, heat intolerance,  polydipsia, polyphagia, polyuria, unusual weight change, abnormal bruising, bleeding, enlarged lymph nodes, urticaria, allergic rash, hay fever, and recurrent infections.     Objective:   Physical Exam     WD, WN, 80 y/o WM in NAD... GENERAL:  Alert & oriented; pleasant & cooperative... HEENT:  /AT, EOM-wnl, PERRLA, EACs-clear, TMs-wnl, NOSE-sl congestion, THROAT-clear w/o exud... NECK:  Supple w/ fairROM; no JVD; normal carotid impulses w/o bruits; no thyromegaly or nodules palpated; no lymphadenopathy. CHEST:  Few scat rhonchi & wheezes; no rales or signs of consolidation... HEART:  Regular Rhythm; Gr 1/6 SEM w/o rubs or gallops. ABDOMEN:  Soft, distended, w/ normal bowel sounds; non-tender, no organomegaly or masses detected. EXT: without deformities, mild arthritic changes; no varicose veins/ +venous insuffic/ tr edema. NEURO:  CN's intact;  no focal neuro deficits... DERM:  No lesions noted; no rash etc...  RADIOLOGY DATA:  Reviewed in the EPIC EMR & discussed w/ the patient...  LABORATORY DATA:  Reviewed in the EPIC EMR & discussed w/ the patient...   Assessment & Plan:    HBP>  Controlled on Metop, Apres, Torsemide> continue same except decr Demadex to 20Qod for now due to Cr=2.3 & close f/u w/ DrKelly...  ASHD>  Followed by Billy Morgan & stable on ASA + above rx; neg Myoview & Echo 1/14, rec to continue to stay active etc...  ASPVD>  Aware, no signif claud symptoms & asked to remain as active as poss...  CHOL>  On Cres20 & stable, continue same...  GI>  GERD/  IBS>  Followed by DrKaplan; prev stable w/ Miralax prn...  Prostate Cancer & mild Renal Insuffic>  Very slowly rising PSA s/p radium seeds 2000 & he is followed by Urology but we don't have notes...  DJD>  Aware, he uses Tylenol prn...  Memory Loss/ Depression>  Aware- on Aricept5 & Zoloft25...   Patient's Medications  New Prescriptions   No medications on file  Previous Medications   ACETAMINOPHEN (TYLENOL)  650 MG CR TABLET    Take 650 mg by mouth every 8 (eight) hours as needed for pain (for pain).   ASPIRIN 81 MG TABLET    Take 81 mg by mouth daily.     DONEPEZIL (ARICEPT) 5 MG TABLET    take 1 tablet by mouth at bedtime   HYDRALAZINE (APRESOLINE) 25 MG TABLET    Take 1 tablet (25 mg total) by mouth 2 (two) times daily.   ISOSORBIDE MONONITRATE (IMDUR) 60 MG  24 HR TABLET    30 mg every morning and 60 mg every evening   KETOCONAZOLE (NIZORAL) 2 % CREAM    Apply 1 application topically daily. To left side of face, behind ears, and to scalp   MELATONIN 5 MG TABS    Take 2 tablets by mouth at bedtime.   METOLAZONE (ZAROXOLYN) 2.5 MG TABLET    Take 2.5 mg by mouth as needed.   METOPROLOL SUCCINATE (TOPROL-XL) 50 MG 24 HR TABLET    Take 1 tablet (50 mg total) by mouth daily.   MULTIPLE VITAMIN (MULTIVITAMIN WITH MINERALS) TABS    Take 2 tablets by mouth daily.   NITROGLYCERIN (NITROSTAT) 0.4 MG SL TABLET    Place 0.4 mg under the tongue every 5 (five) minutes as needed. For chest pain   POTASSIUM CHLORIDE SA (K-DUR,KLOR-CON) 20 MEQ TABLET    Take 1 tablet (20 mEq total) by mouth daily.   ROSUVASTATIN (CRESTOR) 20 MG TABLET    Take 1 tablet (20 mg total) by mouth daily.   SERTRALINE (ZOLOFT) 25 MG TABLET    take 1 tablet by mouth once daily   TORSEMIDE (DEMADEX) 20 MG TABLET    Take 1 tablet (20 mg total) by mouth daily. May take extra dose (20mg ) up to 3 times per week as needed for weight gain  Modified Medications   No medications on file  Discontinued Medications   No medications on file

## 2014-05-26 ENCOUNTER — Other Ambulatory Visit: Payer: Self-pay

## 2014-05-26 MED ORDER — NITROGLYCERIN 0.4 MG SL SUBL: 0.4000 mg | SUBLINGUAL_TABLET | SUBLINGUAL | Status: AC | PRN

## 2014-05-26 NOTE — Telephone Encounter (Signed)
Rx sent to pharmacy   

## 2014-05-27 ENCOUNTER — Other Ambulatory Visit: Payer: Self-pay | Admitting: Pulmonary Disease

## 2014-05-27 DIAGNOSIS — I10 Essential (primary) hypertension: Secondary | ICD-10-CM

## 2014-05-27 NOTE — Addendum Note (Signed)
Addended by: Elie Confer on: 05/27/2014 02:20 PM   Modules accepted: Orders

## 2014-06-10 ENCOUNTER — Telehealth: Payer: Self-pay | Admitting: Pulmonary Disease

## 2014-06-10 NOTE — Telephone Encounter (Signed)
lmomtcb x 1  For Billy Morgan.

## 2014-06-10 NOTE — Telephone Encounter (Signed)
Billy Morgan returned call Spoke with Billy Morgan and advised that we do have a BMET in the system for pt.  Billy Morgan voiced her understanding.  Nothing further needed; will sign off.

## 2014-06-11 ENCOUNTER — Other Ambulatory Visit (INDEPENDENT_AMBULATORY_CARE_PROVIDER_SITE_OTHER): Payer: Medicare HMO

## 2014-06-11 ENCOUNTER — Emergency Department (HOSPITAL_COMMUNITY): Payer: Medicare HMO

## 2014-06-11 ENCOUNTER — Encounter (HOSPITAL_COMMUNITY): Payer: Self-pay | Admitting: *Deleted

## 2014-06-11 ENCOUNTER — Emergency Department (HOSPITAL_COMMUNITY)
Admission: EM | Admit: 2014-06-11 | Discharge: 2014-06-11 | Disposition: A | Discharge status: Home / Self Care | Payer: Medicare HMO | Attending: Emergency Medicine | Admitting: Emergency Medicine

## 2014-06-11 DIAGNOSIS — I5032 Chronic diastolic (congestive) heart failure: Secondary | ICD-10-CM | POA: Insufficient documentation

## 2014-06-11 DIAGNOSIS — Z79899 Other long term (current) drug therapy: Secondary | ICD-10-CM | POA: Diagnosis not present

## 2014-06-11 DIAGNOSIS — R7889 Finding of other specified substances, not normally found in blood: Secondary | ICD-10-CM | POA: Diagnosis not present

## 2014-06-11 DIAGNOSIS — R0789 Other chest pain: Secondary | ICD-10-CM | POA: Diagnosis not present

## 2014-06-11 DIAGNOSIS — R7989 Other specified abnormal findings of blood chemistry: Secondary | ICD-10-CM

## 2014-06-11 DIAGNOSIS — H9190 Unspecified hearing loss, unspecified ear: Secondary | ICD-10-CM | POA: Insufficient documentation

## 2014-06-11 DIAGNOSIS — M1990 Unspecified osteoarthritis, unspecified site: Secondary | ICD-10-CM | POA: Insufficient documentation

## 2014-06-11 DIAGNOSIS — Z7982 Long term (current) use of aspirin: Secondary | ICD-10-CM | POA: Diagnosis not present

## 2014-06-11 DIAGNOSIS — I2510 Atherosclerotic heart disease of native coronary artery without angina pectoris: Secondary | ICD-10-CM | POA: Diagnosis not present

## 2014-06-11 DIAGNOSIS — Z8546 Personal history of malignant neoplasm of prostate: Secondary | ICD-10-CM | POA: Insufficient documentation

## 2014-06-11 DIAGNOSIS — R079 Chest pain, unspecified: Secondary | ICD-10-CM | POA: Diagnosis present

## 2014-06-11 DIAGNOSIS — N183 Chronic kidney disease, stage 3 (moderate): Secondary | ICD-10-CM | POA: Insufficient documentation

## 2014-06-11 DIAGNOSIS — Z9861 Coronary angioplasty status: Secondary | ICD-10-CM | POA: Insufficient documentation

## 2014-06-11 DIAGNOSIS — F0390 Unspecified dementia without behavioral disturbance: Secondary | ICD-10-CM | POA: Insufficient documentation

## 2014-06-11 DIAGNOSIS — Z8719 Personal history of other diseases of the digestive system: Secondary | ICD-10-CM | POA: Diagnosis not present

## 2014-06-11 DIAGNOSIS — I129 Hypertensive chronic kidney disease with stage 1 through stage 4 chronic kidney disease, or unspecified chronic kidney disease: Secondary | ICD-10-CM | POA: Insufficient documentation

## 2014-06-11 DIAGNOSIS — Z951 Presence of aortocoronary bypass graft: Secondary | ICD-10-CM | POA: Diagnosis not present

## 2014-06-11 DIAGNOSIS — I10 Essential (primary) hypertension: Secondary | ICD-10-CM

## 2014-06-11 LAB — BASIC METABOLIC PANEL
Anion gap: 13 (ref 5–15)
BUN: 21 mg/dL (ref 6–23)
BUN: 23 mg/dL (ref 6–23)
CHLORIDE: 100 meq/L (ref 96–112)
CHLORIDE: 100 meq/L (ref 96–112)
CO2: 28 mEq/L (ref 19–32)
CO2: 31 meq/L (ref 19–32)
CREATININE: 1.6 mg/dL — AB (ref 0.4–1.5)
Calcium: 8.9 mg/dL (ref 8.4–10.5)
Calcium: 9.5 mg/dL (ref 8.4–10.5)
Creatinine, Ser: 1.76 mg/dL — ABNORMAL HIGH (ref 0.50–1.35)
GFR calc non Af Amer: 31 mL/min — ABNORMAL LOW (ref >90)
GFR, EST AFRICAN AMERICAN: 36 mL/min — AB (ref >90)
GFR: 41.77 mL/min — ABNORMAL LOW (ref >60.00)
Glucose, Bld: 103 mg/dL — ABNORMAL HIGH (ref 70–99)
Glucose, Bld: 95 mg/dL (ref 70–99)
POTASSIUM: 4.2 meq/L (ref 3.5–5.1)
POTASSIUM: 4.5 meq/L (ref 3.7–5.3)
Sodium: 136 mEq/L (ref 135–145)
Sodium: 144 mEq/L (ref 137–147)

## 2014-06-11 LAB — CBC
HCT: 45.2 % (ref 39.0–52.0)
Hemoglobin: 15.1 g/dL (ref 13.0–17.0)
MCH: 32.8 pg (ref 26.0–34.0)
MCHC: 33.4 g/dL (ref 30.0–36.0)
MCV: 98 fL (ref 78.0–100.0)
PLATELETS: 184 10*3/uL (ref 150–400)
RBC: 4.61 MIL/uL (ref 4.22–5.81)
RDW: 13.1 % (ref 11.5–15.5)
WBC: 9.9 10*3/uL (ref 4.0–10.5)

## 2014-06-11 LAB — I-STAT TROPONIN, ED: TROPONIN I, POC: 0.03 ng/mL (ref 0.00–0.08)

## 2014-06-11 LAB — PRO B NATRIURETIC PEPTIDE: PRO B NATRI PEPTIDE: 991.8 pg/mL — AB (ref 0–450)

## 2014-06-11 MED ORDER — TORSEMIDE 20 MG PO TABS
20.0000 mg | ORAL_TABLET | Freq: Every day | ORAL | Status: DC
  Filled 2014-06-11: qty 1

## 2014-06-11 NOTE — ED Notes (Signed)
Per family pt developed right sided chest pain with increased SOB, hx of same, hx of CHF, gained 11 lbs in last week. Pt took 3 nitroglycerins at home without relief.

## 2014-06-11 NOTE — ED Notes (Signed)
Pt reports right side chest pain that started about an hour ago, was sharp, sts hx of CHF. Pt sts took 3 nytros at home with no help. Pt now sts pain is "easing off".

## 2014-06-11 NOTE — ED Notes (Signed)
EKG given to EDP,Lockwood,MD., for review. 

## 2014-06-11 NOTE — Discharge Instructions (Signed)
As discussed, your evaluation today has been largely reassuring.  But, it is important that you monitor your condition carefully, and do not hesitate to return to the ED if you develop new, or concerning changes in your condition.  As discussed, please take the torsemide that you were previously prescribed.  This may affect your kidney function, and this should be discussed with your physician.  Otherwise, please follow-up with your physician for appropriate ongoing care.

## 2014-06-11 NOTE — ED Provider Notes (Signed)
CSN: [id number]     Arrival date & time 06/11/14  1955 History   First MD Initiated Contact with Patient 06/11/14 2040     Chief Complaint  Patient presents with  . Chest Pain  . Shortness of Breath     (Consider location/radiation/quality/duration/timing/severity/associated sxs/prior Treatment) HPI  Patient presents with chest pain.  Level V caveat secondary to dementia.  Patient has history of present illness provided by family members. He states that earlier tonight, approximately one hour ago, while at rest the patient described anterior chest pain.  Pain improved with nitroglycerin. Family states the patient has gained approximately 10 pounds in the past 2 weeks. Notably, the patient also recently stopped his diuretics due to concerns for persistent kidney disease.  The patient himself denies pain, dyspnea, smiling.   Past Medical History  Diagnosis Date  . Unspecified hearing loss   . HTN (hypertension)   . CAD (coronary artery disease)     a. 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 b. Low risk Myoview 1/14.  Marland Kitchen Peripheral vascular disease, unspecified   . Venous insufficiency   . Other and unspecified hyperlipidemia   . Esophageal reflux   . Irritable bowel syndrome   . Osteoarthrosis, unspecified whether generalized or localized, unspecified site   . Anxiety state, unspecified   . Malignant neoplasm of prostate   . Dementia   . Anasarca 09/09/2012  . Hx of echocardiogram 2013    EF 55%-65% with grade 1 diatolic dyfunction.  Marland Kitchen History of stress test 07/2012    normal perfusion without scar or ischemia.  . CKD (chronic kidney disease), stage III     a. Hx of more acute RF in 2013 - Cr 2.75. b. Baseline ~1.6.  . Chronic diastolic CHF (congestive heart failure)   . Prostate cancer   . Chronic edema    Past Surgical History  Procedure Laterality Date  . Inguinal hernia repair    . Appendectomy    . Coronary artery bypass graft  1997  . Coronary angioplasty with stent placement      Family History  Problem Relation Age of Onset  . Hypertension Mother   . Heart disease Father   . Colon cancer Neg Hx    History  Substance Use Topics  . Smoking status: Never Smoker   . Smokeless tobacco: Never Used  . Alcohol Use: No    Review of Systems  Unable to perform ROS: Dementia      Allergies  Sulfamethoxazole  Home Medications   Prior to Admission medications   Medication Sig Start Date End Date Taking? Authorizing Provider  acetaminophen (TYLENOL) 325 MG tablet Take 650 mg by mouth every 6 (six) hours as needed for moderate pain (knee pain).   Yes Historical Provider, MD  acetaminophen (TYLENOL) 650 MG CR tablet Take 650 mg by mouth every 8 (eight) hours as needed for pain (for pain).   Yes Historical Provider, MD  aspirin 81 MG tablet Take 81 mg by mouth daily.     Yes Historical Provider, MD  calcium carbonate (TUMS - DOSED IN MG ELEMENTAL CALCIUM) 500 MG chewable tablet Chew 2 tablets by mouth daily as needed for indigestion or heartburn (indigestion).   Yes Historical Provider, MD  donepezil (ARICEPT) 5 MG tablet take 1 tablet by mouth at bedtime 05/25/14  Yes Noralee Space, MD  FIBER SELECT GUMMIES PO Take 1 tablet by mouth every other day.   Yes Historical Provider, MD  hydrALAZINE (APRESOLINE) 25 MG tablet Take  1 tablet (25 mg total) by mouth 2 (two) times daily. Patient taking differently: Take 25 mg by mouth 3 (three) times daily as needed (blood pressure).  04/10/14  Yes Troy Sine, MD  isosorbide mononitrate (IMDUR) 60 MG 24 hr tablet 30 mg every morning and 60 mg every evening 05/06/14  Yes Troy Sine, MD  ketoconazole (NIZORAL) 2 % cream Apply 1 application topically daily. To left side of face, behind ears, and to scalp   Yes Historical Provider, MD  Melatonin 5 MG TABS Take 2 tablets by mouth at bedtime.   Yes Historical Provider, MD  metolazone (ZAROXOLYN) 2.5 MG tablet Take 2.5 mg by mouth daily as needed (weight gain).  10/03/13  Yes  Brittainy Erie Noe, PA-C  metoprolol succinate (TOPROL-XL) 50 MG 24 hr tablet Take 1 tablet (50 mg total) by mouth daily. 12/11/13  Yes Troy Sine, MD  Multiple Vitamin (MULTIVITAMIN WITH MINERALS) TABS Take 2 tablets by mouth daily.   Yes Historical Provider, MD  nitroGLYCERIN (NITROSTAT) 0.4 MG SL tablet Place 1 tablet (0.4 mg total) under the tongue every 5 (five) minutes as needed. For chest pain 05/26/14  Yes Troy Sine, MD  potassium chloride SA (K-DUR,KLOR-CON) 20 MEQ tablet Take 1 tablet (20 mEq total) by mouth daily. 02/25/14  Yes Troy Sine, MD  rosuvastatin (CRESTOR) 20 MG tablet Take 1 tablet (20 mg total) by mouth daily. 05/06/14  Yes Troy Sine, MD  sertraline (ZOLOFT) 25 MG tablet take 1 tablet by mouth once daily 05/25/14  Yes Noralee Space, MD  torsemide (DEMADEX) 20 MG tablet Take 20 mg by mouth every other day. May take extra dose (20mg ) up to 3 times per week as needed for weight gain 03/27/14  Yes Troy Sine, MD   BP 154/67 mmHg  Pulse 63  Temp(Src) 97.7 F (36.5 C) (Oral)  Resp 21  SpO2 99% Physical Exam  Constitutional: He is oriented to person, place, and time. He appears well-developed. No distress.  HENT:  Head: Normocephalic and atraumatic.  Eyes: Conjunctivae and EOM are normal.  Cardiovascular: Normal rate and regular rhythm.   Pulmonary/Chest: Effort normal. No stridor. No respiratory distress.  Abdominal: He exhibits no distension.  Musculoskeletal: He exhibits no edema.  Neurological: He is alert and oriented to person, place, and time.  Skin: Skin is warm and dry.  Psychiatric: He is slowed and withdrawn. Cognition and memory are impaired.  Nursing note and vitals reviewed.   ED Course  Procedures (including critical care time) Labs Review Labs Reviewed  BASIC METABOLIC PANEL - Abnormal; Notable for the following:    Creatinine, Ser 1.76 (*)    GFR calc non Af Amer 31 (*)    GFR calc Af Amer 36 (*)    All other components within  normal limits  PRO B NATRIURETIC PEPTIDE - Abnormal; Notable for the following:    Pro B Natriuretic peptide (BNP) 991.8 (*)    All other components within normal limits  CBC  I-STAT TROPOININ, ED    Imaging Review Dg Chest Port 1 View  06/11/2014   CLINICAL DATA:  Chest pain and shortness of breath 45 min.  EXAM: PORTABLE CHEST - 1 VIEW  COMPARISON:  03/18/2014 and 09/09/2012.  FINDINGS: Lungs are adequately inflated with mild prominence of the perihilar/infrahilar markings suggesting vascular congestion. No definite consolidation or effusion. Pleural calcification along the left hemidiaphragm. Cardiomediastinal silhouette is within normal. There is calcified plaque over the thoracic  aorta. Remainder of the exam is unchanged.  IMPRESSION: Findings suggesting mild vascular congestion.  Asbestos related pleural disease.   Electronically Signed   By: Marin Olp M.D.   On: 06/11/2014 20:54    ECG: Sr, 61, twa, artefact, abnormal  O2- 99%ra, nml  Patient's BNP is elevated.  Consistent with stopping torsemide.  On repeat exam the patient appears calm, no ongoing complaints.  MDM   Patient presents with chest pain.  Patient is awake and alert, in no distress, with no pain.  Patient has multiple medical problems, including dementia, CAD, chronic kidney disease.  Patient's evaluation demonstrates fluid retention, no evidence of ischemic changes, nor pneumonia. Patient had low dose diuretics re-started, after lengthy conversation with the family about risks / benefits and his Hx. He will f/u w PMD.     Carmin Muskrat, MD 06/11/14 [phone]

## 2014-06-15 ENCOUNTER — Other Ambulatory Visit: Payer: Self-pay

## 2014-06-15 MED ORDER — TORSEMIDE 20 MG PO TABS: 20.0000 mg | ORAL_TABLET | ORAL | Status: DC

## 2014-06-30 ENCOUNTER — Encounter: Payer: Self-pay | Admitting: *Deleted

## 2014-06-30 ENCOUNTER — Ambulatory Visit (INDEPENDENT_AMBULATORY_CARE_PROVIDER_SITE_OTHER): Payer: Medicare HMO | Admitting: Cardiovascular Disease

## 2014-06-30 VITALS — BP 120/68 | HR 80 | Ht 72.0 in | Wt 191.0 lb

## 2014-06-30 DIAGNOSIS — I5032 Chronic diastolic (congestive) heart failure: Secondary | ICD-10-CM

## 2014-06-30 DIAGNOSIS — I5033 Acute on chronic diastolic (congestive) heart failure: Secondary | ICD-10-CM

## 2014-06-30 MED ORDER — TORSEMIDE 20 MG PO TABS: 60.0000 mg | ORAL_TABLET | Freq: Every day | ORAL | Status: DC

## 2014-06-30 NOTE — Assessment & Plan Note (Signed)
Reubin presents today in transfer form Dr. Claiborne Billings. He has chronic diastolic congestive heart failure complicated by moderate to severe renal insufficiency.  These issues are further complicated by that he has fairly significant dementia. He was seen today with his daughter- Gar Ponto.  He has full-time care caretakers.  He is clearly volume overloaded today. He's been taking Demadex but has not been diuresing much. We'll have him start by taking Demadex 60 mg a day starting tomorrow. His caretaker will call us in the next several days to report whether or not he is having significant diuresis. I'll plan on seeing him again in 4-6 weeks for followup visit. He may need to see a nurse practitioner or a 31 assistant and I do not have available time.  He's not having any significant discomfort.  I anticipate that his renal function may deteriorate with more aggressive diuresis. At this point I don't think that we have much choice but to continue with aggressive diuresis since he clearly is volume overloaded.

## 2014-06-30 NOTE — Progress Notes (Signed)
Billy Morgan, [address] [place], [place]  [address] Phone: [phone] Fax:  [phone]  Date:  06/30/2014   Patient ID:  Billy Morgan May 22, 1918, MRN [id number]   PCP:  Noralee Space, MD  Cardiologist: Dr. Acie Fredrickson  Problem list: 1. Alzheimer's dementia 2. Chronic diastolic congestive heart failure 3. Coronary artery disease-status post coronary artery bypass grafting in 1997 4.  chronic kidney disease-stage III  History of Present Illness: Billy Morgan is a 80 y.o. male with history of Alzheimer's dementia, chronic diastolic CHF, CAD s/p CABG 1997 with low risk nuc 07/2012, dementia,  CKD stage III (baseline report 1.6) who presents to clinic today for followup. Last echo 06/2012: mild LVH, EF 55-60%, no RWMA, grade 1 diastolic dysfunction.      Marland Kitchen He is brought in by his home caregiver who appears to be managing his diuretics very well on an outpatient basis. Dry weight 184-185.  He is 185 today and gleefully denies any known symptoms. His caregiver reports that he has chronic SOB and intermittent orthopnea,   No chest pain, syncope, LEE. He is taking torsemide 20mg  daily with extra 1 tablet PRN up to 3x/week for extra fluid gain.   Compliant with salt restriction. Has not had to use metolazone recently.  Dec. 1, 2015:  Was een today with his daughter Billy Morgan)  Dry weight is 184 - he is up to 191 .  He's not been taking his hydralazine because of hypotension.   Hhe does not take isosorbide if his blood pressure too lowe takes he has a sore but as needed but will hold it  Recent Labs: 10/17/2013: HDL Cholesterol by NMR 33*; LDL (calc) 66 03/18/2014: TSH 1.494 05/25/2014: ALT 16 06/11/2014: Creatinine 1.76*; Hemoglobin 15.1; Potassium 4.5; Pro B Natriuretic peptide (BNP) 991.8*  Wt Readings from Last 3 Encounters:  06/30/14 191 lb (86.637 kg)  05/25/14 188 lb (85.276 kg)  04/13/14 188 lb 9.6 oz (85.548 kg)     Past Medical History  Diagnosis Date  .  Unspecified hearing loss   . HTN (hypertension)   . CAD (coronary artery disease)     a. 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 b. Low risk Myoview 1/14.  Marland Kitchen Peripheral vascular disease, unspecified   . Venous insufficiency   . Other and unspecified hyperlipidemia   . Esophageal reflux   . Irritable bowel syndrome   . Osteoarthrosis, unspecified whether generalized or localized, unspecified site   . Anxiety state, unspecified   . Malignant neoplasm of prostate   . Dementia   . Anasarca 09/09/2012  . Hx of echocardiogram 2013    EF 55%-65% with grade 1 diatolic dyfunction.  Marland Kitchen History of stress test 07/2012    normal perfusion without scar or ischemia.  . CKD (chronic kidney disease), stage III     a. Hx of more acute RF in 2013 - Cr 2.75. b. Baseline ~1.6.  . Chronic diastolic CHF (congestive heart failure)   . Prostate cancer   . Chronic edema     Current Outpatient Prescriptions  Medication Sig Dispense Refill  . acetaminophen (TYLENOL) 325 MG tablet Take 650 mg by mouth every 6 (six) hours as needed for moderate pain (knee pain).    Marland Kitchen aspirin 81 MG tablet Take 81 mg by mouth daily.      . calcium carbonate (TUMS - DOSED IN MG ELEMENTAL CALCIUM) 500 MG chewable tablet Chew 2 tablets by mouth daily as needed for indigestion or  heartburn (indigestion).    Marland Kitchen donepezil (ARICEPT) 5 MG tablet take 1 tablet by mouth at bedtime 90 tablet 3  . FIBER SELECT GUMMIES PO Take 1 tablet by mouth daily.     . hydrALAZINE (APRESOLINE) 25 MG tablet Take 1 tablet (25 mg total) by mouth 2 (two) times daily. (Patient taking differently: Take 25 mg by mouth 3 (three) times daily as needed (blood pressure). ) 90 tablet 2  . isosorbide mononitrate (IMDUR) 60 MG 24 hr tablet 30 mg every morning and 60 mg every evening (Patient taking differently: Take by mouth as needed. 30 mg every morning and 60 mg every evening) 45 tablet 10  . ketoconazole (NIZORAL) 2 % cream Apply 1 application topically daily. To left side of face, behind ears,  and to scalp    . Melatonin 5 MG TABS Take 2 tablets by mouth at bedtime.    . metolazone (ZAROXOLYN) 2.5 MG tablet Take 2.5 mg by mouth daily as needed (weight gain).     . metoprolol succinate (TOPROL-XL) 50 MG 24 hr tablet Take 1 tablet (50 mg total) by mouth daily. (Patient taking differently: Take 50 mg by mouth as needed. ) 30 tablet 6  . Multiple Vitamin (MULTIVITAMIN WITH MINERALS) TABS Take 2 tablets by mouth daily.    . nitroGLYCERIN (NITROSTAT) 0.4 MG SL tablet Place 1 tablet (0.4 mg total) under the tongue every 5 (five) minutes as needed. For chest pain 180 tablet 1  . potassium chloride SA (K-DUR,KLOR-CON) 20 MEQ tablet Take 1 tablet (20 mEq total) by mouth daily. 30 tablet 6  . rosuvastatin (CRESTOR) 20 MG tablet Take 1 tablet (20 mg total) by mouth daily. 30 tablet 11  . sertraline (ZOLOFT) 25 MG tablet take 1 tablet by mouth once daily 90 tablet 3  . torsemide (DEMADEX) 20 MG tablet Take 1 tablet (20 mg total) by mouth every other day. May take extra dose (20mg ) up to 3 times per week as needed for weight gain 45 tablet 2   No current facility-administered medications for this visit.    Allergies:   Sulfamethoxazole   Social History:  The patient  reports that he has never smoked. He has never used smokeless tobacco. He reports that he does not drink alcohol or use illicit drugs.   Family History:  The patient's family history includes Cancer in his mother; Heart disease in his father; Hypertension in his mother. There is no history of Colon cancer, Heart attack, or Stroke.   ROS:  Please see the history of present illness.  All other systems reviewed and negative.   PHYSICAL EXAM: VS:  BP 120/68 mmHg  Pulse 80  Ht 6' (1.829 m)  Wt 191 lb (86.637 kg)  BMI 25.90 kg/m2 Well nourished, well developed, in no acute distress HEENT: normal Neck: no JVD Cardiac:  normal S1, S2; RRR; no murmur Lungs:  Bilateral rales in the bases.  Abd: smild - moderate distension,   tense Ext: 2+ edema up to his thighs.  Skin: warm and dry Neuro:  moves all extremities spontaneously, hard of hearing with hearing aids in Pleasantly demented.  Answers questions - frequently needed help   ASSESSMENT AND PLAN:

## 2014-06-30 NOTE — Patient Instructions (Signed)
Your physician has recommended you make the following change in your medication:  INCREASE Demadex to 60 mg once daily  Your physician recommends that you have lab work:  [facility] recommends that you schedule a follow-up appointment in: 4 weeks with Dr. Acie Fredrickson or one of our PA/NPs Your physician recommends that you return for lab work in: same day as follow-up visit (BMET)

## 2014-07-01 ENCOUNTER — Telehealth: Payer: Self-pay | Admitting: Nurse Practitioner

## 2014-07-01 DIAGNOSIS — I5032 Chronic diastolic (congestive) heart failure: Secondary | ICD-10-CM

## 2014-07-01 LAB — BASIC METABOLIC PANEL
BUN: 38 mg/dL — ABNORMAL HIGH (ref 6–23)
CALCIUM: 9 mg/dL (ref 8.4–10.5)
CO2: 32 mEq/L (ref 19–32)
CREATININE: 1.6 mg/dL — AB (ref 0.4–1.5)
Chloride: 100 mEq/L (ref 96–112)
GFR: 43.93 mL/min — AB (ref >60.00)
Glucose, Bld: 96 mg/dL (ref 70–99)
Potassium: 4 mEq/L (ref 3.5–5.1)
SODIUM: 138 meq/L (ref 135–145)

## 2014-07-01 NOTE — Telephone Encounter (Signed)
Left message on patient's son's voice mail to call office.  I am calling to check on patient's urinary output since increasing Demadex dosage yesterday

## 2014-07-01 NOTE — Telephone Encounter (Signed)
Received call from patient's caregiver, Gilmore Laroche, who accompanied patient at office visit yesterday. She report patient's weight is 188 lb today.  She states he has slept most of the morning and just got out of bed at 1:00 pm and is complaining of headache.  She advised that patient has not had significant urine output today that she is aware of; states prior to adjustment of Demadex by Dr. Acie Fredrickson yesterday, patient was taking Demadex 40 mg one day and 60 mg the next.  I reviewed patient's lab results from yesterday with her and advised patient may be dehydrated as BUN is elevated but Creatinine was actually improved from 2 weeks ago.  Gilmore Laroche verbalized understanding and agreement and states she will encourage patient to drink more water.  She states patient is sleeping more and seems to have a general decline in overall health over the last few weeks.  I advised her to call back tomorrow with questions or concerns.  I am routing message to Dr. Acie Fredrickson for his advice.

## 2014-07-02 MED ORDER — TORSEMIDE 20 MG PO TABS: 100.0000 mg | ORAL_TABLET | Freq: Every day | ORAL | Status: AC

## 2014-07-02 NOTE — Telephone Encounter (Signed)
This is a difficult situation. He has significant renal insufficiency and also has heart failure. Lets try demedex 100 mg a day and see if he diureses with that. If not, we may have to resort to pre-treating with metalazone.

## 2014-07-02 NOTE — Telephone Encounter (Signed)
Spoke with patient's son, Elenore Rota, who verbalized understanding of Dr. Elmarie Shiley instruction to increase Demadex to 100 mg daily.  Elenore Rota asked me to contact patient's caregiver, Gilmore Laroche. I called and spoke with Gilmore Laroche who verbalized understanding to increase Demadex to 100 mg daily and to notify Dr. Acie Fredrickson of patient's weight tomorrow.  Gilmore Laroche verbalized understanding and agreement that Dr. Acie Fredrickson is looking for a decrease in weight and increased urinary output.

## 2014-07-07 ENCOUNTER — Telehealth: Payer: Self-pay | Admitting: Nurse Practitioner

## 2014-07-07 NOTE — Telephone Encounter (Signed)
i agree with the notes below by Christen Bame, RN.

## 2014-07-07 NOTE — Telephone Encounter (Signed)
Spoke with patient's caregiver, Billy Morgan, who states patient fell a few days ago which he believed was caused by the increased Demadex making him feel "wobbly."   [name] states she has decreased the amount of Demadex back to patient's previous dose of 40 mg one day and 60 mg the next.  [name] states patient has not taken any medication for BP control and BP is 099 to 96 systolic and 74 to 83'J diastolic.  She reports the patient continues to have dyspnea and that weight is holding steady at 188 lb.  She states that the increased dose of Demadex did not decrease the patient's weight at all.  She states patient continues to have swelling in his knee and leg and she believes he has had difficulty with this since previous stenting of leg.  [name] states that patient's overall health is declining but that she does believe patient needs regular monitoring with cardiology.  I scheduled patient for follow-up office visit and BMET on 1/7 and advised her to call back if patient needs sooner appointment or if she has questions or concerns.  [name] verbalized understanding and agreement.

## 2014-07-20 ENCOUNTER — Telehealth: Payer: Self-pay | Admitting: Cardiovascular Disease

## 2014-07-20 NOTE — Telephone Encounter (Signed)
Per Gilmore Laroche - pt very SOB just sitting and eating.  Wt is up 5 lbs and he has abdominal distension.  02 Sat is 95%.  Pt has been taking Demadex 20 mg two tablets a day.  He will take metolazone for the next 2 days and continue daily wts.  They will call back if no improvement in s/s.

## 2014-07-20 NOTE — Telephone Encounter (Signed)
New Msg     Gilmore Laroche McMasters calling from [facility] has questions about pt that she would like to discuss with the nurse. Details not given.     Please contact her at [phone]

## 2014-08-06 ENCOUNTER — Ambulatory Visit: Payer: Medicare HMO | Admitting: Cardiovascular Disease

## 2014-08-25 ENCOUNTER — Ambulatory Visit (INDEPENDENT_AMBULATORY_CARE_PROVIDER_SITE_OTHER): Payer: Medicare HMO | Admitting: Physician Assistant

## 2014-08-25 ENCOUNTER — Inpatient Hospital Stay (HOSPITAL_COMMUNITY)
Admission: AD | Admit: 2014-08-25 | Discharge: 2014-08-27 | DRG: 189 | Disposition: A | Discharge status: Home Health | Payer: Medicare HMO | Source: Ambulatory Visit | Attending: Cardiovascular Disease | Admitting: Cardiovascular Disease

## 2014-08-25 ENCOUNTER — Telehealth: Payer: Self-pay | Admitting: Cardiovascular Disease

## 2014-08-25 ENCOUNTER — Encounter: Payer: Self-pay | Admitting: Physician Assistant

## 2014-08-25 ENCOUNTER — Encounter (HOSPITAL_COMMUNITY): Payer: Self-pay | Admitting: *Deleted

## 2014-08-25 ENCOUNTER — Inpatient Hospital Stay (HOSPITAL_COMMUNITY): Payer: Medicare HMO

## 2014-08-25 ENCOUNTER — Telehealth: Payer: Self-pay | Admitting: Pulmonary Disease

## 2014-08-25 VITALS — BP 90/60 | HR 82 | Ht 72.0 in | Wt 180.0 lb

## 2014-08-25 DIAGNOSIS — I5033 Acute on chronic diastolic (congestive) heart failure: Secondary | ICD-10-CM

## 2014-08-25 DIAGNOSIS — R630 Anorexia: Secondary | ICD-10-CM | POA: Diagnosis present

## 2014-08-25 DIAGNOSIS — Z79899 Other long term (current) drug therapy: Secondary | ICD-10-CM

## 2014-08-25 DIAGNOSIS — J9601 Acute respiratory failure with hypoxia: Secondary | ICD-10-CM | POA: Diagnosis present

## 2014-08-25 DIAGNOSIS — N183 Chronic kidney disease, stage 3 unspecified: Secondary | ICD-10-CM | POA: Diagnosis present

## 2014-08-25 DIAGNOSIS — Z7982 Long term (current) use of aspirin: Secondary | ICD-10-CM

## 2014-08-25 DIAGNOSIS — G309 Alzheimer's disease, unspecified: Secondary | ICD-10-CM | POA: Diagnosis present

## 2014-08-25 DIAGNOSIS — R609 Edema, unspecified: Secondary | ICD-10-CM

## 2014-08-25 DIAGNOSIS — I5042 Chronic combined systolic (congestive) and diastolic (congestive) heart failure: Secondary | ICD-10-CM

## 2014-08-25 DIAGNOSIS — R627 Adult failure to thrive: Secondary | ICD-10-CM | POA: Diagnosis present

## 2014-08-25 DIAGNOSIS — I5032 Chronic diastolic (congestive) heart failure: Secondary | ICD-10-CM

## 2014-08-25 DIAGNOSIS — F0390 Unspecified dementia without behavioral disturbance: Secondary | ICD-10-CM

## 2014-08-25 DIAGNOSIS — I959 Hypotension, unspecified: Secondary | ICD-10-CM | POA: Diagnosis present

## 2014-08-25 DIAGNOSIS — I739 Peripheral vascular disease, unspecified: Secondary | ICD-10-CM | POA: Diagnosis present

## 2014-08-25 DIAGNOSIS — R0600 Dyspnea, unspecified: Secondary | ICD-10-CM

## 2014-08-25 DIAGNOSIS — R0609 Other forms of dyspnea: Secondary | ICD-10-CM

## 2014-08-25 DIAGNOSIS — I10 Essential (primary) hypertension: Secondary | ICD-10-CM | POA: Diagnosis present

## 2014-08-25 DIAGNOSIS — Z8249 Family history of ischemic heart disease and other diseases of the circulatory system: Secondary | ICD-10-CM

## 2014-08-25 DIAGNOSIS — K589 Irritable bowel syndrome without diarrhea: Secondary | ICD-10-CM | POA: Diagnosis present

## 2014-08-25 DIAGNOSIS — E785 Hyperlipidemia, unspecified: Secondary | ICD-10-CM | POA: Diagnosis present

## 2014-08-25 DIAGNOSIS — Z951 Presence of aortocoronary bypass graft: Secondary | ICD-10-CM

## 2014-08-25 DIAGNOSIS — I2510 Atherosclerotic heart disease of native coronary artery without angina pectoris: Secondary | ICD-10-CM | POA: Diagnosis present

## 2014-08-25 DIAGNOSIS — H9190 Unspecified hearing loss, unspecified ear: Secondary | ICD-10-CM | POA: Diagnosis present

## 2014-08-25 DIAGNOSIS — F411 Generalized anxiety disorder: Secondary | ICD-10-CM | POA: Diagnosis present

## 2014-08-25 DIAGNOSIS — F0280 Dementia in other diseases classified elsewhere without behavioral disturbance: Secondary | ICD-10-CM | POA: Diagnosis present

## 2014-08-25 DIAGNOSIS — R413 Other amnesia: Secondary | ICD-10-CM | POA: Diagnosis present

## 2014-08-25 DIAGNOSIS — K219 Gastro-esophageal reflux disease without esophagitis: Secondary | ICD-10-CM | POA: Diagnosis present

## 2014-08-25 DIAGNOSIS — Z882 Allergy status to sulfonamides status: Secondary | ICD-10-CM

## 2014-08-25 DIAGNOSIS — C61 Malignant neoplasm of prostate: Secondary | ICD-10-CM | POA: Diagnosis present

## 2014-08-25 DIAGNOSIS — R54 Age-related physical debility: Secondary | ICD-10-CM | POA: Diagnosis present

## 2014-08-25 DIAGNOSIS — I129 Hypertensive chronic kidney disease with stage 1 through stage 4 chronic kidney disease, or unspecified chronic kidney disease: Secondary | ICD-10-CM | POA: Diagnosis present

## 2014-08-25 DIAGNOSIS — R0902 Hypoxemia: Secondary | ICD-10-CM

## 2014-08-25 DIAGNOSIS — I509 Heart failure, unspecified: Secondary | ICD-10-CM | POA: Insufficient documentation

## 2014-08-25 DIAGNOSIS — R0602 Shortness of breath: Secondary | ICD-10-CM | POA: Diagnosis present

## 2014-08-25 LAB — APTT: aPTT: 32 seconds (ref 24–37)

## 2014-08-25 LAB — BLOOD GAS, ARTERIAL
ACID-BASE EXCESS: 7.5 mmol/L — AB (ref 0.0–2.0)
Bicarbonate: 31.5 mEq/L — ABNORMAL HIGH (ref 20.0–24.0)
DRAWN BY: 31996
O2 Content: 2 L/min
O2 SAT: 97.2 %
Patient temperature: 98.6
TCO2: 32.8 mmol/L (ref 0–100)
pCO2 arterial: 44.5 mmHg (ref 35.0–45.0)
pH, Arterial: 7.464 — ABNORMAL HIGH (ref 7.350–7.450)
pO2, Arterial: 94 mmHg (ref 80.0–100.0)

## 2014-08-25 LAB — CBC WITH DIFFERENTIAL/PLATELET
Basophils Absolute: 0 10*3/uL (ref 0.0–0.1)
Basophils Relative: 0 % (ref 0–1)
Eosinophils Absolute: 0 10*3/uL (ref 0.0–0.7)
Eosinophils Relative: 0 % (ref 0–5)
HCT: 45.7 % (ref 39.0–52.0)
Hemoglobin: 15.4 g/dL (ref 13.0–17.0)
Lymphocytes Relative: 8 % — ABNORMAL LOW (ref 12–46)
Lymphs Abs: 0.9 10*3/uL (ref 0.7–4.0)
MCH: 31.8 pg (ref 26.0–34.0)
MCHC: 33.7 g/dL (ref 30.0–36.0)
MCV: 94.4 fL (ref 78.0–100.0)
Monocytes Absolute: 0.6 10*3/uL (ref 0.1–1.0)
Monocytes Relative: 5 % (ref 3–12)
Neutro Abs: 10.3 10*3/uL — ABNORMAL HIGH (ref 1.7–7.7)
Neutrophils Relative %: 87 % — ABNORMAL HIGH (ref 43–77)
PLATELETS: 238 10*3/uL (ref 150–400)
RBC: 4.84 MIL/uL (ref 4.22–5.81)
RDW: 13 % (ref 11.5–15.5)
WBC: 11.9 10*3/uL — ABNORMAL HIGH (ref 4.0–10.5)

## 2014-08-25 LAB — COMPREHENSIVE METABOLIC PANEL
ALK PHOS: 70 U/L (ref 39–117)
ALT: 21 U/L (ref 0–53)
AST: 36 U/L (ref 0–37)
Albumin: 3.7 g/dL (ref 3.5–5.2)
Anion gap: 14 (ref 5–15)
BUN: 23 mg/dL (ref 6–23)
CALCIUM: 9.5 mg/dL (ref 8.4–10.5)
CHLORIDE: 92 mmol/L — AB (ref 96–112)
CO2: 32 mmol/L (ref 19–32)
CREATININE: 2.12 mg/dL — AB (ref 0.50–1.35)
GFR calc Af Amer: 29 mL/min — ABNORMAL LOW (ref >90)
GFR calc non Af Amer: 25 mL/min — ABNORMAL LOW (ref >90)
Glucose, Bld: 152 mg/dL — ABNORMAL HIGH (ref 70–99)
POTASSIUM: 3.9 mmol/L (ref 3.5–5.1)
SODIUM: 138 mmol/L (ref 135–145)
Total Bilirubin: 0.8 mg/dL (ref 0.3–1.2)
Total Protein: 7.6 g/dL (ref 6.0–8.3)

## 2014-08-25 LAB — TROPONIN I
Troponin I: 0.1 ng/mL — ABNORMAL HIGH (ref <0.031)
Troponin I: 0.12 ng/mL — ABNORMAL HIGH (ref <0.031)

## 2014-08-25 LAB — TSH: TSH: 1.325 u[IU]/mL (ref 0.350–4.500)

## 2014-08-25 LAB — PROTIME-INR
INR: 1.16 (ref 0.00–1.49)
Prothrombin Time: 14.9 seconds (ref 11.6–15.2)

## 2014-08-25 LAB — BRAIN NATRIURETIC PEPTIDE: B NATRIURETIC PEPTIDE 5: 32.1 pg/mL (ref 0.0–100.0)

## 2014-08-25 MED ORDER — FIBER SELECT GUMMIES PO CHEW: CHEWABLE_TABLET | Freq: Every day | ORAL | Status: DC

## 2014-08-25 MED ORDER — ISOSORBIDE MONONITRATE ER 30 MG PO TB24
30.0000 mg | ORAL_TABLET | Freq: Every day | ORAL | Status: DC
  Administered 2014-08-26 – 2014-08-27 (×2): 30 mg via ORAL
  Filled 2014-08-25 (×3): qty 1

## 2014-08-25 MED ORDER — ONDANSETRON HCL 4 MG/2ML IJ SOLN
4.0000 mg | Freq: Four times a day (QID) | INTRAMUSCULAR | Status: DC | PRN
Start: 2014-08-25 — End: 2014-08-27

## 2014-08-25 MED ORDER — ADULT MULTIVITAMIN W/MINERALS CH
1.0000 | ORAL_TABLET | Freq: Every day | ORAL | Status: DC
  Administered 2014-08-26 – 2014-08-27 (×2): 1 via ORAL
  Filled 2014-08-25 (×2): qty 1

## 2014-08-25 MED ORDER — ACETAMINOPHEN 325 MG PO TABS: 650.0000 mg | ORAL_TABLET | Freq: Four times a day (QID) | ORAL | Status: DC | PRN

## 2014-08-25 MED ORDER — ALBUTEROL SULFATE (2.5 MG/3ML) 0.083% IN NEBU: 2.5000 mg | INHALATION_SOLUTION | Freq: Four times a day (QID) | RESPIRATORY_TRACT | Status: DC

## 2014-08-25 MED ORDER — SODIUM CHLORIDE 0.9 % IJ SOLN
3.0000 mL | Freq: Two times a day (BID) | INTRAMUSCULAR | Status: DC
  Administered 2014-08-25 – 2014-08-27 (×4): 3 mL via INTRAVENOUS

## 2014-08-25 MED ORDER — ASPIRIN 81 MG PO CHEW
81.0000 mg | CHEWABLE_TABLET | Freq: Every day | ORAL | Status: DC
  Administered 2014-08-26 – 2014-08-27 (×2): 81 mg via ORAL
  Filled 2014-08-25 (×2): qty 1

## 2014-08-25 MED ORDER — MELATONIN 5 MG PO TABS: 2.0000 | ORAL_TABLET | Freq: Every day | ORAL | Status: DC

## 2014-08-25 MED ORDER — TORSEMIDE 100 MG PO TABS
100.0000 mg | ORAL_TABLET | Freq: Every day | ORAL | Status: DC
  Filled 2014-08-25 (×2): qty 1

## 2014-08-25 MED ORDER — CALCIUM POLYCARBOPHIL 625 MG PO TABS
625.0000 mg | ORAL_TABLET | Freq: Every day | ORAL | Status: DC
  Administered 2014-08-27: 625 mg via ORAL
  Filled 2014-08-25 (×2): qty 1

## 2014-08-25 MED ORDER — DONEPEZIL HCL 5 MG PO TABS
5.0000 mg | ORAL_TABLET | Freq: Every day | ORAL | Status: DC
  Administered 2014-08-26: 5 mg via ORAL
  Filled 2014-08-25 (×4): qty 1

## 2014-08-25 MED ORDER — ADULT MULTIVITAMIN W/MINERALS CH: 2.0000 | ORAL_TABLET | Freq: Every day | ORAL | Status: DC

## 2014-08-25 MED ORDER — METOPROLOL SUCCINATE ER 25 MG PO TB24
25.0000 mg | ORAL_TABLET | Freq: Every day | ORAL | Status: DC
  Administered 2014-08-26: 25 mg via ORAL
  Filled 2014-08-25 (×3): qty 1

## 2014-08-25 MED ORDER — SODIUM CHLORIDE 0.9 % IV SOLN: 250.0000 mL | INTRAVENOUS | Status: DC | PRN

## 2014-08-25 MED ORDER — POTASSIUM CHLORIDE CRYS ER 20 MEQ PO TBCR
20.0000 meq | EXTENDED_RELEASE_TABLET | Freq: Every day | ORAL | Status: DC
  Administered 2014-08-26 – 2014-08-27 (×2): 20 meq via ORAL
  Filled 2014-08-25 (×4): qty 1

## 2014-08-25 MED ORDER — ENOXAPARIN SODIUM 30 MG/0.3ML ~~LOC~~ SOLN
30.0000 mg | Freq: Every day | SUBCUTANEOUS | Status: DC
  Administered 2014-08-26 – 2014-08-27 (×2): 30 mg via SUBCUTANEOUS
  Filled 2014-08-25 (×2): qty 0.3

## 2014-08-25 MED ORDER — ROSUVASTATIN CALCIUM 20 MG PO TABS
20.0000 mg | ORAL_TABLET | Freq: Every day | ORAL | Status: DC
  Administered 2014-08-26 – 2014-08-27 (×2): 20 mg via ORAL
  Filled 2014-08-25 (×2): qty 1

## 2014-08-25 MED ORDER — CALCIUM CARBONATE ANTACID 500 MG PO CHEW
2.0000 | CHEWABLE_TABLET | Freq: Every day | ORAL | Status: DC | PRN
  Filled 2014-08-25: qty 2

## 2014-08-25 MED ORDER — SODIUM CHLORIDE 0.9 % IJ SOLN: 3.0000 mL | INTRAMUSCULAR | Status: DC | PRN

## 2014-08-25 MED ORDER — ALBUTEROL SULFATE (2.5 MG/3ML) 0.083% IN NEBU
2.5000 mg | INHALATION_SOLUTION | Freq: Four times a day (QID) | RESPIRATORY_TRACT | Status: DC
  Administered 2014-08-25 – 2014-08-26 (×4): 2.5 mg via RESPIRATORY_TRACT
  Filled 2014-08-25 (×4): qty 3

## 2014-08-25 MED ORDER — SERTRALINE HCL 25 MG PO TABS
25.0000 mg | ORAL_TABLET | Freq: Every day | ORAL | Status: DC
  Administered 2014-08-25 – 2014-08-27 (×3): 25 mg via ORAL
  Filled 2014-08-25 (×3): qty 1

## 2014-08-25 MED ORDER — FUROSEMIDE 10 MG/ML IJ SOLN
80.0000 mg | Freq: Once | INTRAMUSCULAR | Status: AC
  Administered 2014-08-25: 80 mg via INTRAVENOUS
  Filled 2014-08-25: qty 8

## 2014-08-25 MED ORDER — NITROGLYCERIN 0.4 MG SL SUBL: 0.4000 mg | SUBLINGUAL_TABLET | SUBLINGUAL | Status: DC | PRN

## 2014-08-25 MED ORDER — KETOCONAZOLE 2 % EX CREA
1.0000 "application " | TOPICAL_CREAM | Freq: Every day | CUTANEOUS | Status: DC
  Administered 2014-08-26: 1 via TOPICAL
  Filled 2014-08-25: qty 15

## 2014-08-25 NOTE — H&P (Signed)
History and Physical   Date:  08/25/2014   ID:  Billy Morgan, DOB 1918/04/09, MRN [id number]  PCP:  Noralee Space, MD  Cardiologist:  Dr. Liam Rogers     Chief Complaint  Patient presents with  . Shortness of Breath  . Leg Swelling  . Congestive Heart Failure    Diastolic     History of Present Illness: Billy Morgan is a 81 y.o. male who presents for evaluation of the above.   He has a history of diastolic CHF, CKD, prostate CA, Alzheimer's dementia, CAD status post CABG in 1997.  Baseline weight in the past has been 184-185 pounds.  Previously followed by Dr. Claiborne Billings. He established with Dr. Acie Fredrickson in 06/2014. Torsemide dose was increased.  His caregiver called in today with increasing dyspnea and edema. He was added on for further evaluation.  He is here today with his caretaker Larena Glassman) who is a Marine scientist.  She has been taking care of him for over 4 years.  She helps with the hx (hard of hearing, dementia).  She tells me that his appetite has been poor for about 1 month.  He has lost a lot of weight.  His new baseline weight is probably 178 lbs.  It was up to 186 lbs yesterday.  She gave him Metolazone 2.5 mg and his weight is down 6 lbs today.  His edema is improved.  However, his dyspnea is unchanged.  He has been more short of breath for the last 4 days.  He is noticeably short of breath sitting still in the office.  He denies orthopnea.  There has been noted PND.  He denies chest pain.  Denies syncope.  He has chronic abdominal distention.  This was increased, but is now back to baseline with the dose of Metolazone yesterday.  BP is 90/60 and O2 is 87% on RA when he entered the office today.  O2 did go up to 97% on 2LPM.   Studies/Reports Reviewed Today:  - Echocardiogram (10/29/13):  EF 55-60%, normal wall motion, grade 1 diastolic dysfunction, trivial AI  - Nuclear Stress Test (08/02/12):  No ischemia, EF 67%, normal study   Past Medical History  Diagnosis Date  . Unspecified  hearing loss   . HTN (hypertension)   . CAD (coronary artery disease)     a. 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 b. Low risk Myoview 1/14.  Marland Kitchen Peripheral vascular disease, unspecified   . Venous insufficiency   . Other and unspecified hyperlipidemia   . Esophageal reflux   . Irritable bowel syndrome   . Osteoarthrosis, unspecified whether generalized or localized, unspecified site   . Anxiety state, unspecified   . Malignant neoplasm of prostate   . Dementia   . Anasarca 09/09/2012  . Hx of echocardiogram 2013    EF 55%-65% with grade 1 diatolic dyfunction.  Marland Kitchen History of stress test 07/2012    normal perfusion without scar or ischemia.  . CKD (chronic kidney disease), stage III     a. Hx of more acute RF in 2013 - Cr 2.75. b. Baseline ~1.6.  . Chronic diastolic CHF (congestive heart failure)   . Prostate cancer   . Chronic edema     Past Surgical History  Procedure Laterality Date  . Inguinal hernia repair    . Appendectomy    . Coronary artery bypass graft  1997    CABG x5  . Cardiac catheterization  07/01/1996    progressive CAD; preserved global LV  function; coronary calcification involving teh LAD and RCA with progressive stnosis.   . Cardiac catheterization  07/19/1992    nl LV function; single vessel CAD involving the LAD with 40% proximal stenosis, 70% focal mid stenosis and 50% first diagonal stenosis     Current Outpatient Prescriptions  Medication Sig Dispense Refill  . acetaminophen (TYLENOL) 325 MG tablet Take 650 mg by mouth every 6 (six) hours as needed for moderate pain (knee pain).    Marland Kitchen aspirin 81 MG tablet Take 81 mg by mouth daily.      . calcium carbonate (TUMS - DOSED IN MG ELEMENTAL CALCIUM) 500 MG chewable tablet Chew 2 tablets by mouth daily as needed for indigestion or heartburn (indigestion).    Marland Kitchen donepezil (ARICEPT) 5 MG tablet take 1 tablet by mouth at bedtime 90 tablet 3  . FIBER SELECT GUMMIES PO Take 1 tablet by mouth daily.     . isosorbide mononitrate (IMDUR) 60 MG  24 hr tablet 30 mg every morning and 60 mg every evening (Patient taking differently: Take 60 mg by mouth as needed (heartrate). 30 mg every morning and 60 mg every evening) 45 tablet 10  . ketoconazole (NIZORAL) 2 % cream Apply 1 application topically daily. To left side of face, behind ears, and to scalp    . Melatonin 5 MG TABS Take 2 tablets by mouth at bedtime.    . metolazone (ZAROXOLYN) 2.5 MG tablet Take 2.5 mg by mouth daily as needed (weight gain).     . metoprolol succinate (TOPROL-XL) 50 MG 24 hr tablet Take 1 tablet (50 mg total) by mouth daily. (Patient taking differently: Take 50 mg by mouth as needed. ) 30 tablet 6  . Multiple Vitamin (MULTIVITAMIN WITH MINERALS) TABS Take 2 tablets by mouth daily.    . nitroGLYCERIN (NITROSTAT) 0.4 MG SL tablet Place 1 tablet (0.4 mg total) under the tongue every 5 (five) minutes as needed. For chest pain 180 tablet 1  . potassium chloride SA (K-DUR,KLOR-CON) 20 MEQ tablet Take 1 tablet (20 mEq total) by mouth daily. 30 tablet 6  . rosuvastatin (CRESTOR) 20 MG tablet Take 1 tablet (20 mg total) by mouth daily. 30 tablet 11  . sertraline (ZOLOFT) 25 MG tablet take 1 tablet by mouth once daily 90 tablet 3  . torsemide (DEMADEX) 20 MG tablet Take 5 tablets (100 mg total) by mouth daily. 270 tablet 3   No current facility-administered medications for this visit.    Allergies:   Sulfamethoxazole    Social History:  The patient  reports that he has never smoked. He has never used smokeless tobacco. He reports that he does not drink alcohol or use illicit drugs.   Family History:  The patient's family history includes Cancer in his mother; Heart disease in his father; Hypertension in his mother. There is no history of Colon cancer, Heart attack, or Stroke.    ROS:  Please see the history of present illness.   Otherwise, review of systems are positive for weakness, balance issues (he fell over the weekend and injured his R knee), non-productive cough,  R leg pain, HAs.   All other systems are reviewed and negative.    PHYSICAL EXAM: VS:  BP 90/60 mmHg  Pulse 82  Ht 6' (1.829 m)  Wt 180 lb (81.647 kg)  BMI 24.41 kg/m2  SpO2 87%    Wt Readings from Last 3 Encounters:  08/25/14 180 lb (81.647 kg)  06/30/14 191 lb (86.637 kg)  05/25/14 188 lb (85.276 kg)     GEN: chronically ill appearing male noticeably short of breath sitting still HEENT: normal Neck: no JVD at 90 degrees, no masses Cardiac:  Normal S1/S2, RRR; no murmur, no rubs or gallops, trace edema  Respiratory:  Decreased breath sounds bilaterally, faint anterior rhonchi, rales at the bases bilaterally (worse on R) GI: + distended, + BS MS: no deformity or atrophy Skin: warm and dry  Neuro:  CNs II-XII intact  Psych: Normal affect   EKG:  EKG is ordered today.  It demonstrates:   NSR, HR 82, normal axis, NSSTTW changes, no change from prior tracing.    Recent Labs: 03/18/2014: TSH 1.494 05/25/2014: ALT 16 06/11/2014: Hemoglobin 15.1; Platelets 184; Pro B Natriuretic peptide (BNP) 991.8* 06/30/2014: BUN 38*; Creatinine 1.6*; Potassium 4.0; Sodium 138    Lipid Panel    Component Value Date/Time   CHOL 139 10/17/2013 1335   TRIG 198* 10/17/2013 1335   HDL 33* 10/17/2013 1335   CHOLHDL 4.2 10/17/2013 1335   VLDL 40 10/17/2013 1335   LDLCALC 66 10/17/2013 1335   LDLDIRECT 176.3 04/01/2009 0849      ASSESSMENT AND PLAN:  1.  Dyspnea:  Patient presents to the office today essentially with hypoxic respiratory failure.  It is not clear that this is entirely related to volume excess. He may have pneumonia.  Lung exam is abnormal.  He did have some visible chills while interviewing him.  No reported fevers.  I have recommended admission to the hospital for further evaluation and management.  I reviewed with Dr. Liam Rogers who also saw the patient and agreed.    -  Admit to [facility] floor.    -  Will give a dose of Lasix 80 mg IV upon arrival.      -  Will  keep him on Torsemide 100 mg daily otherwise.  We can decide in the AM whether to continue IV diuresis or oral diuresis based upon response tonight.      -  Continue O2 at 2LPM    -  Add Albuterol Nebs every 6 hours.    -  Obtain CXR, ABGs and CBC with diff.  He may need antibiotics but will wait to see if he has signs of infection.    -  Will ask IM to consult as well. 2.  Acute on Chronic Diastolic CHF:  Diurese as noted above.  If he has a good response to IV Lasix tonight, consider changing oral Torsemide to IV Lasix for several days.    -  CXR, BMET, BNP    -  Monitor renal function closely. 3.  Coronary Artery Disease:  No clear angina.      -  Continue ASA, nitrates, beta blocker, statin.    -  Check serial enzymes to r/o ischemia as a cause for increased dyspnea.    -  Decrease Toprol-XL 50 mg >> 25 mg and Imdur 30 mg A/60 mg P >> 30 mg daily given hypotension.  4.  Chronic Kidney Disease:  Monitor renal function closely with diuresis. 5.  Full Code:  Patient requests CPR, defibrillation, intubation, etc should he decompensate.  If he does not respond to therapy and continues to slowly decline, may need to consult palliative care.    Current medicines are reviewed at length with the patient today.  The patient does not have concerns regarding medicines.  The following changes have been made:  As above.  Labs/  tests ordered today include:  Orders Placed This Encounter  Procedures  . EKG 12-Lead    Disposition:   FU with Dr. Liam Rogers after DC.    Signed, Versie Starks, MHS 08/25/2014 2:43 PM    [facility] [place], [place], [place]  [address] Phone: [phone]; Fax: [phone]    Attending Note:   The patient was seen and examined.  Agree with assessment and plan as noted above.  Changes made to the above note as needed.  Mr. Kistler has had failure to thrive .  He has been gradually going down hill for the past several weeks.  +  cough - though non productive No fevers  He was give zaroxolyn yesterday and put out a significant amount of ure but is not feeling any better.  He has had nausea and reduced appetite.  He needs to be admitted, IV lasix. Will need to watch renal function closely - his symptoms are c/w renal insufficiency.    Thayer Headings, Brooke Bonito., MD, [facility] 08/25/2014, 5:46 PM [address]  [place] Pager [phone]

## 2014-08-25 NOTE — Telephone Encounter (Signed)
Spoke with pt's caregiver Gilmore Laroche, states that pt has had increased wheezing and sob X1 week.  Also has had worsening nonprod cough.  Denies fever, congestion.  Has been given tussin CF X1 week to maintain cough.  Gilmore Laroche states that pt has grayish color to his face.  Pt states he feels fine but pt also has dementia.  Gilmore Laroche is requesting recs.  If any meds are sent they are to go to the [facility] on [address].  Last ov: 05/26/15 Next ov: none  Dr. Lenna Gilford please advise.  Thanks!  Allergies  Allergen Reactions  . Sulfamethoxazole     REACTION: unspecified   Current Outpatient Prescriptions on File Prior to Visit  Medication Sig Dispense Refill  . acetaminophen (TYLENOL) 325 MG tablet Take 650 mg by mouth every 6 (six) hours as needed for moderate pain (knee pain).    Marland Kitchen aspirin 81 MG tablet Take 81 mg by mouth daily.      . calcium carbonate (TUMS - DOSED IN MG ELEMENTAL CALCIUM) 500 MG chewable tablet Chew 2 tablets by mouth daily as needed for indigestion or heartburn (indigestion).    Marland Kitchen donepezil (ARICEPT) 5 MG tablet take 1 tablet by mouth at bedtime 90 tablet 3  . FIBER SELECT GUMMIES PO Take 1 tablet by mouth daily.     . isosorbide mononitrate (IMDUR) 60 MG 24 hr tablet 30 mg every morning and 60 mg every evening (Patient taking differently: Take by mouth as needed. 30 mg every morning and 60 mg every evening) 45 tablet 10  . ketoconazole (NIZORAL) 2 % cream Apply 1 application topically daily. To left side of face, behind ears, and to scalp    . Melatonin 5 MG TABS Take 2 tablets by mouth at bedtime.    . metolazone (ZAROXOLYN) 2.5 MG tablet Take 2.5 mg by mouth daily as needed (weight gain).     . metoprolol succinate (TOPROL-XL) 50 MG 24 hr tablet Take 1 tablet (50 mg total) by mouth daily. (Patient taking differently: Take 50 mg by mouth as needed. ) 30 tablet 6  . Multiple Vitamin (MULTIVITAMIN WITH MINERALS) TABS Take 2 tablets by mouth daily.    . nitroGLYCERIN (NITROSTAT) 0.4 MG SL  tablet Place 1 tablet (0.4 mg total) under the tongue every 5 (five) minutes as needed. For chest pain 180 tablet 1  . potassium chloride SA (K-DUR,KLOR-CON) 20 MEQ tablet Take 1 tablet (20 mEq total) by mouth daily. 30 tablet 6  . rosuvastatin (CRESTOR) 20 MG tablet Take 1 tablet (20 mg total) by mouth daily. 30 tablet 11  . sertraline (ZOLOFT) 25 MG tablet take 1 tablet by mouth once daily 90 tablet 3  . torsemide (DEMADEX) 20 MG tablet Take 5 tablets (100 mg total) by mouth daily. 270 tablet 3   No current facility-administered medications on file prior to visit.

## 2014-08-25 NOTE — Telephone Encounter (Signed)
Per SN:  Pt needs to see Dr Acie Fredrickson (Cardiology).  Pt seen N[facility]ser on 06/30/14 and was told to f/u in 4-6 weeks and pt has not f/u yet.  Dr Acie Fredrickson changed some of his meds that need to be addressed. Spoke with Gilmore Laroche and advised of Dr Jeannine Kitten recommendations.  She asked for the phone number for Dr Acie Fredrickson.  Phone number was given and she states she will call to make appt.

## 2014-08-25 NOTE — Telephone Encounter (Signed)
Pt's care giver called rearding pt having SOB , and fluid build up for the last 4 days. Care giver states that pt's baseline wt is no longer 184 because he had lost a lot of weigh. Yesterday pt was 186 lbs, swollen feet. Pt took the Torsemide 20 mg  The swollen is better.. Today pt waights 180 lbs and still having SOB, and fluid build up in his abdomen. Pt has an appointment to be seen today at 2:00 PM with scott weaver PA. Pt's care giver is aware of appointment.

## 2014-08-25 NOTE — Telephone Encounter (Signed)
Pt c/o Shortness Of Breath: STAT if SOB developed within the last 24 hours or pt is noticeably SOB on the phone  1. Are you currently SOB (can you hear that pt is SOB on the phone)? SOB while at rest (pt is not on the phone his nurse is)  2. How long have you been experiencing SOB? For about 4 days  3. Are you SOB when sitting or when up moving around? Both 4.  Are you currently experiencing any other symptoms? Fluid build up  Pt's Nurse [facility] called reports that the pt has experienced SOB and Weezing for the last 4 days base line is no longer 184. Pt fell on sunday balance is very bad because his knee is swelling. Will need to OT for comfort measures. She believes that he is having SOB because of fluid build up. She states that she spoke with Dr. Brendolyn Patty office which is his PCP and was advised to have the pt seen by Dr. Acie Fredrickson ASAP. Please call

## 2014-08-25 NOTE — Progress Notes (Signed)
Cardiology Office Note   Date:  08/25/2014   ID:  Billy Morgan, DOB 01/01/1918, MRN [id number]  PCP:  Noralee Space, MD  Cardiologist:  Dr. Liam Rogers     Chief Complaint  Patient presents with  . Shortness of Breath  . Leg Swelling  . Congestive Heart Failure    Diastolic     History of Present Illness: Billy Morgan is a 81 y.o. male who presents for evaluation of the above.   He has a history of diastolic CHF, CKD, prostate CA, Alzheimer's dementia, CAD status post CABG in 1997.  Baseline weight in the past has been 184-185 pounds.  Previously followed by Dr. Claiborne Billings. He established with Dr. Acie Fredrickson in 06/2014. Torsemide dose was increased.  His caregiver called in today with increasing dyspnea and edema. He was added on for further evaluation.  He is here today with his caretaker Larena Glassman) who is a Marine scientist.  She has been taking care of him for over 4 years.  She helps with the hx (hard of hearing, dementia).  She tells me that his appetite has been poor for about 1 month.  He has lost a lot of weight.  His new baseline weight is probably 178 lbs.  It was up to 186 lbs yesterday.  She gave him Metolazone 2.5 mg and his weight is down 6 lbs today.  His edema is improved.  However, his dyspnea is unchanged.  He has been more short of breath for the last 4 days.  He is noticeably short of breath sitting still in the office.  He denies orthopnea.  There has been noted PND.  He denies chest pain.  Denies syncope.  He has chronic abdominal distention.  This was increased, but is now back to baseline with the dose of Metolazone yesterday.  BP is 90/60 and O2 is 87% on RA when he entered the office today.  O2 did go up to 97% on 2LPM.   Studies/Reports Reviewed Today:  - Echocardiogram (10/29/13):  EF 55-60%, normal wall motion, grade 1 diastolic dysfunction, trivial AI  - Nuclear Stress Test (08/02/12):  No ischemia, EF 67%, normal study   Past Medical History  Diagnosis Date  .  Unspecified hearing loss   . HTN (hypertension)   . CAD (coronary artery disease)     a. 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 b. Low risk Myoview 1/14.  Marland Kitchen Peripheral vascular disease, unspecified   . Venous insufficiency   . Other and unspecified hyperlipidemia   . Esophageal reflux   . Irritable bowel syndrome   . Osteoarthrosis, unspecified whether generalized or localized, unspecified site   . Anxiety state, unspecified   . Malignant neoplasm of prostate   . Dementia   . Anasarca 09/09/2012  . Hx of echocardiogram 2013    EF 55%-65% with grade 1 diatolic dyfunction.  Marland Kitchen History of stress test 07/2012    normal perfusion without scar or ischemia.  . CKD (chronic kidney disease), stage III     a. Hx of more acute RF in 2013 - Cr 2.75. b. Baseline ~1.6.  . Chronic diastolic CHF (congestive heart failure)   . Prostate cancer   . Chronic edema     Past Surgical History  Procedure Laterality Date  . Inguinal hernia repair    . Appendectomy    . Coronary artery bypass graft  1997    CABG x5  . Cardiac catheterization  07/01/1996    progressive CAD; preserved global LV  function; coronary calcification involving teh LAD and RCA with progressive stnosis.   . Cardiac catheterization  07/19/1992    nl LV function; single vessel CAD involving the LAD with 40% proximal stenosis, 70% focal mid stenosis and 50% first diagonal stenosis     Current Outpatient Prescriptions  Medication Sig Dispense Refill  . acetaminophen (TYLENOL) 325 MG tablet Take 650 mg by mouth every 6 (six) hours as needed for moderate pain (knee pain).    Marland Kitchen aspirin 81 MG tablet Take 81 mg by mouth daily.      . calcium carbonate (TUMS - DOSED IN MG ELEMENTAL CALCIUM) 500 MG chewable tablet Chew 2 tablets by mouth daily as needed for indigestion or heartburn (indigestion).    Marland Kitchen donepezil (ARICEPT) 5 MG tablet take 1 tablet by mouth at bedtime 90 tablet 3  . FIBER SELECT GUMMIES PO Take 1 tablet by mouth daily.     . isosorbide mononitrate  (IMDUR) 60 MG 24 hr tablet 30 mg every morning and 60 mg every evening (Patient taking differently: Take 60 mg by mouth as needed (heartrate). 30 mg every morning and 60 mg every evening) 45 tablet 10  . ketoconazole (NIZORAL) 2 % cream Apply 1 application topically daily. To left side of face, behind ears, and to scalp    . Melatonin 5 MG TABS Take 2 tablets by mouth at bedtime.    . metolazone (ZAROXOLYN) 2.5 MG tablet Take 2.5 mg by mouth daily as needed (weight gain).     . metoprolol succinate (TOPROL-XL) 50 MG 24 hr tablet Take 1 tablet (50 mg total) by mouth daily. (Patient taking differently: Take 50 mg by mouth as needed. ) 30 tablet 6  . Multiple Vitamin (MULTIVITAMIN WITH MINERALS) TABS Take 2 tablets by mouth daily.    . nitroGLYCERIN (NITROSTAT) 0.4 MG SL tablet Place 1 tablet (0.4 mg total) under the tongue every 5 (five) minutes as needed. For chest pain 180 tablet 1  . potassium chloride SA (K-DUR,KLOR-CON) 20 MEQ tablet Take 1 tablet (20 mEq total) by mouth daily. 30 tablet 6  . rosuvastatin (CRESTOR) 20 MG tablet Take 1 tablet (20 mg total) by mouth daily. 30 tablet 11  . sertraline (ZOLOFT) 25 MG tablet take 1 tablet by mouth once daily 90 tablet 3  . torsemide (DEMADEX) 20 MG tablet Take 5 tablets (100 mg total) by mouth daily. 270 tablet 3   No current facility-administered medications for this visit.    Allergies:   Sulfamethoxazole    Social History:  The patient  reports that he has never smoked. He has never used smokeless tobacco. He reports that he does not drink alcohol or use illicit drugs.   Family History:  The patient's family history includes Cancer in his mother; Heart disease in his father; Hypertension in his mother. There is no history of Colon cancer, Heart attack, or Stroke.    ROS:  Please see the history of present illness.   Otherwise, review of systems are positive for weakness, balance issues (he fell over the weekend and injured his R knee),  non-productive cough, R leg pain, HAs.   All other systems are reviewed and negative.    PHYSICAL EXAM: VS:  BP 90/60 mmHg  Pulse 82  Ht 6' (1.829 m)  Wt 180 lb (81.647 kg)  BMI 24.41 kg/m2  SpO2 87%    Wt Readings from Last 3 Encounters:  08/25/14 180 lb (81.647 kg)  06/30/14 191 lb (86.637 kg)  05/25/14 188 lb (85.276 kg)     GEN: chronically ill appearing male noticeably short of breath sitting still HEENT: normal Neck: no JVD at 90 degrees, no masses Cardiac:  Normal S1/S2, RRR; no murmur, no rubs or gallops, trace edema  Respiratory:  Decreased breath sounds bilaterally, faint anterior rhonchi, rales at the bases bilaterally (worse on R) GI: + distended, + BS MS: no deformity or atrophy Skin: warm and dry  Neuro:  CNs II-XII intact  Psych: Normal affect   EKG:  EKG is ordered today.  It demonstrates:   NSR, HR 82, normal axis, NSSTTW changes, no change from prior tracing.    Recent Labs: 03/18/2014: TSH 1.494 05/25/2014: ALT 16 06/11/2014: Hemoglobin 15.1; Platelets 184; Pro B Natriuretic peptide (BNP) 991.8* 06/30/2014: BUN 38*; Creatinine 1.6*; Potassium 4.0; Sodium 138   CrCl cannot be calculated (Patient has no serum creatinine result on file.).   Lipid Panel    Component Value Date/Time   CHOL 139 10/17/2013 1335   TRIG 198* 10/17/2013 1335   HDL 33* 10/17/2013 1335   CHOLHDL 4.2 10/17/2013 1335   VLDL 40 10/17/2013 1335   LDLCALC 66 10/17/2013 1335   LDLDIRECT 176.3 04/01/2009 0849      ASSESSMENT AND PLAN:  1.  Dyspnea:  Patient presents to the office today essentially with hypoxic respiratory failure.  It is not clear that this is entirely related to volume excess. He may have pneumonia.  Lung exam is abnormal.  He did have some visible chills while interviewing him.  No reported fevers.  I have recommended admission to the hospital for further evaluation and management.  I reviewed with Dr. Liam Rogers who also saw the patient and agreed.    -   Admit to [facility] Tele floor.    -  Will give a dose of Lasix 80 mg IV upon arrival.      -  Will keep him on Torsemide 100 mg daily otherwise.  We can decide in the AM whether to continue IV diuresis or oral diuresis based upon response tonight.      -  Continue O2 at 2LPM    -  Add Albuterol Nebs every 6 hours.    -  Obtain CXR, ABGs and CBC with diff.  He may need antibiotics but will wait to see if he has signs of infection.    -  Will ask IM to consult as well. 2.  Acute on Chronic Diastolic CHF:  Diurese as noted above.  If he has a good response to IV Lasix tonight, consider changing oral Torsemide to IV Lasix for several days.    -  CXR, BMET, BNP    -  Monitor renal function closely. 3.  Coronary Artery Disease:  No clear angina.      -  Continue ASA, nitrates, beta blocker, statin.    -  Check serial enzymes to r/o ischemia as a cause for increased dyspnea.    -  Decrease Toprol-XL 50 mg >> 25 mg and Imdur 30 mg A/60 mg P >> 30 mg daily given hypotension.  4.  Chronic Kidney Disease:  Monitor renal function closely with diuresis. 5.  Full Code:  Patient requests CPR, defibrillation, intubation, etc should he decompensate.  If he does not respond to therapy and continues to slowly decline, may need to consult palliative care.    Current medicines are reviewed at length with the patient today.  The patient does not have concerns regarding  medicines.  The following changes have been made:  As above.  Labs/ tests ordered today include:  Orders Placed This Encounter  Procedures  . EKG 12-Lead    Disposition:   FU with Dr. Liam Rogers after DC.    Signed, Versie Starks, MHS 08/25/2014 2:43 PM    [facility] [address], [place], [place]  [address] Phone: [phone]; Fax: [phone]

## 2014-08-25 NOTE — Patient Instructions (Addendum)
YOU HAVE BEEN ADMITTED TO [facility] TO UNIT 3 EAST

## 2014-08-26 DIAGNOSIS — N183 Chronic kidney disease, stage 3 (moderate): Secondary | ICD-10-CM

## 2014-08-26 DIAGNOSIS — I5033 Acute on chronic diastolic (congestive) heart failure: Secondary | ICD-10-CM

## 2014-08-26 LAB — BASIC METABOLIC PANEL
Anion gap: 13 (ref 5–15)
BUN: 24 mg/dL — ABNORMAL HIGH (ref 6–23)
CALCIUM: 9.6 mg/dL (ref 8.4–10.5)
CO2: 35 mmol/L — ABNORMAL HIGH (ref 19–32)
Chloride: 91 mmol/L — ABNORMAL LOW (ref 96–112)
Creatinine, Ser: 2.16 mg/dL — ABNORMAL HIGH (ref 0.50–1.35)
GFR calc Af Amer: 28 mL/min — ABNORMAL LOW (ref >90)
GFR, EST NON AFRICAN AMERICAN: 25 mL/min — AB (ref >90)
Glucose, Bld: 123 mg/dL — ABNORMAL HIGH (ref 70–99)
Potassium: 3.4 mmol/L — ABNORMAL LOW (ref 3.5–5.1)
Sodium: 139 mmol/L (ref 135–145)

## 2014-08-26 LAB — TROPONIN I: Troponin I: 0.16 ng/mL — ABNORMAL HIGH (ref <0.031)

## 2014-08-26 MED ORDER — TORSEMIDE 20 MG PO TABS
60.0000 mg | ORAL_TABLET | Freq: Every day | ORAL | Status: DC
  Administered 2014-08-26 – 2014-08-27 (×2): 60 mg via ORAL
  Filled 2014-08-26 (×2): qty 3

## 2014-08-26 MED ORDER — ALBUTEROL SULFATE (2.5 MG/3ML) 0.083% IN NEBU
2.5000 mg | INHALATION_SOLUTION | Freq: Four times a day (QID) | RESPIRATORY_TRACT | Status: DC
  Administered 2014-08-27 (×2): 2.5 mg via RESPIRATORY_TRACT
  Filled 2014-08-26 (×3): qty 3

## 2014-08-26 NOTE — Progress Notes (Signed)
Patient Name: Billy Morgan Date of Encounter: 08/26/2014  Cardiologist: Dr. Liam Rogers    Principal Problem:   Acute respiratory failure with hypoxia Active Problems:   CAD, CABG X 5 '97. Low risk Myoview 1/14   Chronic renal disease, stage III, his SCr was up to 2.75 in November 2013 and diuretics held   Acute on chronic diastolic CHF (congestive heart failure)   CHF (congestive heart failure)    SUBJECTIVE  Denies any CP, continue to have mild SOB mainly with exertion  CURRENT MEDS . albuterol  2.5 mg Nebulization Q6H  . aspirin  81 mg Oral Daily  . donepezil  5 mg Oral QHS  . enoxaparin (LOVENOX) injection  30 mg Subcutaneous Daily  . isosorbide mononitrate  30 mg Oral Daily  . ketoconazole  1 application Topical Daily  . metoprolol succinate  25 mg Oral Daily  . multivitamin with minerals  1 tablet Oral Daily  . polycarbophil  625 mg Oral Daily  . potassium chloride SA  20 mEq Oral Daily  . rosuvastatin  20 mg Oral Daily  . sertraline  25 mg Oral Daily  . sodium chloride  3 mL Intravenous Q12H  . torsemide  100 mg Oral Daily    OBJECTIVE  Filed Vitals:   08/26/14 0125 08/26/14 0621 08/26/14 0850 08/26/14 1015  BP: 138/66 125/71  121/59  Pulse: 89 90  89  Temp: 97.9 F (36.6 C) 98 F (36.7 C)    TempSrc: Oral Oral    Resp: 20 18    Height:      Weight:  174 lb (78.926 kg)    SpO2: 99% 98% 99%     Intake/Output Summary (Last 24 hours) at 08/26/14 1041 Last data filed at 08/26/14 1000  Gross per 24 hour  Intake    220 ml  Output    400 ml  Net   -180 ml   Filed Weights   08/25/14 1616 08/26/14 0621  Weight: 214 lb 11.7 oz (97.4 kg) 174 lb (78.926 kg)    PHYSICAL EXAM  General: Pleasant, NAD. Neuro: Alert and oriented X 3. Moves all extremities spontaneously. Psych: Normal affect. HEENT:  Normal  Neck: Supple without bruits or JVD. Lungs:  Resp regular and unlabored, CTA, mild rale in L base, atelectasis? Heart: RRR no s3, s4, or  murmurs. Abdomen: Soft, non-tender, non-distended, BS + x 4.  Extremities: No clubbing, cyanosis or edema. DP/PT/Radials 2+ and equal bilaterally.  Accessory Clinical Findings  CBC  Recent Labs  08/25/14 1726  WBC 11.9*  NEUTROABS 10.3*  HGB 15.4  HCT 45.7  MCV 94.4  PLT 732   Basic Metabolic Panel  Recent Labs  08/25/14 1726 08/26/14 0400  NA 138 139  K 3.9 3.4*  CL 92* 91*  CO2 32 35*  GLUCOSE 152* 123*  BUN 23 24*  CREATININE 2.12* 2.16*  CALCIUM 9.5 9.6   Liver Function Tests  Recent Labs  08/25/14 1726  AST 36  ALT 21  ALKPHOS 70  BILITOT 0.8  PROT 7.6  ALBUMIN 3.7   Cardiac Enzymes  Recent Labs  08/25/14 1726 08/25/14 2208 08/26/14 0400  TROPONINI 0.10* 0.12* 0.16*   Thyroid Function Tests  Recent Labs  08/25/14 1726  TSH 1.325    TELE NSR with HR 80s    ECG  No new EKG  Echocardiogram 10/29/2013  LV EF: 55% -  60%  ------------------------------------------------------------ Indications:   CHF - 428.0.  ------------------------------------------------------------ History:  PMH:  Coronary artery disease. Congestive heart failure. Risk factors: Prostate cancer. Anxiety. Hearing loss. PVD. Venous insufficiency. GERD. Dementia. IBS. DJD. CKD stage III. Anasarca. Depression. Hypertension. Dyslipidemia.  ------------------------------------------------------------ Study Conclusions  - Left ventricle: The cavity size was normal. Wall thickness was normal. Systolic function was normal. The estimated ejection fraction was in the range of 55% to 60%. Wall motion was normal; there were no regional wall motion abnormalities. Doppler parameters are consistent with abnormal left ventricular relaxation (grade 1 diastolic dysfunction). - Aortic valve: Trivial regurgitation. Impressions:  - Compared to 07/04/12, no significant change.    Radiology/Studies  Dg Chest 2 View  08/25/2014   CLINICAL DATA:   Shortness of breath.  CHF.  Hypertension.  Hypoxia.  EXAM: CHEST  2 VIEW  COMPARISON:  06/11/2014  FINDINGS: Postoperative changes in the mediastinum. Shallow inspiration with atelectasis in the lung bases. Normal heart size and pulmonary vascularity. Calcified pleural plaques suggesting prior asbestos exposure. No focal airspace disease or consolidation in the lungs. No blunting of costophrenic angles. No pneumothorax. Degenerative changes in the spine and shoulders. No significant change since prior study.  IMPRESSION: Shallow inspiration with atelectasis in the lung bases. Calcified pleural plaques. No acute changes since prior study.   Electronically Signed   By: Lucienne Capers M.D.   On: 08/25/2014 22:41    ASSESSMENT AND PLAN  1. Acute hypoxia  - previous baseline weight 184-185 lbs, per his caretaker, due to poor PO intake, his new baseline is around 178 lbs. Weight was 186 before caretaker gave him 1 dose of metolazone, weight down 6lbs. Edema improved, however continue to have hypoxia.   - did not put out significant amount urine based on I/O, however weight down to 174 lbs.   - discussed with his caretaker, apparently he has been receiving torsemide based on his daily weight at home. Per his caretaker, he usually only get 60mg  per day, and receive additional 20-40mg  as needed based on his weight. I will decreased torsemide to 60mg  daily for now as he appears to be euvolemic.   - patient appears to be very frail, and recent decline in health likely caused significant deconditioning, given his drop in O2 sat in the office, he will likely qualify for home O2  - there was also some initial concern of possible PNA prior to admission which can cause dyspnea, however although WBC borderline high, no fever over night and no productive cough. Occasional dry cough may be related to HF. Will hold off on abx for now until discuss with MD. Hopefully discharge in 24 hrs if no further workup.  2. Acute on  chronic diastolic HF  - Echo EF 96-04%, grade 1 diastolic dysfunction.  3. CAD s/p CABG 1997 4. Acute on chronic renal insufficiency Stage III-IV 5. Prostate CA 6. Alzheimer's dementia 7. Failure to thrive 8. Elevated trop: ?demand ischemia?, would avoid invasive workup in this 81 yo male with dementia, may consider repeat echo to check EF and wall motion  Signed, Woodward Ku Pager: [phone]  Agree with note by Almyra Deforest PA-C  Diuresed. Feeling better.I/O neg.Lungs clear. Doubt PNA. Prob home AM. Would benefit from home O2. Would get O2 sat on RA.    Lorretta Harp, M.D., [place], [facility], [place]ta [place] [address] [place], [place]  [address]  [phone] 08/26/2014 1:14 PM

## 2014-08-26 NOTE — Progress Notes (Signed)
Utilization review completed. Audelia Knape, RN, BSN. 

## 2014-08-26 NOTE — Progress Notes (Signed)
SATURATION QUALIFICATIONS: (This note is used to comply with regulatory documentation for home oxygen)  Patient Saturations on Room Air at Rest = 99%  Patient Saturations on Room Air while Ambulating = not done  Patient Saturations on 2 Liters of oxygen while Ambulating = 82%  Please briefly explain why patient needs home oxygen:  Unable to walk without oxygen, had SOB getting out of bed.

## 2014-08-27 ENCOUNTER — Telehealth: Payer: Self-pay | Admitting: Physician Assistant

## 2014-08-27 LAB — BASIC METABOLIC PANEL
ANION GAP: 10 (ref 5–15)
BUN: 27 mg/dL — AB (ref 6–23)
CALCIUM: 9.1 mg/dL (ref 8.4–10.5)
CHLORIDE: 89 mmol/L — AB (ref 96–112)
CO2: 37 mmol/L — ABNORMAL HIGH (ref 19–32)
Creatinine, Ser: 2.27 mg/dL — ABNORMAL HIGH (ref 0.50–1.35)
GFR calc non Af Amer: 23 mL/min — ABNORMAL LOW (ref >90)
GFR, EST AFRICAN AMERICAN: 27 mL/min — AB (ref >90)
GLUCOSE: 127 mg/dL — AB (ref 70–99)
Potassium: 3 mmol/L — ABNORMAL LOW (ref 3.5–5.1)
Sodium: 136 mmol/L (ref 135–145)

## 2014-08-27 NOTE — Care Management Note (Signed)
    Page 1 of 2   08/27/2014     2:49:20 PM CARE MANAGEMENT NOTE 08/27/2014  Patient:  Billy Morgan,Billy Morgan   Account Number:  [id number]  Date Initiated:  08/26/2014  Documentation initiated by:  Felishia Wartman  Subjective/Objective Assessment:   Pt adm on 08/25/14 with respiratory failure, CHF.  PTA, pt resides at home with paid 24hr caregivers.     Action/Plan:   PT recommending HHPT at dc.  Pt also qualifies for home oxygen and will need home O2 at dc.   Anticipated DC Date:  08/27/2014   Anticipated DC Plan:  [place]  CM consult      [facility] Choice  HOME HEALTH   Choice offered to / List presented to:  C-2 HC POA / Guardian   DME arranged  [facility]      DME agency  [place] arranged  HH-2 PT      [place].   Status of service:  Completed, signed off Medicare Important Message given?  NA - LOS <3 / Initial given by admissions (If response is "NO", the following Medicare IM given date fields will be blank) Date Medicare IM given:   Medicare IM given by:   Date Additional Medicare IM given:   Additional Medicare IM given by:    Discharge Disposition:  [place]  Per UR Regulation:  Reviewed for med. necessity/level of care/duration of stay  If discussed at [place] of Stay Meetings, dates discussed:    Comments:  08/27/14 Ellan Lambert, RN, BSN [phone] PT recommending HHPT at dc, and pt does qualify for home O2, based on room air sats.  First choice for [facility] care was [place], but they are not in network with pt's insurance. 2nd choice is [facility].  Referral to [facility] for [facility] needs.  Start of care 24-48h post dc date.  Referral sent to [facility], per insurance contracts,  for home oxygen set up and portable tank; fax # [phone].  Portable tank to be delivered to pt room prior to dc.  08/26/14 Ellan Lambert, RN, BSN [phone] Met with pt and his home nurse,  [facility].  [name] has taken care of pt for 4 years, and works 90 hrs/week with pt, per her report.  Pt does have several children, but not all in this state.  [name] states she thinks of pt as family, and he spends holidays with her and her family. Pt has 24h caregivers paid for by the family.  Pt ambulates with a walker, but lately, [name] states pt has been SOB with activity, and she feels he may need home O2.  Assured her we will check sats to see if pt qualifies prior to dc.

## 2014-08-27 NOTE — Progress Notes (Signed)
Patient does not tolerate ambulation on R/A AEB oxygen sat of 82% on R/A with ambulation.  Pt very dyspneic.  Sats returned to 98-100% with O2 on 2LPM.

## 2014-08-27 NOTE — Progress Notes (Signed)
Patient Name: Billy Morgan Date of Encounter: 08/27/2014  Cardiologist: Dr. Liam Rogers    Principal Problem:   Acute respiratory failure with hypoxia Active Problems:   CAD, CABG X 5 '97. Low risk Myoview 1/14   Chronic renal disease, stage III, his SCr was up to 2.75 in November 2013 and diuretics held   Acute on chronic diastolic CHF (congestive heart failure)   CHF (congestive heart failure)    SUBJECTIVE  Denies any CP, patient denies any SOB, although per his nurse, he was a little SOB last night require his head of the bed to be raised.   CURRENT MEDS . albuterol  2.5 mg Nebulization QID  . aspirin  81 mg Oral Daily  . donepezil  5 mg Oral QHS  . enoxaparin (LOVENOX) injection  30 mg Subcutaneous Daily  . isosorbide mononitrate  30 mg Oral Daily  . ketoconazole  1 application Topical Daily  . metoprolol succinate  25 mg Oral Daily  . multivitamin with minerals  1 tablet Oral Daily  . polycarbophil  625 mg Oral Daily  . potassium chloride SA  20 mEq Oral Daily  . rosuvastatin  20 mg Oral Daily  . sertraline  25 mg Oral Daily  . sodium chloride  3 mL Intravenous Q12H  . torsemide  60 mg Oral Daily    OBJECTIVE  Filed Vitals:   08/26/14 2107 08/27/14 0539 08/27/14 0809 08/27/14 0911  BP: 99/59 134/62  106/53  Pulse: 78 73  75  Temp: 97.7 F (36.5 C) 97.6 F (36.4 C)  97.8 F (36.6 C)  TempSrc: Oral Oral  Oral  Resp: 18 18  19   Height:      Weight:  175 lb 6.4 oz (79.561 kg)    SpO2: 100% 99% 98% 99%    Intake/Output Summary (Last 24 hours) at 08/27/14 1134 Last data filed at 08/27/14 0539  Gross per 24 hour  Intake    700 ml  Output    500 ml  Net    200 ml   Filed Weights   08/25/14 1616 08/26/14 0621 08/27/14 0539  Weight: 214 lb 11.7 oz (97.4 kg) 174 lb (78.926 kg) 175 lb 6.4 oz (79.561 kg)    PHYSICAL EXAM  General: Pleasant, NAD. Neuro: Alert and oriented X 3. Moves all extremities spontaneously. Psych: Normal affect. HEENT:   Normal  Neck: Supple without bruits or JVD. Lungs:  Resp regular and unlabored, CTA, mild rale in R base, atelectasis? Heart: RRR no s3, s4, or murmurs. Abdomen: Soft, non-tender, non-distended, BS + x 4.  Extremities: No clubbing, cyanosis or edema. DP/PT/Radials 2+ and equal bilaterally.  Accessory Clinical Findings  CBC  Recent Labs  08/25/14 1726  WBC 11.9*  NEUTROABS 10.3*  HGB 15.4  HCT 45.7  MCV 94.4  PLT 448   Basic Metabolic Panel  Recent Labs  08/26/14 0400 08/27/14 0525  NA 139 136  K 3.4* 3.0*  CL 91* 89*  CO2 35* 37*  GLUCOSE 123* 127*  BUN 24* 27*  CREATININE 2.16* 2.27*  CALCIUM 9.6 9.1   Liver Function Tests  Recent Labs  08/25/14 1726  AST 36  ALT 21  ALKPHOS 70  BILITOT 0.8  PROT 7.6  ALBUMIN 3.7   Cardiac Enzymes  Recent Labs  08/25/14 1726 08/25/14 2208 08/26/14 0400  TROPONINI 0.10* 0.12* 0.16*   Thyroid Function Tests  Recent Labs  08/25/14 1726  TSH 1.325    TELE NSR with HR  80s    ECG  No new EKG  Echocardiogram 10/29/2013  LV EF: 55% -  60%  ------------------------------------------------------------ Indications:   CHF - 428.0.  ------------------------------------------------------------ History:  PMH:  Coronary artery disease. Congestive heart failure. Risk factors: Prostate cancer. Anxiety. Hearing loss. PVD. Venous insufficiency. GERD. Dementia. IBS. DJD. CKD stage III. Anasarca. Depression. Hypertension. Dyslipidemia.  ------------------------------------------------------------ Study Conclusions  - Left ventricle: The cavity size was normal. Wall thickness was normal. Systolic function was normal. The estimated ejection fraction was in the range of 55% to 60%. Wall motion was normal; there were no regional wall motion abnormalities. Doppler parameters are consistent with abnormal left ventricular relaxation (grade 1 diastolic dysfunction). - Aortic valve: Trivial  regurgitation. Impressions:  - Compared to 07/04/12, no significant change.    Radiology/Studies  Dg Chest 2 View  08/25/2014   CLINICAL DATA:  Shortness of breath.  CHF.  Hypertension.  Hypoxia.  EXAM: CHEST  2 VIEW  COMPARISON:  06/11/2014  FINDINGS: Postoperative changes in the mediastinum. Shallow inspiration with atelectasis in the lung bases. Normal heart size and pulmonary vascularity. Calcified pleural plaques suggesting prior asbestos exposure. No focal airspace disease or consolidation in the lungs. No blunting of costophrenic angles. No pneumothorax. Degenerative changes in the spine and shoulders. No significant change since prior study.  IMPRESSION: Shallow inspiration with atelectasis in the lung bases. Calcified pleural plaques. No acute changes since prior study.   Electronically Signed   By: Lucienne Capers M.D.   On: 08/25/2014 22:41    ASSESSMENT AND PLAN  1. Acute hypoxia  - previous baseline weight 184-185 lbs, per his caretaker, due to poor PO intake, his new baseline is around 178 lbs. Weight was 186 before caretaker gave him 1 dose of metolazone, weight down 6lbs. Edema improved, however continue to have hypoxia.   - did not put out significant amount urine based on I/O, however weight down to 174 lbs.   - discussed with his caretaker, apparently he has been receiving torsemide based on his daily weight at home. Per his caretaker, he usually only get 60mg  per day, and receive additional 20-40mg  as needed based on his weight. Torsemide decreased to 60mg  daily with additional torsemide as need if weight increase  - his O2 sat drop to 82 while on 2L O2, I have discussed with case manager, will obtain home PT and home O2, [facility] already has [facility]  - there was also some initial concern of possible PNA prior to admission which can cause dyspnea, however although WBC borderline high, will hold off on add Abx  - does have rale basilar rale, however has known atelectasis,  175 lb is likely his new baseline. Likely discharge today vs tomorrow.    2. Acute on chronic diastolic HF  - Echo EF 63-87%, grade 1 diastolic dysfunction.  3. CAD s/p CABG 1997 4. Acute on chronic renal insufficiency Stage III-IV 5. Prostate CA 6. Alzheimer's dementia 7. Failure to thrive 8. Elevated trop: ?demand ischemia?, would avoid invasive workup in this 81 yo male with dementia  Signed, Woodward Ku Pager: [phone]  Agree with note by Almyra Deforest PA-C  More comfortable this Am. Ready for DC home on O2. Has an occasional Right basilar crackle. TOC 7 the ROV with Dr. Merideth Abbey, M.D., [place], [facility], [place] [address] [place], [place]  [phone]  [phone] 08/27/2014 1:26 PM

## 2014-08-27 NOTE — Discharge Instructions (Signed)
Acute Respiratory Failure °Respiratory failure is when your lungs are not working well and your breathing (respiratory) system fails. When respiratory failure occurs, it is difficult for your lungs to get enough oxygen, get rid of carbon dioxide, or both. Respiratory failure can be life threatening.  °Respiratory failure can be acute or chronic. Acute respiratory failure is sudden, severe, and requires emergency medical treatment. Chronic respiratory failure is less severe, happens over time, and requires ongoing treatment.  °WHAT ARE THE CAUSES OF ACUTE RESPIRATORY FAILURE?  °Any problem affecting the heart or lungs can cause acute respiratory failure. Some of these causes include the following: °· Chronic bronchitis and emphysema (COPD).   °· Blood clot going to a lung (pulmonary embolism).   °· Having water in the lungs caused by heart failure, lung injury, or infection (pulmonary edema).   °· Collapsed lung (pneumothorax).   °· Pneumonia.   °· Pulmonary fibrosis.   °· Obesity.   °· Asthma.   °· Heart failure.   °· Any type of trauma to the chest that can make breathing difficult.   °· Nerve or muscle diseases making chest movements difficult. °WHAT SYMPTOMS SHOULD YOU WATCH FOR?  °If you have any of these signs or symptoms, you should seek immediate medical care:  °· You have shortness of breath (dyspnea) with or without activity.   °· You have rapid, fast breathing (tachypnea).   °· You are wheezing. °· You are unable to say more than a few words without having to catch your breath. °· You find it very difficult to function normally. °· You have a fast heart rate.   °· You have a bluish color to your finger or toe nail beds.   °· You have confusion or drowsiness or both.   °HOW WILL MY ACUTE RESPIRATORY FAILURE BE TREATED?  °Treatment of acute respiratory failure depends on the cause of the respiratory failure. Usually, you will stay in the intensive care unit so your breathing can be watched closely. Treatment  can include the following: °· Oxygen. Oxygen can be delivered through the following: °· Nasal cannula. This is small tubing that goes in your nose to give you oxygen. °· Face mask. A face mask covers your nose and mouth to give you oxygen. °· Medicine. Different medicines can be given to help with breathing. These can include: °· Nebulizers. Nebulizers deliver medicines to open the air passages (bronchodilators). These medicines help to open or relax the airways in the lungs so you can breathe better. They can also help loosen mucus from your lungs. °· Diuretics. Diuretic medicines can help you breathe better by getting rid of extra water in your body. °· Steroids. Steroid medicines can help decrease swelling (inflammation) in your lungs. °· Antibiotics. °· Chest tube. If you have a collapsed lung (pneumothorax), a chest tube is placed to help reinflate the lung. °· Non-invasive positive pressure ventilation (NPPV). This is a tight-fitting mask that goes over your nose and mouth. The mask has tubing that is attached to a machine. The machine blows air into the tubing, which helps to keep the tiny air sacs (alveoli) in your lungs open. This machine allows you to breathe on your own. °· Ventilator. A ventilator is a breathing machine. When on a ventilator, a breathing tube is put into the lungs. A ventilator is used when you can no longer breathe well enough on your own. You may have low oxygen levels or high carbon dioxide (CO2) levels in your blood. When you are on a ventilator, sedation and pain medicines are given to make you sleep   so your lungs can heal. Document Released: 07/22/2013 Document Revised: 12/01/2013 Document Reviewed: 07/22/2013 [facility] Patient Information 2015 [place], [place]. This information is not intended to replace advice given to you by your health care provider. Make sure you discuss any questions you have with your health care provider.  Heart Failure Heart failure means your heart has  trouble pumping blood. This makes it hard for your body to work well. Heart failure is usually a long-term (chronic) condition. You must take good care of yourself and follow your doctor's treatment plan. HOME CARE  Take your heart medicine as told by your doctor.  Do not stop taking medicine unless your doctor tells you to.  Do not skip any dose of medicine.  Refill your medicines before they run out.  Take other medicines only as told by your doctor or pharmacist.  Stay active if told by your doctor. The elderly and people with severe heart failure should talk with a doctor about physical activity.  Eat heart-healthy foods. Choose foods that are without trans fat and are low in saturated fat, cholesterol, and salt (sodium). This includes fresh or frozen fruits and vegetables, fish, lean meats, fat-free or low-fat dairy foods, whole grains, and high-fiber foods. Lentils and dried peas and beans (legumes) are also good choices.  Limit salt if told by your doctor.  Cook in a healthy way. Roast, grill, broil, bake, poach, steam, or stir-fry foods.  Limit fluids as told by your doctor.  Weigh yourself every morning. Do this after you pee (urinate) and before you eat breakfast. Write down your weight to give to your doctor.  Take your blood pressure and write it down if your doctor tells you to.  Ask your doctor how to check your pulse. Check your pulse as told.  Lose weight if told by your doctor.  Stop smoking or chewing tobacco. Do not use gum or patches that help you quit without your doctor's approval.  Schedule and go to doctor visits as told.  Nonpregnant women should have no more than 1 drink a day. Men should have no more than 2 drinks a day. Talk to your doctor about drinking alcohol.  Stop illegal drug use.  Stay current with shots (immunizations).  Manage your health conditions as told by your doctor.  Learn to manage your stress.  Rest when you are tired.  If  it is really hot outside:  Avoid intense activities.  Use air conditioning or fans, or get in a cooler place.  Avoid caffeine and alcohol.  Wear loose-fitting, lightweight, and light-colored clothing.  If it is really cold outside:  Avoid intense activities.  Layer your clothing.  Wear mittens or gloves, a hat, and a scarf when going outside.  Avoid alcohol.  Learn about heart failure and get support as needed.  Get help to maintain or improve your quality of life and your ability to care for yourself as needed. GET HELP IF:   You gain 03 lb/1.4 kg or more in 1 day or 05 lb/2.3 kg in a week.  You are more short of breath than usual.  You cannot do your normal activities.  You tire easily.  You cough more than normal, especially with activity.  You have any or more puffiness (swelling) in areas such as your hands, feet, ankles, or belly (abdomen).  You cannot sleep because it is hard to breathe.  You feel like your heart is beating fast (palpitations).  You get dizzy or light-headed when  you stand up. GET HELP RIGHT AWAY IF:   You have trouble breathing.  There is a change in mental status, such as becoming less alert or not being able to focus.  You have chest pain or discomfort.  You faint. MAKE SURE YOU:   Understand these instructions.  Will watch your condition.  Will get help right away if you are not doing well or get worse. Document Released: 04/25/2008 Document Revised: 12/01/2013 Document Reviewed: 09/02/2012 [facility] Patient Information 2015 [place], [place]. This information is not intended to replace advice given to you by your health care provider. Make sure you discuss any questions you have with your health care provider.  Please avoid salt in food as increased salt intake will increase fluid retention and exacerbate heart failure. Keep daily intake of fluid to <2 L. Also please weigh every day, adjust Demadex accordingly to keep dry weight  around 175 lbs. Please call cardiology if has increasing shortness of breath.

## 2014-08-27 NOTE — Evaluation (Signed)
Physical Therapy Evaluation Patient Details Name: Billy Morgan MRN: [id number] DOB: February 11, 1918 Today's Date: 08/27/2014   History of Present Illness  Admitted with acute respiratory failure with hypoxia.  Clinical Impression  Pt admitted with above diagnosis. Pt currently with functional limitations due to the deficits listed below (see PT Problem List).  Pt will benefit from skilled PT to increase their independence and safety with mobility to allow discharge to the venue listed below.  Pt has 24 hour caregivers and all DME needed.  Recommend HHPT and caregiver requesting [facility] as well.    Follow Up Recommendations Home health PT    Equipment Recommendations  None recommended by PT    Recommendations for Other Services OT consult (caregiver requesting HHOT)     Precautions / Restrictions Precautions Precautions: Fall      Mobility  Bed Mobility Overal bed mobility: Needs Assistance Bed Mobility: Supine to Sit     Supine to sit: Min assist     General bed mobility comments: Caregiver, Larena Glassman, assisted pt with transfer to sitting  Transfers Overall transfer level: Needs assistance Equipment used: Rolling walker (2 wheeled) Transfers: Sit to/from Stand Sit to Stand: Min assist         General transfer comment: pulling on RW and so impulsive and quick compounded with HOH that couldn't get pt to push up from bed. Stood from toilet with pulling on grab bar.  Ambulation/Gait Ambulation/Gait assistance: Min assist Ambulation Distance (Feet): 20 Feet (x2) Assistive device: Rolling walker (2 wheeled) Gait Pattern/deviations: Ataxic;Narrow base of support Gait velocity: Fast and impulsive   General Gait Details: Pt on 2 L/min and stayed in 90's and up to 99% after 1st gait.  Stairs            Wheelchair Mobility    Modified Rankin (Stroke Patients Only)       Balance Overall balance assessment: Needs assistance Sitting-balance support: No upper extremity  supported Sitting balance-Leahy Scale: Fair       Standing balance-Leahy Scale: Poor Standing balance comment: Pt very impulsive and unsafe with standing- requires someone with him at all times.                             Pertinent Vitals/Pain Pain Assessment: No/denies pain    Home Living Family/patient expects to be discharged to:: Private residence Living Arrangements: Non-relatives/Friends (private care) Available Help at Discharge: Available 24 hours/day;Personal care attendant Type of Home: House Home Access: Ramped entrance;Stairs to enter Entrance Stairs-Rails: Right Entrance Stairs-Number of Steps: 2 steps plus ramp Home Layout: One level Home Equipment: Grab bars - toilet;Grab bars - tub/shower;Wheelchair - manual;Hand held shower head;Toilet riser;Walker - 2 wheels;Tub bench;Hospital bed Additional Comments: lift chair    Prior Function Level of Independence: Needs assistance   Gait / Transfers Assistance Needed: required S to increased A depending on the day. Amb with cane til about a week ago when caregiver made him start using a RW.  Caregiver reports he fell recently that has aggravated R knee.           Hand Dominance   Dominant Hand: Right    Extremity/Trunk Assessment   Upper Extremity Assessment: Overall WFL for tasks assessed           Lower Extremity Assessment: Overall WFL for tasks assessed      Cervical / Trunk Assessment: Normal  Communication   Communication: HOH  Cognition Arousal/Alertness: Awake/alert Behavior During Therapy:  Impulsive;WFL for tasks assessed/performed Overall Cognitive Status: History of cognitive impairments - at baseline       Memory: Decreased recall of precautions;Decreased short-term memory              General Comments General comments (skin integrity, edema, etc.): Pt cooperative and follows simple 1 step loud commands.  Laughing and pleasant throughout.    Exercises         Assessment/Plan    PT Assessment Patient needs continued PT services  PT Diagnosis Abnormality of gait   PT Problem List Decreased balance;Decreased mobility;Decreased strength;Decreased cognition;Decreased safety awareness  PT Treatment Interventions DME instruction;Gait training;Functional mobility training;Therapeutic activities;Therapeutic exercise   PT Goals (Current goals can be found in the Care Plan section) Acute Rehab PT Goals Patient Stated Goal: caregiver goal- Ambulate safer and return home with 24 hour caregiver PT Goal Formulation: Patient unable to participate in goal setting Time For Goal Achievement: 09/10/14 Potential to Achieve Goals: Good    Frequency Min 3X/week   Barriers to discharge        Co-evaluation               End of Session Equipment Utilized During Treatment: Gait belt;Oxygen Activity Tolerance: Patient tolerated treatment well Patient left: in chair;with nursing/sitter in room;with family/visitor present Nurse Communication: Mobility status         Time: 1030-1051 PT Time Calculation (min) (ACUTE ONLY): 21 min   Charges:   PT Evaluation $Initial PT Evaluation Tier I: 1 Procedure     PT G Codes:       Brigg Cape L. Tamala Julian, [place] Pager [phone] 08/27/2014  Crandall Harvel LUBECK 08/27/2014, 11:07 AM

## 2014-08-27 NOTE — Discharge Summary (Signed)
Discharge Summary   Patient ID: Billy Morgan,  MRN: [id number], DOB/AGE: 1918-04-06 81 y.o.  Admit date: 08/25/2014 Discharge date: 08/27/2014  Primary Care Provider: NADEL,SCOTT M Primary Cardiologist: Dr. Acie Fredrickson  Discharge Diagnoses Principal Problem:   Acute respiratory failure with hypoxia - New baseline dry weight 175 lbs Active Problems:   Hyperlipidemia   Hearing loss   Essential hypertension   Peripheral vascular disease   Memory loss   CAD, CABG X 5 '97. Low risk Myoview 1/14   Chronic renal disease, stage III, his SCr was up to 2.75 in November 2013 and diuretics held   Dementia   Acute on chronic diastolic CHF (congestive heart failure)   CHF (congestive heart failure)   Allergies Allergies  Allergen Reactions  . Xanax [Alprazolam] Other (See Comments)    Severe agitation, very crazy erratic behaviors, psychosis, etc.   . Sulfamethoxazole Hives, Nausea And Vomiting and Rash    Procedures  Chest x ray 08/25/2014 IMPRESSION: Shallow inspiration with atelectasis in the lung bases. Calcified pleural plaques. No acute changes since prior study.    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 His previous baseline weight was 184-185 pounds. His caregiver has been managing his torsemide dose at home. He typically receive 60 mg torsemide on daily basis with additional dose up to a total of 100 mg per day if needed for weight gain. Due to poor appetite for the past month, patient has lost a lot of weight. His caregiver called in to cardiology office on 08/25/2014 complaining of increasing dyspnea and edema for the patient. He was seen in the clinic and subsequently admitted to [facility] for further evaluation. While in the office, it was noted his O2 saturation dropped to 87% upon arrival. It did go up to 97% on 2 L nasal cannula.  After arrival at [facility], significant laboratory finding include Cr of 2.12 whereas his previous baseline creatinine was 1.6 in early December. Troponin 0.1. BNP 32. White blood cell count 11.9. Chest x-ray did not reveal significant changes compared to the previous study, there was no focal airspace disease or consolidation in the lung to suggest a pneumonia. He was placed on IV diuresis with significant weight loss down to 175 pounds. His lower extremity edema has significantly improved. He was seen the morning of 08/27/2014, his shortness of breath has improved. Nurse emulated the patient and his O2 saturation dipped down to the low 80s with ambulation. He qualified for home oxygen as this time. Otherwise he is deemed stable for discharge. His new baseline dry weight is 175 pounds. Due to his advanced age and comorbidities, he has significant deconditioning. I will arrange transition of care follow-up in 7 days and follow-up with Dr. Acie Fredrickson as well after that.    Discharge Vitals Blood pressure 106/53, pulse 75, temperature 97.8 F (36.6 C), temperature source Oral, resp. rate 19, height 6' (1.829 m), weight 175 lb 6.4 oz (79.561 kg), SpO2 99 %.  Filed Weights   08/25/14 1616 08/26/14 0621 08/27/14 0539  Weight: 214 lb 11.7 oz (97.4 kg) 174 lb (78.926 kg) 175 lb 6.4 oz (79.561 kg)    Labs  CBC  Recent Labs  08/25/14 1726  WBC 11.9*  NEUTROABS 10.3*  HGB 15.4  HCT 45.7  MCV 94.4  PLT 818   Basic Metabolic Panel  Recent Labs  08/26/14  0400 08/27/14 0525  NA 139 136  K 3.4* 3.0*  CL 91* 89*  CO2 35* 37*  GLUCOSE 123* 127*  BUN 24* 27*  CREATININE 2.16* 2.27*  CALCIUM 9.6 9.1   Liver Function Tests  Recent Labs  08/25/14 1726  AST 36  ALT 21  ALKPHOS 70  BILITOT 0.8  PROT 7.6  ALBUMIN 3.7   Cardiac Enzymes  Recent Labs  08/25/14 1726 08/25/14 2208 08/26/14 0400  TROPONINI 0.10* 0.12* 0.16*   Thyroid Function Tests  Recent Labs  08/25/14 1726  TSH 1.325     Disposition  Pt is being discharged home today in good condition.  Follow-up Plans & Appointments      Follow-up Information    Follow up with Eileen Stanford, PA-C On 09/03/2014.   Specialty:  Cardiology   Why:  10:00am.   Contact information:   [address] [place] [address] [phone]       Follow up with Nahser, Wonda Cheng, MD On 09/17/2014.   Specialty:  Cardiology   Why:  2:00pm   Contact information:   [address] [place] [address] [phone]       Discharge Medications    Medication List    TAKE these medications        acetaminophen 325 MG tablet  Commonly known as:  TYLENOL  Take 650 mg by mouth every 6 (six) hours as needed for moderate pain (knee pain).     aspirin 81 MG tablet  Take 81 mg by mouth daily.     calcium carbonate 500 MG chewable tablet  Commonly known as:  TUMS - dosed in mg elemental calcium  Chew 2 tablets by mouth daily as needed for indigestion or heartburn (indigestion).     donepezil 5 MG tablet  Commonly known as:  ARICEPT  take 1 tablet by mouth at bedtime     FIBER SELECT GUMMIES PO  Take 1 tablet by mouth daily as needed (hold for next day, if bowel movement is attained).     isosorbide mononitrate 60 MG 24 hr tablet  Commonly known as:  IMDUR  30 mg every morning and 60 mg every evening     ketoconazole 2 % cream  Commonly known as:  NIZORAL  Apply 1 application topically daily. To left side of face, behind ears, and to scalp     Melatonin 5 MG Tabs  Take 1 tablet by mouth at bedtime.     Menthol (Topical Analgesic) 4 % Gel  Apply 1 application topically as needed (for joint pain).     metolazone 2.5 MG tablet  Commonly known as:  ZAROXOLYN  Take 2.5 mg by mouth daily as needed (weight gain).     metoprolol succinate 50 MG 24 hr tablet  Commonly known as:  TOPROL-XL  Take 1 tablet (50 mg total) by mouth daily.     multivitamin with minerals Tabs tablet   Take 2 tablets by mouth daily.     nitroGLYCERIN 0.4 MG SL tablet  Commonly known as:  NITROSTAT  Place 1 tablet (0.4 mg total) under the tongue every 5 (five) minutes as needed. For chest pain     potassium chloride SA 20 MEQ tablet  Commonly known as:  K-DUR,KLOR-CON  Take 1 tablet (20 mEq total) by mouth daily.     rosuvastatin 20 MG tablet  Commonly known as:  CRESTOR  Take 1 tablet (20 mg total) by mouth  daily.     sertraline 25 MG tablet  Commonly known as:  ZOLOFT  take 1 tablet by mouth once daily     torsemide 20 MG tablet  Commonly known as:  DEMADEX  Take 5 tablets (100 mg total) by mouth daily.     trolamine salicylate 10 % cream  Commonly known as:  ASPERCREME  Apply 1 application topically as needed for muscle pain.        Duration of Discharge Encounter   Greater than 30 minutes including physician time.  Hilbert Corrigan PA-C Pager: [phone] 08/27/2014, 2:23 PM

## 2014-08-27 NOTE — Telephone Encounter (Signed)
New message     TCM appt on 09-03-14 with Katie---per Isaac Laud.

## 2014-08-28 ENCOUNTER — Encounter: Payer: Self-pay | Admitting: Cardiovascular Disease

## 2014-08-28 NOTE — Telephone Encounter (Signed)
Patient contacted regarding discharge from [facility] on 08/27/14.  Patient understands to follow up with provider Bennett Scrape on  at 10:00 at [facility]. Patient understands discharge instructions? yes Patient understands medications and regiment? yes Patient understands to bring all medications to this visit? yes  Spoke w/Son (POA) who states he seems to be doing good.  Received his O2 about 1 hr after getting home.  Son states he has a 24 hr care giver Jeralyn Bennett) that takes care of his medications and will be coming with him for his appointment.  States she has full authorization to provide care for him.  He states they are just trying to make him comfortable.  No questions or concerns at this time.

## 2014-09-02 ENCOUNTER — Telehealth: Payer: Self-pay | Admitting: Cardiovascular Disease

## 2014-09-02 NOTE — Telephone Encounter (Signed)
After discussion with Dr. Acie Fredrickson and his conversation with patient's family (daughter, Lovey Newcomer) and caregiver, Eula Fried, patient was referred to [facility].  I spoke with Adela Lank, LPN with [facility] and he will begin process of setting up end of life care for patient.  I spoke with [name] who verbalized understanding and agreement and thanked me for the call.  I advised her to call back with questions or concerns.

## 2014-09-02 NOTE — Telephone Encounter (Signed)
Spoke with [name], patient's caregiver, who reports patient has declined significantly since being discharged from [facility] on 1/28.  She states patient is struggling to breath; reports O2 sats 97%; weight today 177 lb (baseline from hospital 175 lb).  She reports patient is receiving 24 hour care, however there are no standing orders for medications to make patient comfortable.  I reviewed patient's medications and advised that she may give patient Zaroxolyn 2.5 mg x 1 due to today's weight gain.  She reports vital signs:  BP 109/85, HR 86 bpm (reports heart rate has ranged from 65-86 bpm).  [name] reports that she was told to hold patient's Metoprolol and Imdur due to patient's low blood pressure, however I have not located this in the discharge instructions.   I advised [name] that I will talk with Dr. Acie Fredrickson for further advice and will call her back; she verbalized understanding and agreement.

## 2014-09-02 NOTE — Telephone Encounter (Signed)
New Msg         Pt Nurse Mardene Celeste calling, states pt isn't doing very well, no other details given.    Please return call to [phone].

## 2014-09-03 ENCOUNTER — Telehealth: Payer: Self-pay | Admitting: Cardiovascular Disease

## 2014-09-03 ENCOUNTER — Encounter (INDEPENDENT_AMBULATORY_CARE_PROVIDER_SITE_OTHER): Payer: Medicare HMO | Admitting: Physician Assistant

## 2014-09-03 ENCOUNTER — Encounter: Payer: Self-pay | Admitting: Physician Assistant

## 2014-09-03 NOTE — Progress Notes (Deleted)
Cardiology Office Note   Date:  09/03/2014   ID:  Billy Morgan, DOB 1918-02-01, MRN [id number]  PCP:  Noralee Space, MD  Cardiologist:  Dr. Acie Fredrickson   No chief complaint on file.  Post hopsital follow up for chronic diastolic CHF   History of Present Illness: Billy Morgan is a 81 y.o. 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 His previous baseline weight was 184-185 pounds. His caregiver has been managing his torsemide dose at home. He typically receive 60 mg torsemide on daily basis with additional dose up to a total of 100 mg per day if needed for weight gain. Due to poor appetite for the past month, patient has lost a lot of weight. His caregiver called in to cardiology office on 08/25/2014 complaining of increasing dyspnea and edema for the patient. He was seen in the clinic and subsequently admitted to [facility] for further evaluation. While in the office, it was noted his O2 saturation dropped to 87% upon arrival. It did go up to 97% on 2 L nasal cannula.  After arrival at [facility], significant laboratory finding include Cr of 2.12 whereas his previous baseline creatinine was 1.6 in early December. Troponin 0.1. BNP 32. White blood cell count 11.9. Chest x-ray did not reveal significant changes compared to the previous study, there was no focal airspace disease or consolidation in the lung to suggest a pneumonia. He was placed on IV diuresis with significant weight loss down to 175 pounds. His lower extremity edema has significantly improved. He was seen the morning of 08/27/2014, his shortness of breath has improved. Nurse emulated the patient and his O2 saturation dipped down to the low 80s with ambulation. He qualified for home oxygen as this time. Otherwise he is deemed stable for discharge. His new baseline dry weight is 175 pounds. Due to his  advanced age and comorbidities, he has significant deconditioning. I will arrange transition of care follow-up in 7 days and follow-up with Dr. Acie Fredrickson as well after that.      Past Medical History  Diagnosis Date  . Unspecified hearing loss   . HTN (hypertension)   . CAD (coronary artery disease)     a. 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 b. Low risk Myoview 1/14.  Marland Kitchen Peripheral vascular disease, unspecified   . Venous insufficiency   . Other and unspecified hyperlipidemia   . Esophageal reflux   . Irritable bowel syndrome   . Osteoarthrosis, unspecified whether generalized or localized, unspecified site   . Anxiety state, unspecified   . Malignant neoplasm of prostate   . Dementia   . Anasarca 09/09/2012  . Hx of echocardiogram 2013    EF 55%-65% with grade 1 diatolic dyfunction.  Marland Kitchen History of stress test 07/2012    normal perfusion without scar or ischemia.  . CKD (chronic kidney disease), stage III     a. Hx of more acute RF in 2013 - Cr 2.75. b. Baseline ~1.6.  . Chronic diastolic CHF (congestive heart failure)   . Prostate cancer   . Chronic edema     Past Surgical History  Procedure Laterality Date  . Inguinal hernia repair    . Appendectomy    . Coronary artery bypass graft  1997    CABG x5  . Cardiac catheterization  07/01/1996    progressive CAD; preserved  global LV function; coronary calcification involving teh LAD and RCA with progressive stnosis.   . Cardiac catheterization  07/19/1992    nl LV function; single vessel CAD involving the LAD with 40% proximal stenosis, 70% focal mid stenosis and 50% first diagonal stenosis     Current Outpatient Prescriptions  Medication Sig Dispense Refill  . acetaminophen (TYLENOL) 325 MG tablet Take 650 mg by mouth every 6 (six) hours as needed for moderate pain (knee pain).    Marland Kitchen aspirin 81 MG tablet Take 81 mg by mouth daily.      . calcium carbonate (TUMS - DOSED IN MG ELEMENTAL CALCIUM) 500 MG chewable tablet Chew 2 tablets by mouth daily as  needed for indigestion or heartburn (indigestion).    Marland Kitchen donepezil (ARICEPT) 5 MG tablet take 1 tablet by mouth at bedtime 90 tablet 3  . FIBER SELECT GUMMIES PO Take 1 tablet by mouth daily as needed (hold for next day, if bowel movement is attained).     . isosorbide mononitrate (IMDUR) 60 MG 24 hr tablet 30 mg every morning and 60 mg every evening (Patient taking differently: Take 60 mg by mouth as needed (heartrate). 30 mg every morning and 60 mg every evening) 45 tablet 10  . ketoconazole (NIZORAL) 2 % cream Apply 1 application topically daily. To left side of face, behind ears, and to scalp    . Melatonin 5 MG TABS Take 1 tablet by mouth at bedtime.     . Menthol, Topical Analgesic, 4 % GEL Apply 1 application topically as needed (for joint pain).    Marland Kitchen metolazone (ZAROXOLYN) 2.5 MG tablet Take 2.5 mg by mouth daily as needed (weight gain).     . metoprolol succinate (TOPROL-XL) 50 MG 24 hr tablet Take 1 tablet (50 mg total) by mouth daily. (Patient taking differently: Take 50 mg by mouth as needed. ) 30 tablet 6  . Multiple Vitamin (MULTIVITAMIN WITH MINERALS) TABS Take 2 tablets by mouth daily.    . nitroGLYCERIN (NITROSTAT) 0.4 MG SL tablet Place 1 tablet (0.4 mg total) under the tongue every 5 (five) minutes as needed. For chest pain 180 tablet 1  . potassium chloride SA (K-DUR,KLOR-CON) 20 MEQ tablet Take 1 tablet (20 mEq total) by mouth daily. 30 tablet 6  . rosuvastatin (CRESTOR) 20 MG tablet Take 1 tablet (20 mg total) by mouth daily. 30 tablet 11  . sertraline (ZOLOFT) 25 MG tablet take 1 tablet by mouth once daily (Patient taking differently: Take 25 mg by mouth at bedtime. take 1 tablet by mouth once daily) 90 tablet 3  . torsemide (DEMADEX) 20 MG tablet Take 5 tablets (100 mg total) by mouth daily. (Patient taking differently: Take 60 mg by mouth daily. ) 270 tablet 3  . trolamine salicylate (ASPERCREME) 10 % cream Apply 1 application topically as needed for muscle pain.     No  current facility-administered medications for this visit.    Allergies:   Xanax and Sulfamethoxazole    Social History:  The patient  reports that he quit smoking about 60 years ago. He has never used smokeless tobacco. He reports that he does not drink alcohol or use illicit drugs.   Family History:  The patient's ***family history includes Cancer in his mother; Heart disease in his father; Hypertension in his mother. There is no history of Colon cancer, Heart attack, or Stroke.    ROS:  Please see the history of present illness.   Otherwise, review of systems  are positive for {NONE DEFAULTED:18576}.   All other systems are reviewed and negative.    PHYSICAL EXAM: VS:  There were no vitals taken for this visit. , BMI There is no weight on file to calculate BMI. GEN: Well nourished, well developed, in no acute distress HEENT: normal Neck: no JVD, carotid bruits, or masses Cardiac: ***RRR; no murmurs, rubs, or gallops,no edema  Respiratory:  clear to auscultation bilaterally, normal work of breathing GI: soft, nontender, nondistended, + BS MS: no deformity or atrophy Skin: warm and dry, no rash Neuro:  Strength and sensation are intact Psych: euthymic mood, full affect   EKG:  EKG {ACTION; IS/IS NGE:[id number]} ordered today. The ekg ordered today demonstrates ***   Recent Labs: 06/11/2014: Pro B Natriuretic peptide (BNP) 991.8* 08/25/2014: ALT 21; B Natriuretic Peptide 32.1; Hemoglobin 15.4; Platelets 238; TSH 1.325 08/27/2014: BUN 27*; Creatinine 2.27*; Potassium 3.0*; Sodium 136    Lipid Panel    Component Value Date/Time   CHOL 139 10/17/2013 1335   TRIG 198* 10/17/2013 1335   HDL 33* 10/17/2013 1335   CHOLHDL 4.2 10/17/2013 1335   VLDL 40 10/17/2013 1335   LDLCALC 66 10/17/2013 1335   LDLDIRECT 176.3 04/01/2009 0849      Wt Readings from Last 3 Encounters:  08/27/14 175 lb 6.4 oz (79.561 kg)  08/25/14 180 lb (81.647 kg)  06/30/14 191 lb (86.637 kg)      Other  studies Reviewed: Additional studies/ records that were reviewed today include: ***. Review of the above records demonstrates: ***   ASSESSMENT AND PLAN:  1.  ***   Current medicines are reviewed at length with the patient today.  The patient {ACTIONS; HAS/DOES NOT HAVE:19233} concerns regarding medicines.  The following changes have been made:  {PLAN; NO CHANGE:13088:s}  Labs/ tests ordered today include: *** No orders of the defined types were placed in this encounter.     Disposition:   FU with *** in {gen number 4-40:102725} {TIME; UNITS DAY/WEEK/MONTH:19136}   Signed, Crista Luria  09/03/2014 6:22 AM    [facility] [address], [place], [place]  [address] Phone: [phone]; Fax: [phone]

## 2014-09-03 NOTE — Telephone Encounter (Signed)
Telephone note faxed to [phone], Attn: Bethena Roys or Lorriane Shire.

## 2014-09-03 NOTE — Telephone Encounter (Signed)
Spoke with Bethena Roys, nurse with [facility] who did home visit with patient today to admit patient to hospice Bethena Roys would like an order for comfort medications: Ativan 0.5 mg q 4-6 for agitation Morphine 0.25 mg to 1 mg q 4 hours for SOB or pain Atropine for secretions - 1% 4 gtts sublingual every 2-3 hours for secretions   She states she will take DNR to family tomorrow   I advised that I will forward to Dr. Acie Fredrickson for agreement and/or adjustment of above orders and will fax to her office once orders are signed.  Fax is [phone] Bethena Roys verbalized understanding and agreement

## 2014-09-03 NOTE — Telephone Encounter (Signed)
I agree with the PRN meds as listed with the attached note from Christen Bame, RN

## 2014-09-03 NOTE — Telephone Encounter (Signed)
New Msg       Vale Haven calling from [employer] and would like to ask a few questions about pt.    Please return call.

## 2014-09-04 ENCOUNTER — Telehealth: Payer: Self-pay | Admitting: Physician Assistant

## 2014-09-04 ENCOUNTER — Telehealth: Payer: Self-pay | Admitting: Cardiovascular Disease

## 2014-09-04 MED ORDER — MORPHINE SULFATE (CONCENTRATE) 20 MG/ML PO SOLN: ORAL | Status: DC

## 2014-09-04 MED ORDER — MORPHINE SULFATE (CONCENTRATE) 20 MG/ML PO SOLN: ORAL | Status: AC

## 2014-09-04 NOTE — Telephone Encounter (Signed)
Clarification needed on morphine order. Resent.  Selina Tapper, PAC

## 2014-09-04 NOTE — Telephone Encounter (Signed)
[place]; left message with operator who states the office is closed I spoke with Quita Skye, the on call provider, who does not know what the question was and advised that if if is something that cannot wait until Monday, that he will call the [facility] provider on call.

## 2014-09-04 NOTE — Telephone Encounter (Signed)
New message      Need clarification on an order

## 2014-09-17 ENCOUNTER — Ambulatory Visit: Payer: Medicare HMO | Admitting: Cardiovascular Disease

## 2014-10-07 ENCOUNTER — Telehealth: Payer: Self-pay | Admitting: Cardiovascular Disease

## 2014-10-07 NOTE — Telephone Encounter (Signed)
Called and spoke with Freda Munro with [facility].  Freda Munro states patient has what appears to be a carbuncle on the left foot.  She states the site is soft and supple, warm to touch and swollen, with redness across top and bottom of foot.  She states it is painful for the patient to stand on affected foot. She reports patient's temperature today is 98.2.  She states the concern is that the site looks like it is just about ready to open up and infection will be staph; states someone in the home had advised the hospice aide to soak the foot in warm water and vinegar.  Freda Munro states she advised them not to do that and is calling to request an antibiotic for the patient and/or any additional advice for treating the site.  I advised Freda Munro that Dr. Acie Fredrickson is in the hospital today and that I will send him the message for advice.  Freda Munro verbalized understanding and agreement.

## 2014-10-07 NOTE — Telephone Encounter (Signed)
I called and spoke with Freda Munro and reviewed Dr. Elmarie Shiley advice with her.  She states that she will call their medical director for help as there is no PCP listed for patient other than Dr. Lenna Gilford who is patient's pulmonologist.  Freda Munro thanked me for the call.

## 2014-10-07 NOTE — Telephone Encounter (Signed)
This is outside the scope of my practice.  He will need to go to his medical doctor ( ? Or possibly via ambulance to the urgent care or ER) and get this evaluated. It may be able to be treated with simple incision and drainage and PO abx but I'm not going to be able to make that decision.

## 2014-10-07 NOTE — Telephone Encounter (Signed)
New message     Does patient need any antibiotic    C/O left foot swollen .soft . Quarter size. Pain when standing.   Vital signs yesterday 92.2 ,  58,  18 , 116/72 pulse 98 % on 3 liter of ox.

## 2014-10-09 ENCOUNTER — Telehealth: Payer: Self-pay | Admitting: Pulmonary Disease

## 2014-10-09 NOTE — Telephone Encounter (Signed)
Needs to go to UC and have them do I + D if appropriate  No need to worry about sepsis but he'll fee better for sure when the pressure gets relieved

## 2014-10-09 NOTE — Telephone Encounter (Signed)
Spoke with [place] of rec's per Dr Melvyn Novas. Freda Munro states that the patient is unable to stand on foot and is not able to ambulate. I advised her that he needs treatment so if they cannot get him to the UC they may need to call EMS to transport him - EMS can either transport patient to UC or ED. Freda Munro states that she thinks taking him to the ED would be a better idea. She states that she is going to discuss everything with her supervisor and decide what to do. Will send to Dr Melvyn Novas and Lenna Gilford as Juluis Rainier.

## 2014-10-09 NOTE — Telephone Encounter (Signed)
Spoke with Guerry Minors - states that the patient has a carbuncle on his left foot, big knuckle at great toe.  Carbuncle festered about 2 weeks ago and is now at a head.  Area is very swollen, red and soft to the touch. Freda Munro states that the area is not hot to the touch but is causing the patient a great deal of pain to stand. Pt is not very mobile d/t to this. Family has been soaking foot in warm salt water with vinegar and was advised to take away the vinegar by staff as this is an acid and may make worse. The family is now soaking foot in warm salt water with baking soda. Freda Munro reports that carbuncle is very full of fluid and she is concerned that it is going to burst and if it does there may be a risk of the patient becoming septic. States that they were unable to obtain the patient's PCP(Nadel) until today; have spoken with Dr Acie Fredrickson this week(Tuesday) and sent him a picture and he advised pt see PCP. Freda Munro then spoke with Medical Director [name]) and she suggested UC visit - Freda Munro spoke with [name] again today and she rec patient go to ED.  I advised her that Dr Lenna Gilford is not in office and that this message would be sent to [facility] on call, Dr Melvyn Novas, Freda Munro requested to be able to send a picture of Carbuncle to Dr Melvyn Novas to look at. I advised that with information given, the patient may need to go straight to ED to be evaluated and treated with proper abx's (IV if need be).  Please advise Dr Melvyn Novas. Thanks! Allergies  Allergen Reactions  . Xanax [Alprazolam] Other (See Comments)    Severe agitation, very crazy erratic behaviors, psychosis, etc.   . Sulfamethoxazole Hives, Nausea And Vomiting and Rash

## 2014-10-11 ENCOUNTER — Emergency Department (HOSPITAL_COMMUNITY): Payer: Medicare Other

## 2014-10-11 ENCOUNTER — Inpatient Hospital Stay (HOSPITAL_COMMUNITY)
Admission: EM | Admit: 2014-10-11 | Discharge: 2014-10-12 | DRG: 540 | Disposition: A | Discharge status: Hospice | Payer: Medicare Other | Attending: Internal Medicine | Admitting: Internal Medicine

## 2014-10-11 ENCOUNTER — Inpatient Hospital Stay (HOSPITAL_COMMUNITY): Payer: Medicare Other

## 2014-10-11 ENCOUNTER — Encounter (HOSPITAL_COMMUNITY): Payer: Self-pay | Admitting: Emergency Medicine

## 2014-10-11 DIAGNOSIS — M1A9XX0 Chronic gout, unspecified, without tophus (tophi): Secondary | ICD-10-CM | POA: Diagnosis not present

## 2014-10-11 DIAGNOSIS — Z7982 Long term (current) use of aspirin: Secondary | ICD-10-CM | POA: Diagnosis not present

## 2014-10-11 DIAGNOSIS — I739 Peripheral vascular disease, unspecified: Secondary | ICD-10-CM | POA: Diagnosis present

## 2014-10-11 DIAGNOSIS — F0280 Dementia in other diseases classified elsewhere without behavioral disturbance: Secondary | ICD-10-CM | POA: Diagnosis present

## 2014-10-11 DIAGNOSIS — H9190 Unspecified hearing loss, unspecified ear: Secondary | ICD-10-CM | POA: Diagnosis present

## 2014-10-11 DIAGNOSIS — K219 Gastro-esophageal reflux disease without esophagitis: Secondary | ICD-10-CM | POA: Diagnosis present

## 2014-10-11 DIAGNOSIS — L03116 Cellulitis of left lower limb: Secondary | ICD-10-CM | POA: Diagnosis present

## 2014-10-11 DIAGNOSIS — F411 Generalized anxiety disorder: Secondary | ICD-10-CM | POA: Diagnosis present

## 2014-10-11 DIAGNOSIS — Z882 Allergy status to sulfonamides status: Secondary | ICD-10-CM

## 2014-10-11 DIAGNOSIS — Z515 Encounter for palliative care: Secondary | ICD-10-CM

## 2014-10-11 DIAGNOSIS — Z888 Allergy status to other drugs, medicaments and biological substances status: Secondary | ICD-10-CM

## 2014-10-11 DIAGNOSIS — Z809 Family history of malignant neoplasm, unspecified: Secondary | ICD-10-CM

## 2014-10-11 DIAGNOSIS — L089 Local infection of the skin and subcutaneous tissue, unspecified: Secondary | ICD-10-CM | POA: Diagnosis not present

## 2014-10-11 DIAGNOSIS — Z951 Presence of aortocoronary bypass graft: Secondary | ICD-10-CM

## 2014-10-11 DIAGNOSIS — Z23 Encounter for immunization: Secondary | ICD-10-CM | POA: Diagnosis not present

## 2014-10-11 DIAGNOSIS — Z87891 Personal history of nicotine dependence: Secondary | ICD-10-CM | POA: Diagnosis not present

## 2014-10-11 DIAGNOSIS — F0390 Unspecified dementia without behavioral disturbance: Secondary | ICD-10-CM | POA: Diagnosis present

## 2014-10-11 DIAGNOSIS — Z8249 Family history of ischemic heart disease and other diseases of the circulatory system: Secondary | ICD-10-CM

## 2014-10-11 DIAGNOSIS — Z66 Do not resuscitate: Secondary | ICD-10-CM | POA: Diagnosis present

## 2014-10-11 DIAGNOSIS — Z9981 Dependence on supplemental oxygen: Secondary | ICD-10-CM

## 2014-10-11 DIAGNOSIS — I10 Essential (primary) hypertension: Secondary | ICD-10-CM | POA: Diagnosis present

## 2014-10-11 DIAGNOSIS — I2510 Atherosclerotic heart disease of native coronary artery without angina pectoris: Secondary | ICD-10-CM | POA: Diagnosis not present

## 2014-10-11 DIAGNOSIS — M869 Osteomyelitis, unspecified: Principal | ICD-10-CM | POA: Diagnosis present

## 2014-10-11 DIAGNOSIS — N183 Chronic kidney disease, stage 3 unspecified: Secondary | ICD-10-CM | POA: Diagnosis present

## 2014-10-11 DIAGNOSIS — I129 Hypertensive chronic kidney disease with stage 1 through stage 4 chronic kidney disease, or unspecified chronic kidney disease: Secondary | ICD-10-CM | POA: Diagnosis not present

## 2014-10-11 DIAGNOSIS — K589 Irritable bowel syndrome without diarrhea: Secondary | ICD-10-CM | POA: Diagnosis present

## 2014-10-11 DIAGNOSIS — I872 Venous insufficiency (chronic) (peripheral): Secondary | ICD-10-CM | POA: Diagnosis not present

## 2014-10-11 DIAGNOSIS — I5032 Chronic diastolic (congestive) heart failure: Secondary | ICD-10-CM | POA: Diagnosis present

## 2014-10-11 DIAGNOSIS — G309 Alzheimer's disease, unspecified: Secondary | ICD-10-CM | POA: Diagnosis present

## 2014-10-11 DIAGNOSIS — M109 Gout, unspecified: Secondary | ICD-10-CM | POA: Diagnosis present

## 2014-10-11 DIAGNOSIS — Z8546 Personal history of malignant neoplasm of prostate: Secondary | ICD-10-CM

## 2014-10-11 DIAGNOSIS — E785 Hyperlipidemia, unspecified: Secondary | ICD-10-CM | POA: Diagnosis not present

## 2014-10-11 DIAGNOSIS — L97529 Non-pressure chronic ulcer of other part of left foot with unspecified severity: Secondary | ICD-10-CM | POA: Diagnosis present

## 2014-10-11 DIAGNOSIS — Z79899 Other long term (current) drug therapy: Secondary | ICD-10-CM | POA: Diagnosis not present

## 2014-10-11 DIAGNOSIS — M1990 Unspecified osteoarthritis, unspecified site: Secondary | ICD-10-CM | POA: Diagnosis not present

## 2014-10-11 DIAGNOSIS — I509 Heart failure, unspecified: Secondary | ICD-10-CM

## 2014-10-11 LAB — URINALYSIS, ROUTINE W REFLEX MICROSCOPIC
Bilirubin Urine: NEGATIVE
GLUCOSE, UA: NEGATIVE mg/dL
Hgb urine dipstick: NEGATIVE
Ketones, ur: NEGATIVE mg/dL
LEUKOCYTES UA: NEGATIVE
NITRITE: NEGATIVE
PH: 8 (ref 5.0–8.0)
PROTEIN: NEGATIVE mg/dL
Specific Gravity, Urine: 1.014 (ref 1.005–1.030)
UROBILINOGEN UA: 1 mg/dL (ref 0.0–1.0)

## 2014-10-11 LAB — CBC WITH DIFFERENTIAL/PLATELET
Basophils Absolute: 0 10*3/uL (ref 0.0–0.1)
Basophils Relative: 0 % (ref 0–1)
EOS ABS: 0.2 10*3/uL (ref 0.0–0.7)
Eosinophils Relative: 2 % (ref 0–5)
HCT: 42.3 % (ref 39.0–52.0)
Hemoglobin: 13.8 g/dL (ref 13.0–17.0)
LYMPHS PCT: 18 % (ref 12–46)
Lymphs Abs: 1.9 10*3/uL (ref 0.7–4.0)
MCH: 32 pg (ref 26.0–34.0)
MCHC: 32.6 g/dL (ref 30.0–36.0)
MCV: 98.1 fL (ref 78.0–100.0)
MONOS PCT: 11 % (ref 3–12)
Monocytes Absolute: 1.2 10*3/uL — ABNORMAL HIGH (ref 0.1–1.0)
NEUTROS PCT: 69 % (ref 43–77)
Neutro Abs: 7.6 10*3/uL (ref 1.7–7.7)
Platelets: 305 10*3/uL (ref 150–400)
RBC: 4.31 MIL/uL (ref 4.22–5.81)
RDW: 13.8 % (ref 11.5–15.5)
WBC: 10.9 10*3/uL — ABNORMAL HIGH (ref 4.0–10.5)

## 2014-10-11 LAB — I-STAT CHEM 8, ED
BUN: 30 mg/dL — ABNORMAL HIGH (ref 6–23)
CHLORIDE: 94 mmol/L — AB (ref 96–112)
Calcium, Ion: 1.11 mmol/L — ABNORMAL LOW (ref 1.13–1.30)
Creatinine, Ser: 1.2 mg/dL (ref 0.50–1.35)
Glucose, Bld: 99 mg/dL (ref 70–99)
HCT: 44 % (ref 39.0–52.0)
Hemoglobin: 15 g/dL (ref 13.0–17.0)
POTASSIUM: 4.5 mmol/L (ref 3.5–5.1)
SODIUM: 137 mmol/L (ref 135–145)
TCO2: 31 mmol/L (ref 0–100)

## 2014-10-11 LAB — SEDIMENTATION RATE: Sed Rate: 104 mm/hr — ABNORMAL HIGH (ref 0–16)

## 2014-10-11 LAB — I-STAT CG4 LACTIC ACID, ED: Lactic Acid, Venous: 3 mmol/L (ref 0.5–2.0)

## 2014-10-11 LAB — CREATININE, SERUM
CREATININE: 1.32 mg/dL (ref 0.50–1.35)
GFR calc Af Amer: 52 mL/min — ABNORMAL LOW (ref >90)
GFR calc non Af Amer: 44 mL/min — ABNORMAL LOW (ref >90)

## 2014-10-11 MED ORDER — NAFCILLIN SODIUM 2 G IJ SOLR
2.0000 g | Freq: Once | INTRAVENOUS | Status: AC
  Administered 2014-10-11: 2 g via INTRAVENOUS
  Filled 2014-10-11: qty 2000

## 2014-10-11 MED ORDER — NITROGLYCERIN 0.4 MG SL SUBL: 0.4000 mg | SUBLINGUAL_TABLET | SUBLINGUAL | Status: DC | PRN

## 2014-10-11 MED ORDER — LEVOFLOXACIN IN D5W 750 MG/150ML IV SOLN
750.0000 mg | INTRAVENOUS | Status: DC
  Administered 2014-10-12: 750 mg via INTRAVENOUS
  Filled 2014-10-11: qty 150

## 2014-10-11 MED ORDER — MORPHINE SULFATE 4 MG/ML IJ SOLN
4.0000 mg | Freq: Once | INTRAMUSCULAR | Status: AC
  Administered 2014-10-11: 4 mg via INTRAVENOUS
  Filled 2014-10-11: qty 1

## 2014-10-11 MED ORDER — MORPHINE SULFATE ER 15 MG PO TBCR
15.0000 mg | EXTENDED_RELEASE_TABLET | Freq: Two times a day (BID) | ORAL | Status: DC
  Administered 2014-10-11 – 2014-10-12 (×2): 15 mg via ORAL
  Filled 2014-10-11 (×2): qty 1

## 2014-10-11 MED ORDER — ACETAMINOPHEN 650 MG RE SUPP: 650.0000 mg | Freq: Four times a day (QID) | RECTAL | Status: DC | PRN

## 2014-10-11 MED ORDER — SERTRALINE HCL 25 MG PO TABS
25.0000 mg | ORAL_TABLET | Freq: Every day | ORAL | Status: DC
  Administered 2014-10-12: 25 mg via ORAL
  Filled 2014-10-11 (×2): qty 1

## 2014-10-11 MED ORDER — SODIUM CHLORIDE 0.9 % IV BOLUS (SEPSIS)
500.0000 mL | Freq: Once | INTRAVENOUS | Status: AC
  Administered 2014-10-11: 500 mL via INTRAVENOUS

## 2014-10-11 MED ORDER — SODIUM CHLORIDE 0.45 % IV SOLN
INTRAVENOUS | Status: DC
  Administered 2014-10-11: 22:00:00 via INTRAVENOUS

## 2014-10-11 MED ORDER — ACETAMINOPHEN 325 MG PO TABS: 650.0000 mg | ORAL_TABLET | Freq: Four times a day (QID) | ORAL | Status: DC | PRN

## 2014-10-11 MED ORDER — HYDROCODONE-ACETAMINOPHEN 5-325 MG PO TABS
1.0000 | ORAL_TABLET | ORAL | Status: DC | PRN
  Administered 2014-10-12: 1 via ORAL
  Filled 2014-10-11: qty 1

## 2014-10-11 MED ORDER — SODIUM CHLORIDE 0.9 % IV SOLN
1250.0000 mg | INTRAVENOUS | Status: DC
  Administered 2014-10-11: 1250 mg via INTRAVENOUS
  Filled 2014-10-11 (×2): qty 1250

## 2014-10-11 MED ORDER — ROSUVASTATIN CALCIUM 20 MG PO TABS
20.0000 mg | ORAL_TABLET | Freq: Every day | ORAL | Status: DC
  Administered 2014-10-12: 20 mg via ORAL
  Filled 2014-10-11: qty 1

## 2014-10-11 MED ORDER — ONDANSETRON HCL 4 MG/2ML IJ SOLN: 4.0000 mg | Freq: Four times a day (QID) | INTRAMUSCULAR | Status: DC | PRN

## 2014-10-11 MED ORDER — TORSEMIDE 20 MG PO TABS
60.0000 mg | ORAL_TABLET | Freq: Every day | ORAL | Status: DC
  Administered 2014-10-12: 60 mg via ORAL
  Filled 2014-10-11 (×2): qty 3

## 2014-10-11 MED ORDER — MORPHINE SULFATE 2 MG/ML IJ SOLN: 2.0000 mg | INTRAMUSCULAR | Status: DC | PRN

## 2014-10-11 MED ORDER — DONEPEZIL HCL 5 MG PO TABS
5.0000 mg | ORAL_TABLET | Freq: Every day | ORAL | Status: DC
  Administered 2014-10-11: 5 mg via ORAL
  Filled 2014-10-11 (×2): qty 1

## 2014-10-11 MED ORDER — ONDANSETRON HCL 4 MG PO TABS: 4.0000 mg | ORAL_TABLET | Freq: Four times a day (QID) | ORAL | Status: DC | PRN

## 2014-10-11 MED ORDER — MELATONIN 5 MG PO TABS: 1.0000 | ORAL_TABLET | Freq: Every day | ORAL | Status: DC

## 2014-10-11 MED ORDER — ASPIRIN EC 81 MG PO TBEC
81.0000 mg | DELAYED_RELEASE_TABLET | Freq: Every day | ORAL | Status: DC
  Administered 2014-10-12: 81 mg via ORAL
  Filled 2014-10-11: qty 1

## 2014-10-11 MED ORDER — TETANUS-DIPHTH-ACELL PERTUSSIS 5-2.5-18.5 LF-MCG/0.5 IM SUSP
0.5000 mL | Freq: Once | INTRAMUSCULAR | Status: AC
  Administered 2014-10-11: 0.5 mL via INTRAMUSCULAR
  Filled 2014-10-11: qty 0.5

## 2014-10-11 MED ORDER — DOCUSATE SODIUM 100 MG PO CAPS
100.0000 mg | ORAL_CAPSULE | Freq: Two times a day (BID) | ORAL | Status: DC
Start: 2014-10-11 — End: 2014-10-12
  Administered 2014-10-11 – 2014-10-12 (×2): 100 mg via ORAL
  Filled 2014-10-11 (×3): qty 1

## 2014-10-11 MED ORDER — ALUM & MAG HYDROXIDE-SIMETH 200-200-20 MG/5ML PO SUSP: 30.0000 mL | Freq: Four times a day (QID) | ORAL | Status: DC | PRN

## 2014-10-11 MED ORDER — HEPARIN SODIUM (PORCINE) 5000 UNIT/ML IJ SOLN
5000.0000 [IU] | Freq: Three times a day (TID) | INTRAMUSCULAR | Status: DC
  Administered 2014-10-11 – 2014-10-12 (×3): 5000 [IU] via SUBCUTANEOUS
  Filled 2014-10-11 (×5): qty 1

## 2014-10-11 MED ORDER — PANTOPRAZOLE SODIUM 40 MG PO TBEC
40.0000 mg | DELAYED_RELEASE_TABLET | Freq: Every day | ORAL | Status: DC
  Administered 2014-10-12: 40 mg via ORAL
  Filled 2014-10-11: qty 1

## 2014-10-11 MED ORDER — CETYLPYRIDINIUM CHLORIDE 0.05 % MT LIQD
7.0000 mL | Freq: Two times a day (BID) | OROMUCOSAL | Status: DC
  Administered 2014-10-12: 7 mL via OROMUCOSAL

## 2014-10-11 MED ORDER — METOPROLOL SUCCINATE ER 50 MG PO TB24
50.0000 mg | ORAL_TABLET | Freq: Every day | ORAL | Status: DC
  Administered 2014-10-12: 50 mg via ORAL
  Filled 2014-10-11: qty 1

## 2014-10-11 NOTE — H&P (Signed)
Patient Demographics  Billy Morgan, is a 81 y.o. male  MRN: [id number]   DOB - 05/03/1918  Admit Date - 10/11/2014  Outpatient Primary MD for the patient is NADEL,SCOTT M, MD   Assessment Billy Morgan is a pleasant 81 year old male with multiple medical problems, including chronic diastolic CHF, EF 56-38%/VFI status post CABG/peripheral vascular disease/essential hypertension/hyperlipidemia/hard of hearing/CKD stage III/Alzheimer's dementia who came in with an ulcer of the left foot with concern for cellulitis and osteomyelitis suggested by x-ray "1. There is a soft tissue ulcer along the medial aspect of the first MCP joint. There is soft tissue swelling concerning for cellulitis. 2. There are periarticular erosions involving the medial and lateral base of the first proximal phalanx and possibly the lateral aspect of the first metatarsal head as can be seen with crystalline arthropathy such as gout versus less likely infection". His white count is 10,900, BUN/creatinine 30/1.2 which is better than his baseline creatinine of 2.21 earlier this year. Lactic acid is 3. However, BP is holding in the 433 systolic. There is no report of traumatic injury to the foot although patient apparently complained of pain of the same area about 2 weeks ago around the same time he was apparently delirious, hence question of whether he may have bumped his foot on to something. Unfortunately, patient is not able to give a meaningful history because of dementia and also being hard of hearing, but he has a very supportive private nurse who is helping with the history. His nurse Gilmore Laroche reports that the ulcer started draining last night but had been closed all along. Patient is apparently on hospice but family would like for him to be treated as possible. Patient has been given tetanus toxoid/nafcillin and referred for admission. Orthopedics has been consulted by the ED and recommended medical admission in view of  multiple comorbidities. Will admit patient to MedSurg, pursue septic workup including UA/urine culture/blood culture(wound culture will be unreliable at this stage as area may be contaminated already)/CXR, check sedimentation rate/CRP, obtain MRI of the left foot to better evaluate extent of infection as this will determine treatment modalities including the length of antibiotics, cover with broad-spectrum antibiotics adjusted for creatinine clearance, adjust patient's pain medications as his nurse says pain not optimally controlled at home(MS Contin/Percocet/morphine IV as needed to make adjustments as needed) and also resume Toprol-XL in view of uncontrolled blood pressure(had been stopped because of low blood pressures). Consider resuming Imdur if BP remains uncontrolled but would not be overzealous in doing so given renal insufficiency and ongoing infection. Of note is that patient has chronic abdominal distension. He may need ID consultation for antibiotic management. Patient is DNR confirmed by his nurse. Plan Peripheral vascular disease/Cellulitis of left foot/Osteomyelitis of left foot  Admit MedSurg  Septic workup including UA/urine culture/blood culture/chest x-ray  Sedimentation rate/CRP/MRI left foot  Vancomycin/Levaquin adjusted for creatinine clearance per pharmacy  Gentle fluids  Orthopedics consultation  Patient may need ID consultation depending on preliminary studies.  Analgesics as tolerated by patient Hyperlipidemia/Essential hypertension/CAD, CABG X 5 '97. Low risk Myoview 1/14/Chronic congestive heart failure  Given Demadex 60 mg daily and monitor renal function.  Start Toprol-XL and consider Imdur depending on how his blood pressure response Chronic renal disease, stage III, his SCr was up to 2.75 in November 2013 and diuretics held/Dementia  No acute changes  Monitor DVT/GI Prophylaxis  Heparin subcutaneous  Protonix Family Communication: Admission, patients  condition and plan of care including tests  being ordered have been discussed with the patient and nurse Gilmore Laroche who indicate understanding and agree with the plan and Code Status.  Code Status   DNR/DNI  Likely DC  Home(has 24 hour care)  Condition GUARDED    Time spent in minutes : 15  Chief Complaint Chief Complaint  Patient presents with  . Wound Infection    L foot, base of first toe     HPI Billy Morgan  is a 81 y.o. male with hx of peripheral vascular disease, dementia, prostate cancer, venous insufficiency who presented to the ED for evaluation of Left foot pain.As patient cannot give a history, reviewed ED notes and spoke with patient's caregiver at the bedside. "Hx obtain through care giver and son who is at bedside. Per care giver a week ago pt was upset and accidentally kick his L foot against hard surface. Since then he has develop swelling and tenderness to medial aspect of his left foot. The swelling has got progressively worse with redness and last night it came to a head, oozing out thick white discharge. Leading up to last night his care giver has been soaking foot in water with baking soda and epson salt, along with OTC pain medication. Today caregiver contact pt's PCP in regard to the wound and was recommended to have pt be seen at URgent care for evaluation and potential I&D. At Urgent Care pt had imaging of his L foot which shows evidence of bone involvement concerning for osteo, therefore he was sent to ER for further care. At this time pt denies fever, increasing pain, abd cramping, n/v. Pt is hard of hearing. He is currently under hospice care although he lives at home. He is unable to ambulate, but can transfer from bed to wheel chair with assist. No hx of diabetes". Caregiver says she is not sure if patient hit his foot against an object. She says the foot has been painful for the past 2 weeks or so and started draining last night. The foot was inflamed and  red. There is no report of cough, dysuria, diarrhea or vomiting.   Review of Systems   As in the HPI above.   Past Medical History  Diagnosis Date  . Unspecified hearing loss   . HTN (hypertension)   . CAD (coronary artery disease)     a. 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 b. Low risk Myoview 1/14.  Marland Kitchen Peripheral vascular disease, unspecified   . Venous insufficiency   . Other and unspecified hyperlipidemia   . Esophageal reflux   . Irritable bowel syndrome   . Osteoarthrosis, unspecified whether generalized or localized, unspecified site   . Anxiety state, unspecified   . Malignant neoplasm of prostate   . Dementia   . Anasarca 09/09/2012  . Hx of echocardiogram 2013    EF 55%-65% with grade 1 diatolic dyfunction.  Marland Kitchen History of stress test 07/2012    normal perfusion without scar or ischemia.  . CKD (chronic kidney disease), stage III     a. Hx of more acute RF in 2013 - Cr 2.75. b. Baseline ~1.6.  . Chronic diastolic CHF (congestive heart failure)   . Prostate cancer   . Chronic edema       Past Surgical History  Procedure Laterality Date  . Inguinal hernia repair    . Appendectomy    . Coronary artery bypass graft  1997    CABG x5  . Cardiac catheterization  07/01/1996    progressive CAD; preserved  global LV function; coronary calcification involving teh LAD and RCA with progressive stnosis.   . Cardiac catheterization  07/19/1992    nl LV function; single vessel CAD involving the LAD with 40% proximal stenosis, 70% focal mid stenosis and 50% first diagonal stenosis    Social History History  Substance Use Topics  . Smoking status: Former Smoker    Quit date: 08/25/1954  . Smokeless tobacco: Never Used  . Alcohol Use: No    Family History Family History  Problem Relation Age of Onset  . Hypertension Mother   . Heart disease Father   . Colon cancer Neg Hx   . Heart attack Neg Hx   . Stroke Neg Hx   . Cancer Mother     Prior to Admission medications   Medication Sig Start  Date End Date Taking? Authorizing Provider  acetaminophen (TYLENOL) 325 MG tablet Take 650 mg by mouth every 6 (six) hours as needed for moderate pain (knee pain).   Yes Historical Provider, MD  aspirin 81 MG tablet Take 81 mg by mouth daily.     Yes Historical Provider, MD  donepezil (ARICEPT) 5 MG tablet take 1 tablet by mouth at bedtime 05/25/14  Yes Noralee Space, MD  FIBER SELECT GUMMIES PO Take 1 tablet by mouth daily as needed (hold for next day, if bowel movement is attained).    Yes Historical Provider, MD  Melatonin 5 MG TABS Take 1 tablet by mouth at bedtime.    Yes Historical Provider, MD  morphine (ROXANOL) 20 MG/ML concentrated solution 0.47ml to 1.83ml Q 4hrs for SOB or pain 09/04/14  Yes Brett Canales, PA-C  nitroGLYCERIN (NITROSTAT) 0.4 MG SL tablet Place 1 tablet (0.4 mg total) under the tongue every 5 (five) minutes as needed. For chest pain 05/26/14  Yes Troy Sine, MD  OXYGEN Inhale into the lungs See admin instructions. 2-3 liters daily   Yes Historical Provider, MD  rosuvastatin (CRESTOR) 20 MG tablet Take 1 tablet (20 mg total) by mouth daily. 05/06/14  Yes Troy Sine, MD  sertraline (ZOLOFT) 25 MG tablet take 1 tablet by mouth once daily Patient taking differently: Take 25 mg by mouth at bedtime. take 1 tablet by mouth once daily 05/25/14  Yes Noralee Space, MD  torsemide (DEMADEX) 20 MG tablet Take 5 tablets (100 mg total) by mouth daily. Patient taking differently: Take 20-100 mg by mouth daily as needed (for edema based on patientts weight).  07/02/14  Yes Thayer Headings, MD  isosorbide mononitrate (IMDUR) 60 MG 24 hr tablet 30 mg every morning and 60 mg every evening Patient not taking: Reported on 10/11/2014 05/06/14   Troy Sine, MD  metoprolol succinate (TOPROL-XL) 50 MG 24 hr tablet Take 1 tablet (50 mg total) by mouth daily. Patient not taking: Reported on 10/11/2014 12/11/13   Troy Sine, MD  potassium chloride SA (K-DUR,KLOR-CON) 20 MEQ tablet Take 1  tablet (20 mEq total) by mouth daily. Patient not taking: Reported on 10/11/2014 02/25/14   Troy Sine, MD    Allergies  Allergen Reactions  . Xanax [Alprazolam] Other (See Comments)    Severe agitation, very crazy erratic behaviors, psychosis, etc.   . Sulfamethoxazole Hives, Nausea And Vomiting and Rash    Physical Exam  Vitals  Blood pressure 154/84, pulse 71, temperature 97.8 F (36.6 C), temperature source Oral, resp. rate 25, SpO2 94 %.   1. General: lying in bed in NAD.  2. Normal  affect and insight, Not Suicidal or Homicidal, Awake Alert, Oriented and responding to questions with difficulty because of limited hearing  3. No F.N deficits, ALL C.Nerves Intact, Strength 5/5 all 4 extremities, Sensation intact all 4 extremities, Plantars down going.  4. Ears and Eyes appear Normal, Conjunctivae clear, PERRLA. Moist Oral Mucosa.  5. Supple Neck, No JVD, No cervical lymphadenopathy appriciated, No Carotid Bruits.  6. Symmetrical Chest wall movement, Good air movement bilaterally, CTAB.  7. RRR, No Gallops, Rubs or Murmurs, No Parasternal Heave.  8. Positive Bowel Sounds, Abdomen uniformly distended but Soft, Non tender, No organomegaly appreciated,No rebound -guarding or rigidity.  9.  Ulcer on medial aspect of left foot with erythema around the ulcer. No drainage.  10. Good muscle tone,  joints appear normal , no effusions, Normal ROM.  11. No Palpable Lymph Nodes in Neck or Axillae  Data Review CBC  Recent Labs Lab 10/11/14 1557 10/11/14 1602  WBC 10.9*  --   HGB 13.8 15.0  HCT 42.3 44.0  PLT 305  --   MCV 98.1  --   MCH 32.0  --   MCHC 32.6  --   RDW 13.8  --   LYMPHSABS 1.9  --   MONOABS 1.2*  --   EOSABS 0.2  --   BASOSABS 0.0  --    ------------------------------------------------------------------------------------------------------------------  Chemistries   Recent Labs Lab 10/11/14 1602  NA 137  K 4.5  CL 94*  GLUCOSE 99  BUN 30*    CREATININE 1.20   ------------------------------------------------------------------------------------------------------------------ CrCl cannot be calculated (Unknown ideal weight.). ------------------------------------------------------------------------------------------------------------------ No results for input(s): TSH, T4TOTAL, T3FREE, THYROIDAB in the last 72 hours.  Invalid input(s): FREET3   Coagulation profile No results for input(s): INR, PROTIME in the last 168 hours. ------------------------------------------------------------------------------------------------------------------- No results for input(s): DDIMER in the last 72 hours. -------------------------------------------------------------------------------------------------------------------  Cardiac Enzymes No results for input(s): CKMB, TROPONINI, MYOGLOBIN in the last 168 hours.  Invalid input(s): CK ------------------------------------------------------------------------------------------------------------------ Invalid input(s): POCBNP   ---------------------------------------------------------------------------------------------------------------  Urinalysis    Component Value Date/Time   COLORURINE YELLOW 03/22/2013 [place] 03/22/2013 1613   LABSPEC 1.008 03/22/2013 1613   PHURINE 7.5 03/22/2013 1613   GLUCOSEU NEGATIVE 03/22/2013 1613   GLUCOSEU NEGATIVE 11/07/2012 1728   HGBUR NEGATIVE 03/22/2013 1613   BILIRUBINUR NEGATIVE 03/22/2013 1613   KETONESUR NEGATIVE 03/22/2013 1613   PROTEINUR NEGATIVE 03/22/2013 1613   UROBILINOGEN 0.2 03/22/2013 1613   NITRITE NEGATIVE 03/22/2013 1613   LEUKOCYTESUR NEGATIVE 03/22/2013 1613    ----------------------------------------------------------------------------------------------------------------  Imaging results  Dg Foot Complete Left  10/11/2014   CLINICAL DATA:  Infection on the plantar aspect of the left the at the first MTP  joint.  EXAM: LEFT FOOT - COMPLETE 3+ VIEW  COMPARISON:  None.  FINDINGS: There is a soft tissue ulcer along the medial aspect of the first MCP joint. There is soft tissue swelling. There are punctate calcifications within the area soft tissue swelling which are likely dystrophic. There are periarticular erosions involving the medial and lateral base of the first proximal phalanx and possibly the lateral aspect of the first metatarsal head as can be seen with crystalline arthropathy such as gout. There is no periostitis. There is generalized osteopenia. There is no fracture or dislocation.  IMPRESSION: 1. There is a soft tissue ulcer along the medial aspect of the first MCP joint. There is soft tissue swelling concerning for cellulitis. 2. There are periarticular erosions involving the medial and lateral base of the first proximal  phalanx and possibly the lateral aspect of the first metatarsal head as can be seen with crystalline arthropathy such as gout versus less likely infection.   Electronically Signed   By: Kathreen Devoid   On: 10/11/2014 16:19        Jorian Willhoite M.D on 10/11/2014 at 7:18 PM  Between 7am to 7pm - Pager - [phone]  After 7pm go to [email or web address] - password [email or web address]  And look for the night coverage person covering me after hours  [facility] Office  [phone]

## 2014-10-11 NOTE — ED Notes (Signed)
Pt kicked "something" one week ago, injury and wound to L foot near big toe.  Pt's wound opened last night, caregiver reports "thick white" discharge from wound, pt seen at urgent care today.

## 2014-10-11 NOTE — ED Provider Notes (Signed)
CSN: [id number]     Arrival date & time 10/11/14  1442 History   First MD Initiated Contact with Patient 10/11/14 1509     Chief Complaint  Patient presents with  . Wound Infection    L foot, base of first toe     (Consider location/radiation/quality/duration/timing/severity/associated sxs/prior Treatment) HPI   81 year old male with hx of peripheral vascular disease, dementia, prostate cancer, venous insufficiency presents for evaluation of L foot pain.  Hx obtain through care giver and son who is at bedside.  Per care giver a week ago pt was upset and accidentally kick his L foot against hard surface.  Since then he has develop swelling and tenderness to medial aspect of his left foot.  The swelling has got progressively worse with redness and last night it came to a head, oozing out thick white discharge.  Leading up to last night his care giver has been soaking foot in water with baking soda and epson salt, along with OTC pain medication.  Today caregiver contact pt's PCP in regard to the wound and was recommended to have pt be seen at URgent care for evaluation and potential I&D.  At Urgent Care pt had imaging of his L foot which shows evidence of bone involvement concerning for osteo, therefore he was sent to ER for further care.  At this time pt denies fever, increasing pain, abd cramping, n/v.  Pt is hard of hearing.  He is currently under hospice care although he lives at home.  He is unable to ambulate, but can transfer from bed to wheel chair with assist.  No hx of diabetes.    Past Medical History  Diagnosis Date  . Unspecified hearing loss   . HTN (hypertension)   . CAD (coronary artery disease)     a. 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 b. Low risk Myoview 1/14.  Marland Kitchen Peripheral vascular disease, unspecified   . Venous insufficiency   . Other and unspecified hyperlipidemia   . Esophageal reflux   . Irritable bowel syndrome   . Osteoarthrosis, unspecified whether generalized or localized, unspecified  site   . Anxiety state, unspecified   . Malignant neoplasm of prostate   . Dementia   . Anasarca 09/09/2012  . Hx of echocardiogram 2013    EF 55%-65% with grade 1 diatolic dyfunction.  Marland Kitchen History of stress test 07/2012    normal perfusion without scar or ischemia.  . CKD (chronic kidney disease), stage III     a. Hx of more acute RF in 2013 - Cr 2.75. b. Baseline ~1.6.  . Chronic diastolic CHF (congestive heart failure)   . Prostate cancer   . Chronic edema    Past Surgical History  Procedure Laterality Date  . Inguinal hernia repair    . Appendectomy    . Coronary artery bypass graft  1997    CABG x5  . Cardiac catheterization  07/01/1996    progressive CAD; preserved global LV function; coronary calcification involving teh LAD and RCA with progressive stnosis.   . Cardiac catheterization  07/19/1992    nl LV function; single vessel CAD involving the LAD with 40% proximal stenosis, 70% focal mid stenosis and 50% first diagonal stenosis   Family History  Problem Relation Age of Onset  . Hypertension Mother   . Heart disease Father   . Colon cancer Neg Hx   . Heart attack Neg Hx   . Stroke Neg Hx   . Cancer Mother  History  Substance Use Topics  . Smoking status: Former Smoker    Quit date: 08/25/1954  . Smokeless tobacco: Never Used  . Alcohol Use: No    Review of Systems  Constitutional: Negative for fever.  Skin: Positive for rash and wound.  All other systems reviewed and are negative.     Allergies  Xanax and Sulfamethoxazole  Home Medications   Prior to Admission medications   Medication Sig Start Date End Date Taking? Authorizing Provider  acetaminophen (TYLENOL) 325 MG tablet Take 650 mg by mouth every 6 (six) hours as needed for moderate pain (knee pain).   Yes Historical Provider, MD  aspirin 81 MG tablet Take 81 mg by mouth daily.     Yes Historical Provider, MD  donepezil (ARICEPT) 5 MG tablet take 1 tablet by mouth at bedtime 05/25/14  Yes Noralee Space, MD  FIBER SELECT GUMMIES PO Take 1 tablet by mouth daily as needed (hold for next day, if bowel movement is attained).    Yes Historical Provider, MD  Melatonin 5 MG TABS Take 1 tablet by mouth at bedtime.    Yes Historical Provider, MD  morphine (ROXANOL) 20 MG/ML concentrated solution 0.38ml to 1.60ml Q 4hrs for SOB or pain 09/04/14  Yes Brett Canales, PA-C  nitroGLYCERIN (NITROSTAT) 0.4 MG SL tablet Place 1 tablet (0.4 mg total) under the tongue every 5 (five) minutes as needed. For chest pain 05/26/14  Yes Troy Sine, MD  OXYGEN Inhale into the lungs See admin instructions. 2-3 liters daily   Yes Historical Provider, MD  rosuvastatin (CRESTOR) 20 MG tablet Take 1 tablet (20 mg total) by mouth daily. 05/06/14  Yes Troy Sine, MD  sertraline (ZOLOFT) 25 MG tablet take 1 tablet by mouth once daily Patient taking differently: Take 25 mg by mouth at bedtime. take 1 tablet by mouth once daily 05/25/14  Yes Noralee Space, MD  torsemide (DEMADEX) 20 MG tablet Take 5 tablets (100 mg total) by mouth daily. Patient taking differently: Take 20-100 mg by mouth daily as needed (for edema based on patientts weight).  07/02/14  Yes Thayer Headings, MD  isosorbide mononitrate (IMDUR) 60 MG 24 hr tablet 30 mg every morning and 60 mg every evening Patient not taking: Reported on 10/11/2014 05/06/14   Troy Sine, MD  metoprolol succinate (TOPROL-XL) 50 MG 24 hr tablet Take 1 tablet (50 mg total) by mouth daily. Patient not taking: Reported on 10/11/2014 12/11/13   Troy Sine, MD  potassium chloride SA (K-DUR,KLOR-CON) 20 MEQ tablet Take 1 tablet (20 mEq total) by mouth daily. Patient not taking: Reported on 10/11/2014 02/25/14   Troy Sine, MD   BP 124/85 mmHg  Pulse 68  Temp(Src) 97.8 F (36.6 C) (Oral)  Resp 18  SpO2 100% Physical Exam  Constitutional:  Elderly caucasian male, appears uncomfortable, and hard of hearing.   HENT:  Mouth/Throat: Oropharynx is clear and moist.  Eyes:  Conjunctivae are normal.  Neck: Neck supple.  Cardiovascular: Normal rate, regular rhythm and intact distal pulses.   Pulmonary/Chest: Effort normal and breath sounds normal.  Faint rales at base of lung, poor effort  Skin: Rash (L foot: induration noted to medial aspect of first metartarsal with ulceration along with surrounding redness, ttp, no fluctuance noted.  ) noted.  Cap refill ~3sec to left great toe.    Nursing note and vitals reviewed.   ED Course  Procedures (including critical care time)  3:43  PM Pt injured L foot 1 weeks ago and now developing overlying skin cellulitis and suspect osteomyelitis.  Will obtain xray of L foot.  Work up initiated. Care discussed with Dr. Audie Pinto.    5:12 PM Dr. Audie Pinto recommend Nafcillin 2g.  I have also consulted with orthopedist Dr. Marcelino Scot who recommend medicine admission and check A1c.  He will see pt today or Dr. Sharol Given will see pt tomorrow morning.    5:50 PM Appreciated consult from [place], Dr. Sanjuana Letters who agrees to admit pt to med surg under his care.  Recommend tdap, will ordered.    Labs Review Labs Reviewed  CBC WITH DIFFERENTIAL/PLATELET - Abnormal; Notable for the following:    WBC 10.9 (*)    Monocytes Absolute 1.2 (*)    All other components within normal limits  SEDIMENTATION RATE - Abnormal; Notable for the following:    Sed Rate 104 (*)    All other components within normal limits  I-STAT CHEM 8, ED - Abnormal; Notable for the following:    Chloride 94 (*)    BUN 30 (*)    Calcium, Ion 1.11 (*)    All other components within normal limits  I-STAT CG4 LACTIC ACID, ED - Abnormal; Notable for the following:    Lactic Acid, Venous 3.00 (*)    All other components within normal limits  C-REACTIVE PROTEIN  HEMOGLOBIN A1C    Imaging Review Dg Foot Complete Left  10/11/2014   CLINICAL DATA:  Infection on the plantar aspect of the left the at the first MTP joint.  EXAM: LEFT FOOT - COMPLETE 3+ VIEW  COMPARISON:   None.  FINDINGS: There is a soft tissue ulcer along the medial aspect of the first MCP joint. There is soft tissue swelling. There are punctate calcifications within the area soft tissue swelling which are likely dystrophic. There are periarticular erosions involving the medial and lateral base of the first proximal phalanx and possibly the lateral aspect of the first metatarsal head as can be seen with crystalline arthropathy such as gout. There is no periostitis. There is generalized osteopenia. There is no fracture or dislocation.  IMPRESSION: 1. There is a soft tissue ulcer along the medial aspect of the first MCP joint. There is soft tissue swelling concerning for cellulitis. 2. There are periarticular erosions involving the medial and lateral base of the first proximal phalanx and possibly the lateral aspect of the first metatarsal head as can be seen with crystalline arthropathy such as gout versus less likely infection.   Electronically Signed   By: Kathreen Devoid   On: 10/11/2014 16:19     EKG Interpretation None      MDM   Final diagnoses:  Left foot infection    BP 154/84 mmHg  Pulse 71  Temp(Src) 97.8 F (36.6 C) (Oral)  Resp 25  SpO2 94%  I have reviewed nursing notes and vital signs. I personally reviewed the imaging tests through PACS system  I reviewed available ER/hospitalization records thought the EMR     Domenic Moras, PA-C 10/11/14 [place], MD 10/15/14 [id number]

## 2014-10-11 NOTE — Progress Notes (Signed)
ANTIBIOTIC CONSULT NOTE - INITIAL  Pharmacy Consult for Vancomycin  Indication: Concern for osteomyelitis of left foot, cellulitis  Allergies  Allergen Reactions  . Xanax [Alprazolam] Other (See Comments)    Severe agitation, very crazy erratic behaviors, psychosis, etc.   . Sulfamethoxazole Hives, Nausea And Vomiting and Rash    Patient Measurements: ~82 kg  Vital Signs: Temp: 97.8 F (36.6 C) (03/13 1456) Temp Source: Oral (03/13 1456) BP: 154/84 mmHg (03/13 1740) Pulse Rate: 71 (03/13 1740)  Labs:  Recent Labs  10/11/14 1557 10/11/14 1602  WBC 10.9*  --   HGB 13.8 15.0  PLT 305  --   CREATININE  --  1.20   CrCl cannot be calculated (Unknown ideal weight.). No results for input(s): VANCOTROUGH, VANCOPEAK, VANCORANDOM, GENTTROUGH, GENTPEAK, GENTRANDOM, TOBRATROUGH, TOBRAPEAK, TOBRARND, AMIKACINPEAK, AMIKACINTROU, AMIKACIN in the last 72 hours.   Microbiology: No results found for this or any previous visit (from the past 720 hour(s)).  Medical History: Past Medical History  Diagnosis Date  . Unspecified hearing loss   . HTN (hypertension)   . CAD (coronary artery disease)     a. 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 b. Low risk Myoview 1/14.  Marland Kitchen Peripheral vascular disease, unspecified   . Venous insufficiency   . Other and unspecified hyperlipidemia   . Esophageal reflux   . Irritable bowel syndrome   . Osteoarthrosis, unspecified whether generalized or localized, unspecified site   . Anxiety state, unspecified   . Malignant neoplasm of prostate   . Dementia   . Anasarca 09/09/2012  . Hx of echocardiogram 2013    EF 55%-65% with grade 1 diatolic dyfunction.  Marland Kitchen History of stress test 07/2012    normal perfusion without scar or ischemia.  . CKD (chronic kidney disease), stage III     a. Hx of more acute RF in 2013 - Cr 2.75. b. Baseline ~1.6.  . Chronic diastolic CHF (congestive heart failure)   . Prostate cancer   . Chronic edema    Assessment: 81 y/o M here with left foot  ulcer/cellulitis, imaging with MD concern for osteo, WBC 10.9, Scr 1.2, other labs as above.   Goal of Therapy:  Vancomycin trough level 15-20 mcg/ml  Plan:  -Vancomycin 1250 mg IV q24h -Levaquin per MD with 7 day stop date (renally adjusted) -Trend WBC, temp, renal function  -Drug levels as indicated   Narda Bonds 10/11/2014,8:39 PM

## 2014-10-11 NOTE — ED Notes (Signed)
PA made aware of patient CG4 lactic results.

## 2014-10-12 ENCOUNTER — Telehealth: Payer: Self-pay | Admitting: Cardiovascular Disease

## 2014-10-12 ENCOUNTER — Inpatient Hospital Stay (HOSPITAL_COMMUNITY): Payer: Medicare Other

## 2014-10-12 DIAGNOSIS — M109 Gout, unspecified: Secondary | ICD-10-CM | POA: Diagnosis present

## 2014-10-12 LAB — CBC WITH DIFFERENTIAL/PLATELET
Basophils Absolute: 0 10*3/uL (ref 0.0–0.1)
Basophils Relative: 0 % (ref 0–1)
EOS ABS: 0.2 10*3/uL (ref 0.0–0.7)
Eosinophils Relative: 3 % (ref 0–5)
HCT: 37.2 % — ABNORMAL LOW (ref 39.0–52.0)
Hemoglobin: 12.2 g/dL — ABNORMAL LOW (ref 13.0–17.0)
Lymphocytes Relative: 18 % (ref 12–46)
Lymphs Abs: 1.7 10*3/uL (ref 0.7–4.0)
MCH: 32.2 pg (ref 26.0–34.0)
MCHC: 32.8 g/dL (ref 30.0–36.0)
MCV: 98.2 fL (ref 78.0–100.0)
Monocytes Absolute: 1 10*3/uL (ref 0.1–1.0)
Monocytes Relative: 10 % (ref 3–12)
NEUTROS PCT: 69 % (ref 43–77)
Neutro Abs: 6.5 10*3/uL (ref 1.7–7.7)
PLATELETS: 265 10*3/uL (ref 150–400)
RBC: 3.79 MIL/uL — ABNORMAL LOW (ref 4.22–5.81)
RDW: 14.1 % (ref 11.5–15.5)
WBC: 9.4 10*3/uL (ref 4.0–10.5)

## 2014-10-12 LAB — PHOSPHORUS: PHOSPHORUS: 3.2 mg/dL (ref 2.3–4.6)

## 2014-10-12 LAB — COMPREHENSIVE METABOLIC PANEL
ALT: 17 U/L (ref 0–53)
AST: 25 U/L (ref 0–37)
Albumin: 2.9 g/dL — ABNORMAL LOW (ref 3.5–5.2)
Alkaline Phosphatase: 49 U/L (ref 39–117)
Anion gap: 8 (ref 5–15)
BUN: 21 mg/dL (ref 6–23)
CALCIUM: 8.6 mg/dL (ref 8.4–10.5)
CO2: 32 mmol/L (ref 19–32)
Chloride: 97 mmol/L (ref 96–112)
Creatinine, Ser: 1.34 mg/dL (ref 0.50–1.35)
GFR calc Af Amer: 51 mL/min — ABNORMAL LOW (ref >90)
GFR calc non Af Amer: 44 mL/min — ABNORMAL LOW (ref >90)
Glucose, Bld: 109 mg/dL — ABNORMAL HIGH (ref 70–99)
Potassium: 4.3 mmol/L (ref 3.5–5.1)
Sodium: 137 mmol/L (ref 135–145)
Total Bilirubin: 1.1 mg/dL (ref 0.3–1.2)
Total Protein: 6.2 g/dL (ref 6.0–8.3)

## 2014-10-12 LAB — SEDIMENTATION RATE: SED RATE: 64 mm/h — AB (ref 0–16)

## 2014-10-12 LAB — TSH: TSH: 2.407 u[IU]/mL (ref 0.350–4.500)

## 2014-10-12 LAB — URIC ACID: Uric Acid, Serum: 6.4 mg/dL (ref 4.0–7.8)

## 2014-10-12 LAB — PROTIME-INR
INR: 1.25 (ref 0.00–1.49)
PROTHROMBIN TIME: 15.9 s — AB (ref 11.6–15.2)

## 2014-10-12 LAB — APTT: aPTT: 32 seconds (ref 24–37)

## 2014-10-12 LAB — C-REACTIVE PROTEIN
CRP: 1.3 mg/dL — AB (ref <0.60)
CRP: 1.4 mg/dL — AB (ref <0.60)

## 2014-10-12 LAB — MAGNESIUM: Magnesium: 2 mg/dL (ref 1.5–2.5)

## 2014-10-12 MED ORDER — ALLOPURINOL 100 MG PO TABS
100.0000 mg | ORAL_TABLET | Freq: Two times a day (BID) | ORAL | Status: DC
  Administered 2014-10-12: 100 mg via ORAL
  Filled 2014-10-12 (×2): qty 1

## 2014-10-12 MED ORDER — SILVER SULFADIAZINE 1 % EX CREA: TOPICAL_CREAM | Freq: Every day | CUTANEOUS | Status: AC

## 2014-10-12 MED ORDER — DOXYCYCLINE HYCLATE 50 MG PO CAPS: 50.0000 mg | ORAL_CAPSULE | Freq: Two times a day (BID) | ORAL | Status: AC

## 2014-10-12 MED ORDER — ALLOPURINOL 100 MG PO TABS: 100.0000 mg | ORAL_TABLET | Freq: Every day | ORAL | Status: AC

## 2014-10-12 MED ORDER — COLCHICINE 0.6 MG PO TABS
0.6000 mg | ORAL_TABLET | Freq: Two times a day (BID) | ORAL | Status: DC
  Administered 2014-10-12: 0.6 mg via ORAL
  Filled 2014-10-12 (×2): qty 1

## 2014-10-12 MED ORDER — MORPHINE SULFATE ER 15 MG PO TBCR: 15.0000 mg | EXTENDED_RELEASE_TABLET | Freq: Two times a day (BID) | ORAL | Status: AC

## 2014-10-12 MED ORDER — SILVER SULFADIAZINE 1 % EX CREA
TOPICAL_CREAM | Freq: Every day | CUTANEOUS | Status: DC
  Administered 2014-10-12: 10:00:00 via TOPICAL
  Filled 2014-10-12: qty 85

## 2014-10-12 MED ORDER — GADOBENATE DIMEGLUMINE 529 MG/ML IV SOLN
20.0000 mL | Freq: Once | INTRAVENOUS | Status: AC | PRN
  Administered 2014-10-12: 16 mL via INTRAVENOUS

## 2014-10-12 MED ORDER — COLCHICINE 0.6 MG PO TABS: 0.6000 mg | ORAL_TABLET | Freq: Every day | ORAL | Status: AC

## 2014-10-12 NOTE — Progress Notes (Signed)
Nurse reviewed discharge instructions with pt and his caregivers.  Caregivers verbalized understanding of discharge instructions, follow up appointment and new medications.  Prescription given to caregiver prior to discharge.  No concerns at time of discharge.

## 2014-10-12 NOTE — Progress Notes (Signed)
PT Cancellation Note  Patient Details Name: Billy Morgan MRN: [id number] DOB: 02-Apr-1918   Cancelled Treatment:    Reason Eval/Treat Not Completed: PT screened, no needs identified, will sign off. Spoke with family who declines PT services. Family states pt is on hospice at home and that they will be able to manage his care when pt returns home. Will sign off. Thanks.    Weston Anna, MPT Pager: [phone]

## 2014-10-12 NOTE — Telephone Encounter (Signed)
Billy Morgan at [facility] back and informed her that Dr. Acie Fredrickson was out of the office today. Billy Morgan states that pt is staying in Hospice but is changing Hospice locations. Billy Morgan calling to make sure that Dr. Acie Fredrickson was still in agreement to be pt's attending physician. Billy Morgan also states that if Dr. Acie Fredrickson did agree to remain attending physician that they do have a program where they have physicians that can give orders or write prescriptions when Dr. Acie Fredrickson is unavailable. Informed Billy Morgan that I would route this information for Dr. Acie Fredrickson to review and advise on.

## 2014-10-12 NOTE — Care Management Note (Signed)
    Page 1 of 1   10/12/2014     10:38:11 AM CARE MANAGEMENT NOTE 10/12/2014  Patient:  Creveling,Suhaan P   Account Number:  [id number]  Date Initiated:  10/12/2014  Documentation initiated by:  Sunday Spillers  Subjective/Objective Assessment:   81 yo male admitted with possible sepsis, ulcer on LE. PTA lived at home with caregivers, active with hospice.     Action/Plan:   Home when stable   Anticipated DC Date:  10/14/2014   Anticipated DC Plan:  [place]  CM consult      [facility] Choice  Resumption Of Svcs/PTA Provider   Choice offered to / List presented to:             [place]   Status of service:  Completed, signed off Medicare Important Message given?   (If response is "NO", the following Medicare IM given date fields will be blank) Date Medicare IM given:   Medicare IM given by:   Date Additional Medicare IM given:   Additional Medicare IM given by:    Discharge Disposition:  [place]  Per UR Regulation:  Reviewed for med. necessity/level of care/duration of stay  If discussed at [place] of Stay Meetings, dates discussed:    Comments:

## 2014-10-12 NOTE — Telephone Encounter (Signed)
Does he have a primary medical doctor that would be willing to serve as the attending for hospice. I dont normally act as the attending when a patient is on the hospice service and did so in this case only because of the apparent urgency of the situation.  I would like to see if his primary medical doctor would assume this responsibility . I would be happy to talk to the hospice nurse or doctor if needed.

## 2014-10-12 NOTE — Discharge Summary (Signed)
Billy Morgan, is a 81 y.o. male  DOB [birth year]  MRN [id number].  Admission date:  10/11/2014  Admitting Physician  Nat Math, MD  Discharge Date:  10/12/2014   Primary MD  Noralee Space, MD  Recommendations for primary care physician for things to follow:  Please   Admission Diagnosis   Left foot infection [L08.9]   Discharge Diagnosis  Left foot infection [L08.9]   Active Problems:   Peripheral vascular disease   Cellulitis of left foot   Osteomyelitis of left foot   Hyperlipidemia   Essential hypertension   CAD, CABG X 5 '97. Low risk Myoview 1/14   Chronic congestive heart failure   Chronic renal disease, stage III, his SCr was up to 2.75 in November 2013 and diuretics held   Dementia   Gout of left foot      [place] is a pleasant 81 year old male with multiple medical problems, including chronic diastolic CHF, EF 62-95%/MWU status post CABG/peripheral vascular disease/essential hypertension/hyperlipidemia/hard of hearing/CKD stage III/Alzheimer's dementia/history of gout(?had attack 10 years ago according to his son), who came in with an ulcer of the left foot with concern for cellulitis and osteomyelitis suggested by x-ray "1. There is a soft tissue ulcer along the medial aspect of the first MCP joint. There is soft tissue swelling concerning for cellulitis. 2. There are periarticular erosions involving the medial and lateral base of the first proximal phalanx and possibly the lateral aspect of the first metatarsal head as can be seen with crystalline arthropathy such as gout versus less likely infection". His white count was 10,900, BUN/creatinine 30/1.2 which is better than his baseline creatinine of 2.21 earlier this year. Lactic acid was 3. However, BP was holding in the 132  systolic. There was no report of traumatic injury to the foot although patient apparently complained of pain of the same area about 2 weeks ago around the same time he was apparently delirious, hence question of whether he may have bumped his foot on to something. Unfortunately, patient is not able to give a meaningful history because of dementia and also being hard of hearing. MRI of the left foot showed "Osteomyelitis of the head of the first metatarsal and of the dorsal aspect of the base of the proximal phalanx of the great toe". Dr Sharol Given graciously saw patient in consult and felt that his clinical picture suggested more of gout than cellulitis/osteomyelitis. He requested uric acid level which was within normal limits, 6.4, and started patient on allopurinol/colchicine. I had discussions with Dr. Sharol Given and he felt patient could be managed for gout and possible osteomyelitis with oral antibiotics and he'll be happy to follow the patient in 2 weeks to follow on his progress. Will need to monitor renal function, if worsening consider giving steroids instead of allopurinol/colchicine for suspected gout. Will discharge patient on Doxycycline and defer to Dr Sharol Given for further management at next clinic visit. Of note is that septic workup including UA/urine culture/blood culture is unremarkable  to date. CXR showed "1. Mild enlargement of the cardiopericardial silhouette with interstitial accentuation favoring interstitial edema. Atypical pneumonia is a less likely differential diagnostic consideration. 2. Low lung volumes with mild bibasilar subsegmental atelectasis. 3. Calcifications along the left hemidiaphragm could be from remote asbestos exposure, or remote pleural inflammatory process. 4. Other findings include atherosclerosis and advanced right degenerative glenohumeral arthropathy". Sedimentation rate was 64 and CRP 1.4. Patient's primary caregivers felt that his pain is not optimally controlled at home. I have  added MS Contin, and he should continue immediate release morphine as needed, will defer to hospice for further adjustment as needed. His blood pressure was noted to be uncontrolled at the time of admission, hence Toprol-XL was resumed and BP seems better controlled now. Patient is discharged in relatively stable condition. He should follow with his PCP in the next 1-2 weeks.  Discharge Condition Stable.  Consults obtained  Orthopedics  Follow UP PCP Orthopedics-Dr. Sharol Given Follow-up Information    Follow up with DUDA,MARCUS V, MD In 2 weeks.   Specialty:  Orthopedic Surgery   Contact information:   [place]  [phone]         Discharge Instructions  and  Discharge Medications  Discharge Instructions    Diet - low sodium heart healthy    Complete by:  As directed      Discharge wound care:    Complete by:  As directed   Per Dr Sharol Given     Increase activity slowly    Complete by:  As directed             Medication List    STOP taking these medications        isosorbide mononitrate 60 MG 24 hr tablet  Commonly known as:  IMDUR      TAKE these medications        acetaminophen 325 MG tablet  Commonly known as:  TYLENOL  Take 650 mg by mouth every 6 (six) hours as needed for moderate pain (knee pain).     allopurinol 100 MG tablet  Commonly known as:  ZYLOPRIM  Take 1 tablet (100 mg total) by mouth daily.     aspirin 81 MG tablet  Take 81 mg by mouth daily.     colchicine 0.6 MG tablet  Take 1 tablet (0.6 mg total) by mouth daily.     donepezil 5 MG tablet  Commonly known as:  ARICEPT  take 1 tablet by mouth at bedtime     doxycycline 50 MG capsule  Commonly known as:  VIBRAMYCIN  Take 1 capsule (50 mg total) by mouth 2 (two) times daily.     FIBER SELECT GUMMIES PO  Take 1 tablet by mouth daily as needed (hold for next day, if bowel movement is attained).     Melatonin 5 MG Tabs  Take 1 tablet by mouth at bedtime.      metoprolol succinate 50 MG 24 hr tablet  Commonly known as:  TOPROL-XL  Take 1 tablet (50 mg total) by mouth daily.     morphine 20 MG/ML concentrated solution  Commonly known as:  ROXANOL  0.10ml to 1.57ml Q 4hrs for SOB or pain     morphine 15 MG 12 hr tablet  Commonly known as:  MS CONTIN  Take 1 tablet (15 mg total) by mouth every 12 (twelve) hours.     nitroGLYCERIN 0.4 MG SL tablet  Commonly known as:  NITROSTAT  Place 1  tablet (0.4 mg total) under the tongue every 5 (five) minutes as needed. For chest pain     OXYGEN  Inhale into the lungs See admin instructions. 2-3 liters daily     potassium chloride SA 20 MEQ tablet  Commonly known as:  K-DUR,KLOR-CON  Take 1 tablet (20 mEq total) by mouth daily.     rosuvastatin 20 MG tablet  Commonly known as:  CRESTOR  Take 1 tablet (20 mg total) by mouth daily.     sertraline 25 MG tablet  Commonly known as:  ZOLOFT  take 1 tablet by mouth once daily     silver sulfADIAZINE 1 % cream  Commonly known as:  SILVADENE  Apply topically daily.     torsemide 20 MG tablet  Commonly known as:  DEMADEX  Take 5 tablets (100 mg total) by mouth daily.        Diet and Activity recommendation: See Discharge Instructions above  Major procedures and Radiology Reports - PLEASE review detailed and final reports for all details, in brief -    Mr Foot Left W Wo Contrast  10/12/2014   CLINICAL DATA:  Cellulitis and soft tissue ulceration overlying the head of the first metatarsal. Osseous erosions.  EXAM: MRI OF THE LEFT FOREFOOT WITHOUT AND WITH CONTRAST  TECHNIQUE: Multiplanar, multisequence MR imaging was performed both before and after administration of intravenous contrast.  CONTRAST:  14mL MULTIHANCE GADOBENATE DIMEGLUMINE 529 MG/ML IV SOLN  COMPARISON:  Radiographs dated 10/11/2014  FINDINGS: There is focal bone destruction of the dorsal aspect of the base of the proximal phalanx of the great toe. There is also a small focal area of bone  destruction dorsal medial aspect of the head of the first metatarsal. This is immediately deep to the soft tissue ulceration which extends to the bone. Was a small amount of air in the first metatarsal phalangeal joint.  The other bones of the forefoot appear normal. There are no soft tissue abscesses. Adjacent tendons and ligaments appear normal. The sesamoids at the head of the first metatarsal are normal.  IMPRESSION: Osteomyelitis of the head of the first metatarsal and of the dorsal aspect of the base of the proximal phalanx of the great toe.   Electronically Signed   By: Lorriane Shire M.D.   On: 10/12/2014 08:24   Portable Chest 1 View  10/11/2014   CLINICAL DATA:  Osteomyelitis. Concern for lung infection. Hypertension. Coronary artery disease. Prostate cancer.  EXAM: PORTABLE CHEST - 1 VIEW  COMPARISON:  08/25/2014  FINDINGS: The patient is rotated to the right on today's radiograph, reducing diagnostic sensitivity and specificity. Prior CABG. Mild enlargement of the cardiopericardial silhouette. Interstitial accentuation is observed bilaterally, and increased from 08/25/2014. No overt airspace edema is noted.  There calcifications along the left hemidiaphragm. Linear opacities at both lung bases favors subsegmental atelectasis. Low lung volumes are present. Tortuous thoracic aorta. Advanced degenerative glenohumeral arthropathy on the right.  Thoracic spondylosis is noted.  IMPRESSION: 1. Mild enlargement of the cardiopericardial silhouette with interstitial accentuation favoring interstitial edema. Atypical pneumonia is a less likely differential diagnostic consideration. 2. Low lung volumes with mild bibasilar subsegmental atelectasis. 3. Calcifications along the left hemidiaphragm could be from remote asbestos exposure, or remote pleural inflammatory process. 4. Other findings include atherosclerosis and advanced right degenerative glenohumeral arthropathy.   Electronically Signed   By: Van Clines M.D.   On: 10/11/2014 21:05   Dg Foot Complete Left  10/11/2014   CLINICAL DATA:  Infection on the plantar aspect of the left the at the first MTP joint.  EXAM: LEFT FOOT - COMPLETE 3+ VIEW  COMPARISON:  None.  FINDINGS: There is a soft tissue ulcer along the medial aspect of the first MCP joint. There is soft tissue swelling. There are punctate calcifications within the area soft tissue swelling which are likely dystrophic. There are periarticular erosions involving the medial and lateral base of the first proximal phalanx and possibly the lateral aspect of the first metatarsal head as can be seen with crystalline arthropathy such as gout. There is no periostitis. There is generalized osteopenia. There is no fracture or dislocation.  IMPRESSION: 1. There is a soft tissue ulcer along the medial aspect of the first MCP joint. There is soft tissue swelling concerning for cellulitis. 2. There are periarticular erosions involving the medial and lateral base of the first proximal phalanx and possibly the lateral aspect of the first metatarsal head as can be seen with crystalline arthropathy such as gout versus less likely infection.   Electronically Signed   By: Kathreen Devoid   On: 10/11/2014 16:19    Micro Results   No results found for this or any previous visit (from the past 240 hour(s)).     Today   Subjective:   Billy Morgan today has no headache,no chest abdominal pain,no new weakness tingling or numbness, feels much better wants to go home today.   Objective:   Blood pressure 128/79, pulse 73, temperature 97.8 F (36.6 C), temperature source Oral, resp. rate 20, height 6' (1.829 m), weight 81.285 kg (179 lb 3.2 oz), SpO2 99 %.   Intake/Output Summary (Last 24 hours) at 10/12/14 1503 Last data filed at 10/12/14 1400  Gross per 24 hour  Intake 1587.33 ml  Output      0 ml  Net 1587.33 ml    Exam Awake Alert, Oriented in person and place, No new F.N deficits, Normal  affect [place].AT,PERRAL Supple Neck,No JVD, No cervical lymphadenopathy appriciated.  Symmetrical Chest wall movement, Good air movement bilaterally, CTAB RRR,No Gallops,Rubs or new Murmurs, No Parasternal Heave +ve B.Sounds, Abd Soft, Non tender, No organomegaly appriciated, No rebound -guarding or rigidity. No Cyanosis, Clubbing or edema, No new Rash or bruise, left foot ulcer is dry.  Data Review   CBC w Diff: Lab Results  Component Value Date   WBC 9.4 10/12/2014   HGB 12.2* 10/12/2014   HCT 37.2* 10/12/2014   PLT 265 10/12/2014   LYMPHOPCT 18 10/12/2014   MONOPCT 10 10/12/2014   EOSPCT 3 10/12/2014   BASOPCT 0 10/12/2014    CMP: Lab Results  Component Value Date   NA 137 10/12/2014   K 4.3 10/12/2014   CL 97 10/12/2014   CO2 32 10/12/2014   BUN 21 10/12/2014   CREATININE 1.34 10/12/2014   CREATININE 1.71* 03/18/2014   PROT 6.2 10/12/2014   ALBUMIN 2.9* 10/12/2014   BILITOT 1.1 10/12/2014   ALKPHOS 49 10/12/2014   AST 25 10/12/2014   ALT 17 10/12/2014  .   Total Time in preparing paper work, data evaluation and todays exam - 35 minutes  Sruti Ayllon M.D on 10/12/2014 at 3:03 PM  [facility] Office  [phone]

## 2014-10-12 NOTE — Consult Note (Signed)
Reason for Consult: Ulceration possible osteomyelitis left great toe Referring Physician: Dr. Basil Dess Billy Morgan is an 81 y.o. male.  HPI: Patient is a 81 year old gentleman with peripheral vascular disease coronary artery disease and hypertension who presents with ulceration left great toe first metatarsal head  Past Medical History  Diagnosis Date  . Unspecified hearing loss   . HTN (hypertension)   . CAD (coronary artery disease)     a. 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 b. Low risk Myoview 1/14.  Marland Kitchen Peripheral vascular disease, unspecified   . Venous insufficiency   . Other and unspecified hyperlipidemia   . Esophageal reflux   . Irritable bowel syndrome   . Osteoarthrosis, unspecified whether generalized or localized, unspecified site   . Anxiety state, unspecified   . Malignant neoplasm of prostate   . Dementia   . Anasarca 09/09/2012  . Hx of echocardiogram 2013    EF 55%-65% with grade 1 diatolic dyfunction.  Marland Kitchen History of stress test 07/2012    normal perfusion without scar or ischemia.  . CKD (chronic kidney disease), stage III     a. Hx of more acute RF in 2013 - Cr 2.75. b. Baseline ~1.6.  . Chronic diastolic CHF (congestive heart failure)   . Prostate cancer   . Chronic edema     Past Surgical History  Procedure Laterality Date  . Inguinal hernia repair    . Appendectomy    . Coronary artery bypass graft  1997    CABG x5  . Cardiac catheterization  07/01/1996    progressive CAD; preserved global LV function; coronary calcification involving teh LAD and RCA with progressive stnosis.   . Cardiac catheterization  07/19/1992    nl LV function; single vessel CAD involving the LAD with 40% proximal stenosis, 70% focal mid stenosis and 50% first diagonal stenosis    Family History  Problem Relation Age of Onset  . Hypertension Mother   . Heart disease Father   . Colon cancer Neg Hx   . Heart attack Neg Hx   . Stroke Neg Hx   . Cancer Mother     Social History:  reports that he  quit smoking about 60 years ago. He has never used smokeless tobacco. He reports that he does not drink alcohol or use illicit drugs.  Allergies:  Allergies  Allergen Reactions  . Xanax [Alprazolam] Other (See Comments)    Severe agitation, very crazy erratic behaviors, psychosis, etc.   . Sulfamethoxazole Hives, Nausea And Vomiting and Rash    Medications: I have reviewed the patient's current medications.  Results for orders placed or performed during the hospital encounter of 10/11/14 (from the past 48 hour(s))  CBC with Differential/Platelet     Status: Abnormal   Collection Time: 10/11/14  3:57 PM  Result Value Ref Range   WBC 10.9 (H) 4.0 - 10.5 K/uL   RBC 4.31 4.22 - 5.81 MIL/uL   Hemoglobin 13.8 13.0 - 17.0 g/dL   HCT 42.3 39.0 - 52.0 %   MCV 98.1 78.0 - 100.0 fL   MCH 32.0 26.0 - 34.0 pg   MCHC 32.6 30.0 - 36.0 g/dL   RDW 13.8 11.5 - 15.5 %   Platelets 305 150 - 400 K/uL   Neutrophils Relative % 69 43 - 77 %   Neutro Abs 7.6 1.7 - 7.7 K/uL   Lymphocytes Relative 18 12 - 46 %   Lymphs Abs 1.9 0.7 - 4.0 K/uL   Monocytes Relative 11  3 - 12 %   Monocytes Absolute 1.2 (H) 0.1 - 1.0 K/uL   Eosinophils Relative 2 0 - 5 %   Eosinophils Absolute 0.2 0.0 - 0.7 K/uL   Basophils Relative 0 0 - 1 %   Basophils Absolute 0.0 0.0 - 0.1 K/uL  Sedimentation rate     Status: Abnormal   Collection Time: 10/11/14  3:57 PM  Result Value Ref Range   Sed Rate 104 (H) 0 - 16 mm/hr  C-reactive protein     Status: Abnormal   Collection Time: 10/11/14  3:57 PM  Result Value Ref Range   CRP 1.3 (H) <0.60 mg/dL    Comment: Performed at [facility]  Creatinine, serum     Status: Abnormal   Collection Time: 10/11/14  3:57 PM  Result Value Ref Range   Creatinine, Ser 1.32 0.50 - 1.35 mg/dL   GFR calc non Af Amer 44 (L) >90 mL/min   GFR calc Af Amer 52 (L) >90 mL/min    Comment: (NOTE) The eGFR has been calculated using the CKD EPI equation. This calculation has not been validated  in all clinical situations. eGFR's persistently <90 mL/min signify possible Chronic Kidney Disease.   I-stat chem 8, ed     Status: Abnormal   Collection Time: 10/11/14  4:02 PM  Result Value Ref Range   Sodium 137 135 - 145 mmol/L   Potassium 4.5 3.5 - 5.1 mmol/L   Chloride 94 (L) 96 - 112 mmol/L   BUN 30 (H) 6 - 23 mg/dL   Creatinine, Ser 1.20 0.50 - 1.35 mg/dL   Glucose, Bld 99 70 - 99 mg/dL   Calcium, Ion 1.11 (L) 1.13 - 1.30 mmol/L   TCO2 31 0 - 100 mmol/L   Hemoglobin 15.0 13.0 - 17.0 g/dL   HCT 44.0 39.0 - 52.0 %  I-Stat CG4 Lactic Acid, ED     Status: Abnormal   Collection Time: 10/11/14  4:03 PM  Result Value Ref Range   Lactic Acid, Venous 3.00 (HH) 0.5 - 2.0 mmol/L   Comment NOTIFIED PHYSICIAN   Urinalysis, Routine w reflex microscopic     Status: None   Collection Time: 10/11/14 10:00 PM  Result Value Ref Range   Color, Urine YELLOW YELLOW   APPearance CLEAR CLEAR   Specific Gravity, Urine 1.014 1.005 - 1.030   pH 8.0 5.0 - 8.0   Glucose, UA NEGATIVE NEGATIVE mg/dL   Hgb urine dipstick NEGATIVE NEGATIVE   Bilirubin Urine NEGATIVE NEGATIVE   Ketones, ur NEGATIVE NEGATIVE mg/dL   Protein, ur NEGATIVE NEGATIVE mg/dL   Urobilinogen, UA 1.0 0.0 - 1.0 mg/dL   Nitrite NEGATIVE NEGATIVE   Leukocytes, UA NEGATIVE NEGATIVE    Comment: MICROSCOPIC NOT DONE ON URINES WITH NEGATIVE PROTEIN, BLOOD, LEUKOCYTES, NITRITE, OR GLUCOSE <1000 mg/dL.  Comprehensive metabolic panel     Status: Abnormal   Collection Time: 10/12/14  5:30 AM  Result Value Ref Range   Sodium 137 135 - 145 mmol/L   Potassium 4.3 3.5 - 5.1 mmol/L   Chloride 97 96 - 112 mmol/L   CO2 32 19 - 32 mmol/L   Glucose, Bld 109 (H) 70 - 99 mg/dL   BUN 21 6 - 23 mg/dL   Creatinine, Ser 1.34 0.50 - 1.35 mg/dL   Calcium 8.6 8.4 - 10.5 mg/dL   Total Protein 6.2 6.0 - 8.3 g/dL   Albumin 2.9 (L) 3.5 - 5.2 g/dL   AST 25 0 - 37 U/L  ALT 17 0 - 53 U/L   Alkaline Phosphatase 49 39 - 117 U/L   Total Bilirubin 1.1  0.3 - 1.2 mg/dL   GFR calc non Af Amer 44 (L) >90 mL/min   GFR calc Af Amer 51 (L) >90 mL/min    Comment: (NOTE) The eGFR has been calculated using the CKD EPI equation. This calculation has not been validated in all clinical situations. eGFR's persistently <90 mL/min signify possible Chronic Kidney Disease.    Anion gap 8 5 - 15  Magnesium     Status: None   Collection Time: 10/12/14  5:30 AM  Result Value Ref Range   Magnesium 2.0 1.5 - 2.5 mg/dL  Phosphorus     Status: None   Collection Time: 10/12/14  5:30 AM  Result Value Ref Range   Phosphorus 3.2 2.3 - 4.6 mg/dL  CBC WITH DIFFERENTIAL     Status: Abnormal   Collection Time: 10/12/14  5:30 AM  Result Value Ref Range   WBC 9.4 4.0 - 10.5 K/uL   RBC 3.79 (L) 4.22 - 5.81 MIL/uL   Hemoglobin 12.2 (L) 13.0 - 17.0 g/dL    Comment: DELTA CHECK NOTED REPEATED TO VERIFY    HCT 37.2 (L) 39.0 - 52.0 %   MCV 98.2 78.0 - 100.0 fL   MCH 32.2 26.0 - 34.0 pg   MCHC 32.8 30.0 - 36.0 g/dL   RDW 14.1 11.5 - 15.5 %   Platelets 265 150 - 400 K/uL   Neutrophils Relative % 69 43 - 77 %   Neutro Abs 6.5 1.7 - 7.7 K/uL   Lymphocytes Relative 18 12 - 46 %   Lymphs Abs 1.7 0.7 - 4.0 K/uL   Monocytes Relative 10 3 - 12 %   Monocytes Absolute 1.0 0.1 - 1.0 K/uL   Eosinophils Relative 3 0 - 5 %   Eosinophils Absolute 0.2 0.0 - 0.7 K/uL   Basophils Relative 0 0 - 1 %   Basophils Absolute 0.0 0.0 - 0.1 K/uL  APTT     Status: None   Collection Time: 10/12/14  5:30 AM  Result Value Ref Range   aPTT 32 24 - 37 seconds  Protime-INR     Status: Abnormal   Collection Time: 10/12/14  5:30 AM  Result Value Ref Range   Prothrombin Time 15.9 (H) 11.6 - 15.2 seconds   INR 1.25 0.00 - 1.49  TSH     Status: None   Collection Time: 10/12/14  5:30 AM  Result Value Ref Range   TSH 2.407 0.350 - 4.500 uIU/mL    Portable Chest 1 View  10/11/2014   CLINICAL DATA:  Osteomyelitis. Concern for lung infection. Hypertension. Coronary artery disease.  Prostate cancer.  EXAM: PORTABLE CHEST - 1 VIEW  COMPARISON:  08/25/2014  FINDINGS: The patient is rotated to the right on today's radiograph, reducing diagnostic sensitivity and specificity. Prior CABG. Mild enlargement of the cardiopericardial silhouette. Interstitial accentuation is observed bilaterally, and increased from 08/25/2014. No overt airspace edema is noted.  There calcifications along the left hemidiaphragm. Linear opacities at both lung bases favors subsegmental atelectasis. Low lung volumes are present. Tortuous thoracic aorta. Advanced degenerative glenohumeral arthropathy on the right.  Thoracic spondylosis is noted.  IMPRESSION: 1. Mild enlargement of the cardiopericardial silhouette with interstitial accentuation favoring interstitial edema. Atypical pneumonia is a less likely differential diagnostic consideration. 2. Low lung volumes with mild bibasilar subsegmental atelectasis. 3. Calcifications along the left hemidiaphragm could be from remote asbestos  exposure, or remote pleural inflammatory process. 4. Other findings include atherosclerosis and advanced right degenerative glenohumeral arthropathy.   Electronically Signed   By: Van Clines M.D.   On: 10/11/2014 21:05   Dg Foot Complete Left  10/11/2014   CLINICAL DATA:  Infection on the plantar aspect of the left the at the first MTP joint.  EXAM: LEFT FOOT - COMPLETE 3+ VIEW  COMPARISON:  None.  FINDINGS: There is a soft tissue ulcer along the medial aspect of the first MCP joint. There is soft tissue swelling. There are punctate calcifications within the area soft tissue swelling which are likely dystrophic. There are periarticular erosions involving the medial and lateral base of the first proximal phalanx and possibly the lateral aspect of the first metatarsal head as can be seen with crystalline arthropathy such as gout. There is no periostitis. There is generalized osteopenia. There is no fracture or dislocation.  IMPRESSION:  1. There is a soft tissue ulcer along the medial aspect of the first MCP joint. There is soft tissue swelling concerning for cellulitis. 2. There are periarticular erosions involving the medial and lateral base of the first proximal phalanx and possibly the lateral aspect of the first metatarsal head as can be seen with crystalline arthropathy such as gout versus less likely infection.   Electronically Signed   By: Kathreen Devoid   On: 10/11/2014 16:19    Review of Systems  All other systems reviewed and are negative.  Blood pressure 117/65, pulse 65, temperature 97.6 F (36.4 C), temperature source Oral, resp. rate 20, height 6' (1.829 m), weight 81.285 kg (179 lb 3.2 oz), SpO2 100 %. Physical Exam On examination the left foot patient has a good posterior tibial pulse he has a weak dorsalis pedis pulse. Examination of the great toe MTP joint he has tophaceous changes consistent with chronic gout. There is a slight amount of redness around the ulcer which is about 1 cm in diameter.  Review of the radiographs shows periarticular cystic changes consistent with chronic gout of the MTP joint left great toe.  Review of the MRI scan shows enhancement of the MTP joint but does not show increased enhancement after contrast. Assessment/Plan: Assessment: Chronic gout left great toe MTP joint.  Plan: I will order a uric acid level and start the patient on colchicine and allopurinol medication. Would recommend discharging the patient on gout medication, oral antibiotics and I will follow-up in the office in 2 weeks.  Lamiracle Chaidez V 10/12/2014, 7:24 AM

## 2014-10-12 NOTE — Telephone Encounter (Signed)
Attempted to call Jocelyn Lamer from [facility] back. Recording came on stating the number listed, [phone], is only answered until 5pm. Will forward to Dr. Elmarie Shiley nurse, Sharyn Lull, for review and follow up.

## 2014-10-12 NOTE — Telephone Encounter (Signed)
New problem   Billy Morgan need to know if Dr Acie Fredrickson is going to continue to be the attending physician for hospice. Please advise, they can receive a verbal order.

## 2014-10-13 ENCOUNTER — Telehealth: Payer: Self-pay | Admitting: Pulmonary Disease

## 2014-10-13 LAB — HEMOGLOBIN A1C
Hgb A1c MFr Bld: 6.5 % — ABNORMAL HIGH (ref 4.8–5.6)
Mean Plasma Glucose: 140 mg/dL

## 2014-10-13 LAB — URINE CULTURE: Colony Count: 60000

## 2014-10-13 NOTE — Telephone Encounter (Signed)
Spoke with Jocelyn Lamer who states she reviewed patient's chart and is aware of Dr. Elmarie Shiley desire to turn over hospice admission to primary care.  Jocelyn Lamer states per family patient was dismissed from [facility] and is now a patient with [facility].  I thanked Jocelyn Lamer for her help.

## 2014-10-13 NOTE — Telephone Encounter (Signed)
Spoke with SN. He will pt's attending. Gave verbal to [place].

## 2014-10-14 ENCOUNTER — Other Ambulatory Visit: Payer: Self-pay | Admitting: Cardiovascular Disease

## 2014-10-14 ENCOUNTER — Telehealth: Payer: Self-pay | Admitting: Pulmonary Disease

## 2014-10-14 MED ORDER — POTASSIUM CHLORIDE CRYS ER 20 MEQ PO TBCR: 20.0000 meq | EXTENDED_RELEASE_TABLET | Freq: Every day | ORAL | Status: AC

## 2014-10-14 MED ORDER — METOPROLOL SUCCINATE ER 50 MG PO TB24: 50.0000 mg | ORAL_TABLET | Freq: Every day | ORAL | Status: AC

## 2014-10-14 NOTE — Telephone Encounter (Signed)
°  1. Which medications need to be refilled?Mtoprolol and Potassium 2 Which pharmacy is medication to be sent to?[phone]  3. Do they need a 30 day or 90 day supply? 30 and refills  4. Would they like a call back once the medication has been sent to the pharmacy? yes

## 2014-10-14 NOTE — Telephone Encounter (Signed)
Rx(s) sent to pharmacy electronically.  

## 2014-10-14 NOTE — Telephone Encounter (Signed)
Spoke with Kurel at [facility] Advised her that SN does not prescribed these medications. Dr. Claiborne Billings will need to be the one to do this. Nothing further was needed.

## 2014-10-16 ENCOUNTER — Telehealth: Payer: Self-pay | Admitting: Pulmonary Disease

## 2014-10-16 NOTE — Telephone Encounter (Signed)
Per SN---  Called and spoke with hospice RN.  She is aware that ok to use laxatives as needed.  No other medication changes.  They will need to follow up with Dr. Acie Fredrickson for all of the CHF issues.  She is aware and nothing further is needed.

## 2014-10-16 NOTE — Telephone Encounter (Signed)
Called and spoke to Billy Morgan. Nurse stated pt's CHF is worsening. Pt has course crackles BIL lower lobes. Pt receiving morphine for SOB, and Morgan is having to give up to 100mg  of Demadex for fluid retention. Pt cannot take 100mg  for an extended period of time or pt will become confused. Pt put out 2300 ml of urine over 8-12 hours. Morgan giving this info to SN as FYI and also if SN has any additional orders. Also Morgan is requesting VO for laxatives prn, either Sennakot or Dulcolax.   SN please advise.   Allergies  Allergen Reactions  . Xanax [Alprazolam] Other (See Comments)    Severe agitation, very crazy erratic behaviors, psychosis, etc.   . Sulfamethoxazole Hives, Nausea And Vomiting and Rash    Current Outpatient Prescriptions on File Prior to Visit  Medication Sig Dispense Refill  . acetaminophen (TYLENOL) 325 MG tablet Take 650 mg by mouth every 6 (six) hours as needed for moderate pain (knee pain).    Marland Kitchen allopurinol (ZYLOPRIM) 100 MG tablet Take 1 tablet (100 mg total) by mouth daily. 30 tablet 0  . aspirin 81 MG tablet Take 81 mg by mouth daily.      . colchicine 0.6 MG tablet Take 1 tablet (0.6 mg total) by mouth daily. 30 tablet 0  . donepezil (ARICEPT) 5 MG tablet take 1 tablet by mouth at bedtime 90 tablet 3  . doxycycline (VIBRAMYCIN) 50 MG capsule Take 1 capsule (50 mg total) by mouth 2 (two) times daily. 60 capsule 0  . FIBER SELECT GUMMIES PO Take 1 tablet by mouth daily as needed (hold for next day, if bowel movement is attained).     . Melatonin 5 MG TABS Take 1 tablet by mouth at bedtime.     . metoprolol succinate (TOPROL-XL) 50 MG 24 hr tablet Take 1 tablet (50 mg total) by mouth daily. 30 tablet 10  . morphine (MS CONTIN) 15 MG 12 hr tablet Take 1 tablet (15 mg total) by mouth every 12 (twelve) hours. 60 tablet 0  . morphine (ROXANOL) 20 MG/ML concentrated solution 0.22ml to 1.90ml Q 4hrs for SOB or pain 30 mL 0  . nitroGLYCERIN (NITROSTAT) 0.4 MG SL tablet Place 1 tablet (0.4  mg total) under the tongue every 5 (five) minutes as needed. For chest pain 180 tablet 1  . OXYGEN Inhale into the lungs See admin instructions. 2-3 liters daily    . potassium chloride SA (K-DUR,KLOR-CON) 20 MEQ tablet Take 1 tablet (20 mEq total) by mouth daily. 30 tablet 10  . rosuvastatin (CRESTOR) 20 MG tablet Take 1 tablet (20 mg total) by mouth daily. 30 tablet 11  . sertraline (ZOLOFT) 25 MG tablet take 1 tablet by mouth once daily (Patient taking differently: Take 25 mg by mouth at bedtime. take 1 tablet by mouth once daily) 90 tablet 3  . silver sulfADIAZINE (SILVADENE) 1 % cream Apply topically daily. 50 g 0  . torsemide (DEMADEX) 20 MG tablet Take 5 tablets (100 mg total) by mouth daily. (Patient taking differently: Take 20-100 mg by mouth daily as needed (for edema based on patientts weight). ) 270 tablet 3   No current facility-administered medications on file prior to visit.

## 2014-10-26 ENCOUNTER — Telehealth: Payer: Self-pay | Admitting: Cardiovascular Disease

## 2014-10-26 NOTE — Telephone Encounter (Signed)
New message      Talk to someone about torsemide and getting an order for oxygen to be used on a PRN basis

## 2014-10-26 NOTE — Telephone Encounter (Signed)
Spoke with Abigail Butts with [facility] who states she needs a verbal order regarding patient's Torsemide dose.  I reviewed patient's medication instructions of 20 - 100 mg daily based upon patient's weight and advised that patient's caregiver, Larena Glassman, has been the one to determine how many mg were given.  I advised her that patient's attending MD, Dr. Lenna Gilford, should be the one to give the order regarding the oxygen.  Abigail Butts verbalized understanding and agreement and I advised her to call back with questions or concerns.

## 2014-10-27 ENCOUNTER — Telehealth: Payer: Self-pay | Admitting: Pulmonary Disease

## 2014-10-27 NOTE — Telephone Encounter (Signed)
Agree with note by Michelle Swinyer, RN  

## 2014-10-27 NOTE — Telephone Encounter (Signed)
Spoke with Billy Morgan with Hospice  She states pt's sats are 99% ra at rest  She is needing order to change o2 from 3lpm 24/7 to PRN only  SN, please advise if this is okay, thanks

## 2014-10-28 NOTE — Telephone Encounter (Signed)
Spoke with Abigail Butts for Hospice in regards to needing a verbal to change pt order for O2 to PRN. Informed Abigail Butts that SN was ok with changing the order to prn since pt is sating at 99% on room air. No further action is needed at this time.

## 2014-12-07 ENCOUNTER — Telehealth: Payer: Self-pay | Admitting: Pulmonary Disease

## 2014-12-08 ENCOUNTER — Telehealth: Payer: Self-pay

## 2014-12-08 NOTE — Telephone Encounter (Signed)
On 12/08/2014 I received a death certificate from [facility] This patient is a patient of Doctor Lenna Gilford. I am taking the death certificate to the 2nd floor of  at [facility] for signature. On Jan 07, 2015 I received the death certificate back completed. I called the funeral home to let them know it was ready to be picked up.

## 2014-12-16 ENCOUNTER — Telehealth: Payer: Self-pay | Admitting: Pulmonary Disease

## 2014-12-16 NOTE — Telephone Encounter (Signed)
Apolonio Schneiders, have you received anything on pt from hospice? thanks

## 2014-12-17 NOTE — Telephone Encounter (Signed)
Called Billy Morgan and informed that we do have the orders but they will not be signed until Tuesday when SN returns

## 2014-12-23 NOTE — Telephone Encounter (Signed)
Called Amy, she is still waiting for the orders to be faxed to her.    Apolonio Schneiders - please fax orders and let Amy know when you have faxed it  Thanks.

## 2014-12-23 NOTE — Telephone Encounter (Signed)
[place], [phone]

## 2014-12-24 NOTE — Telephone Encounter (Signed)
Apolonio Schneiders, have you faxed the orders to Amy? If not we can fax them for you. Thanks.

## 2014-12-24 NOTE — Telephone Encounter (Signed)
Paperwork received and faxed back to hospice. Confirmation page received. Nothing further is needed.

## 2014-12-30 NOTE — Telephone Encounter (Signed)
FYI to Dr. Lenna Gilford Patient passed away 12/28/2014 at 2:31am.

## 2014-12-30 DEATH — deceased

## 2015-01-06 ENCOUNTER — Telehealth: Payer: Self-pay | Admitting: Pulmonary Disease

## 2015-01-06 NOTE — Telephone Encounter (Signed)
Received a call from Amy at [facility], she has 2 orders that need to be signed and faxed back to her.  She faxed them to [place].   Billy Morgan - do you have these orders?  Please advise

## 2015-01-08 NOTE — Telephone Encounter (Signed)
Billy Morgan - has this been done?  Can I close encounter? Please advise.

## 2015-01-08 NOTE — Telephone Encounter (Signed)
Orders signed by SN and faxed to hospice on 12/30/14 at 3:10pm. Nothing further is needed.

## 2015-08-23 IMAGING — CR DG CHEST 2V
2 series · 2 of 2 positions shown · non-contrast
Comparison: 06/11/2014

CLINICAL DATA: Shortness of breath.  CHF.  Hypertension.  Hypoxia.

EXAM:
CHEST  2 VIEW

[chest lat]
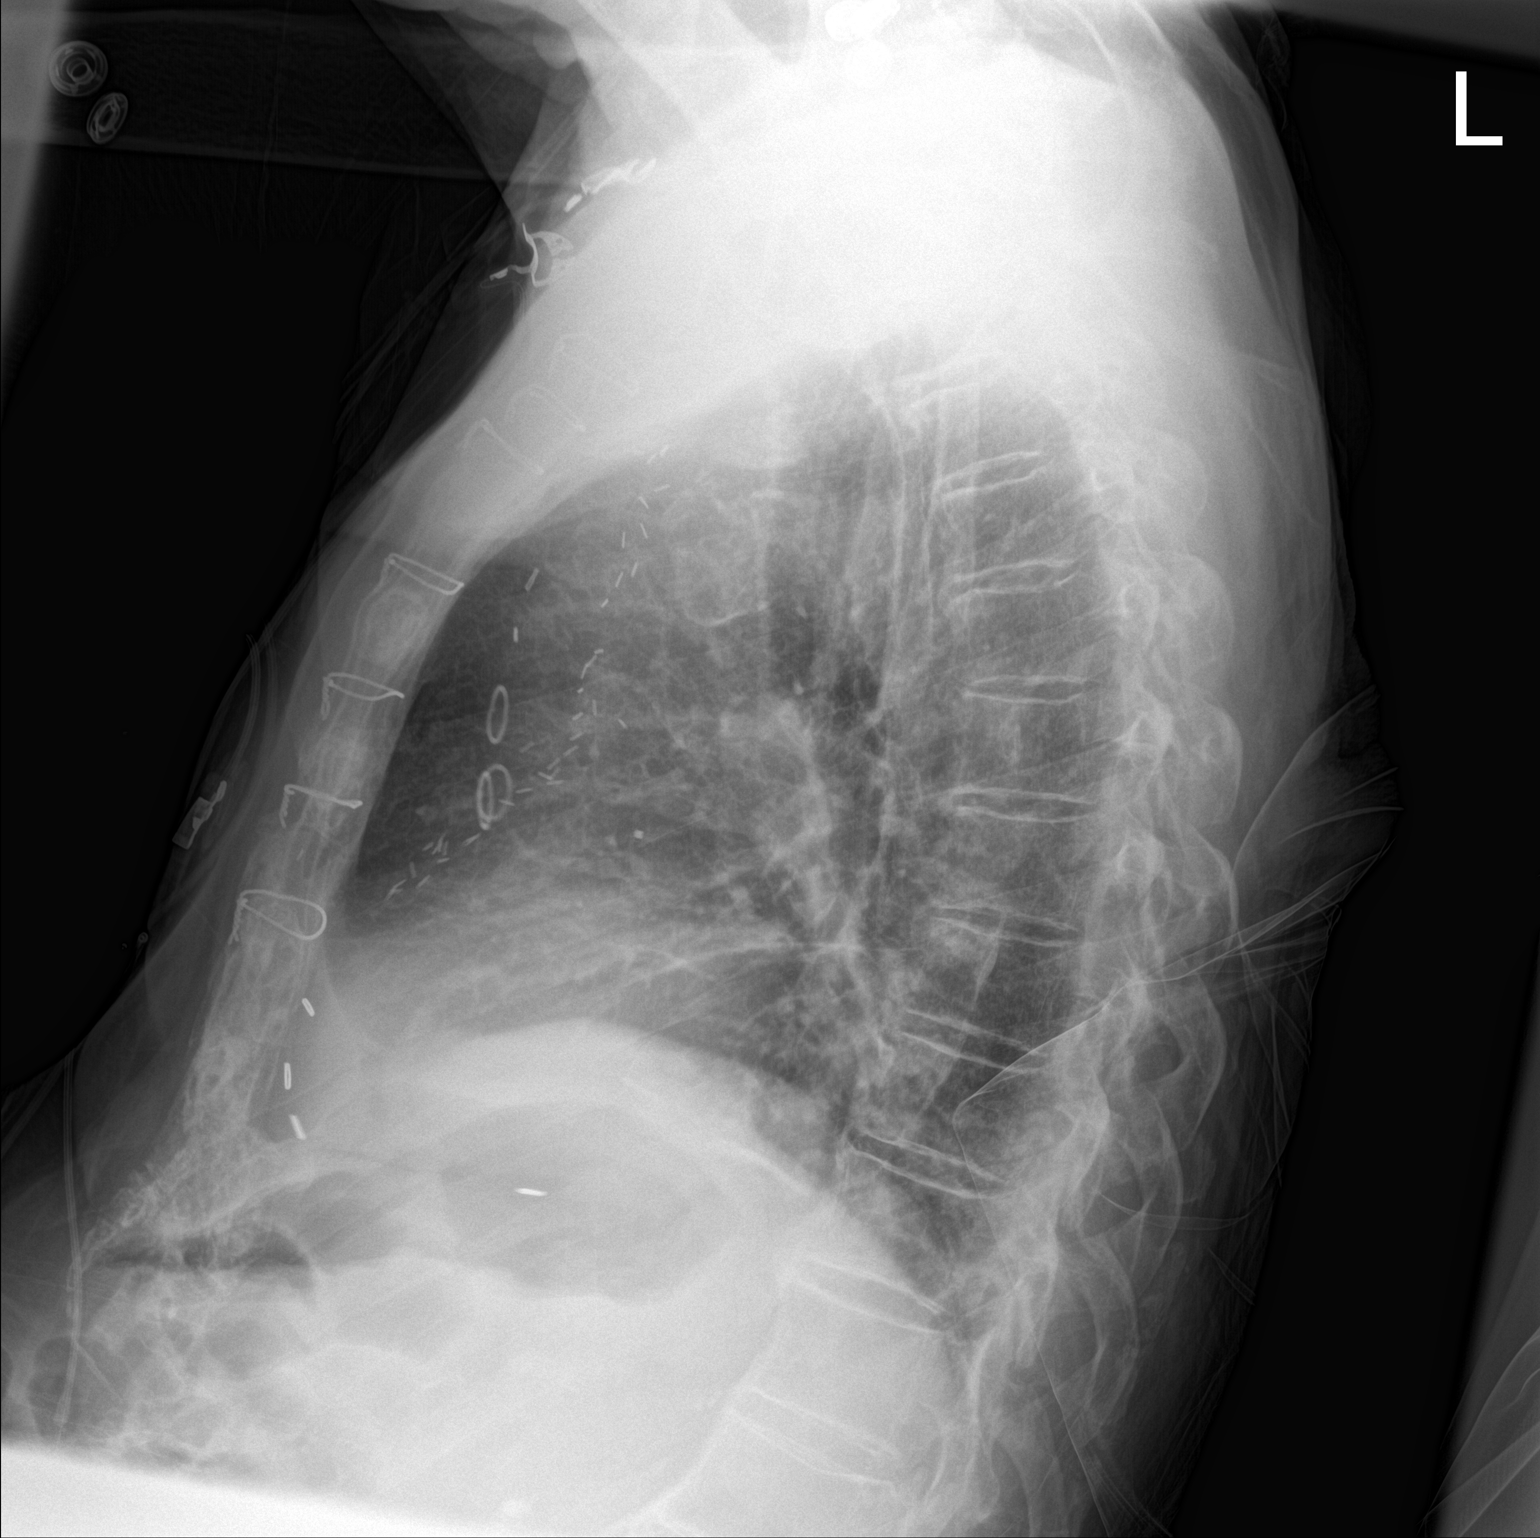

[chest ap]
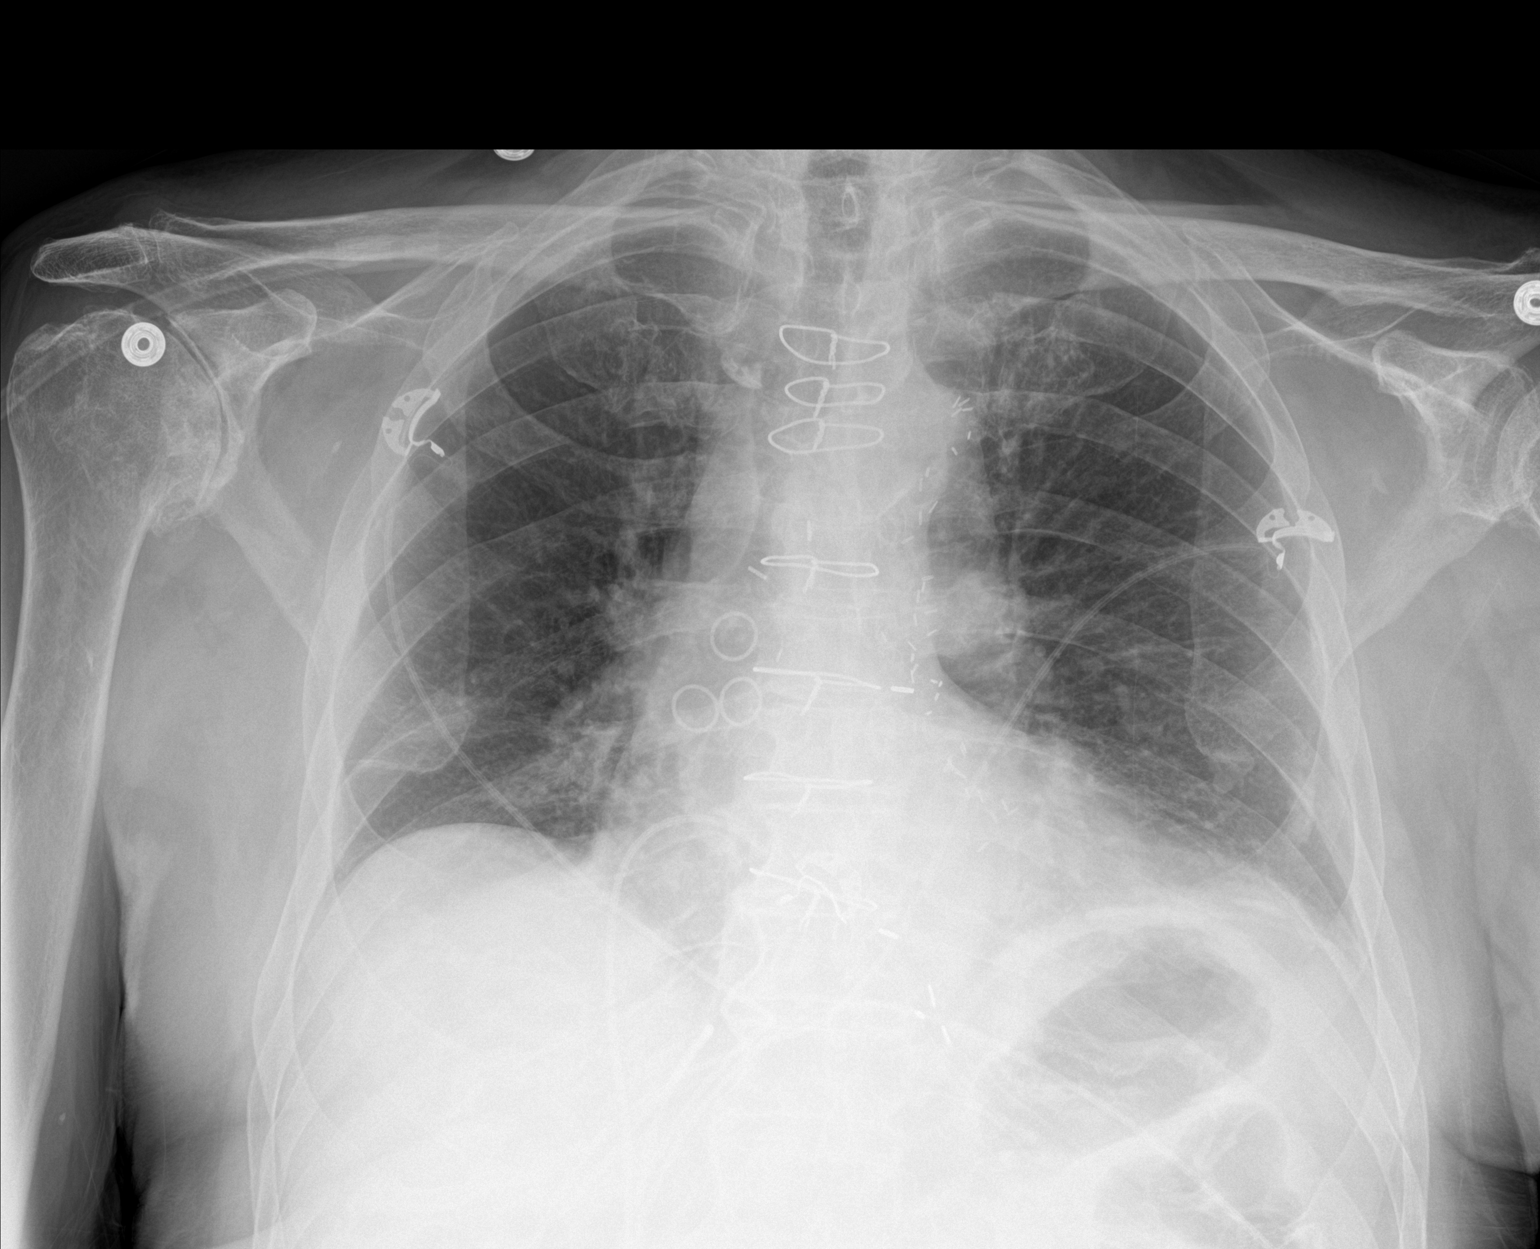

[2 of 2 positions shown; findings below may reference images not displayed]

FINDINGS: Postoperative changes in the mediastinum. Shallow inspiration with
atelectasis in the lung bases. Normal heart size and pulmonary
vascularity. Calcified pleural plaques suggesting prior asbestos
exposure. No focal airspace disease or consolidation in the lungs.
No blunting of costophrenic angles. No pneumothorax. Degenerative
changes in the spine and shoulders. No significant change since
prior study.
IMPRESSION: Shallow inspiration with atelectasis in the lung bases. Calcified
pleural plaques. No acute changes since prior study.
